# Patient Record
Sex: Female | Born: 1950
Health system: Southern US, Community
[De-identification: ages and names within clinical notes are randomized; demographics above are authoritative.]

## PROBLEM LIST (undated history)

## (undated) DIAGNOSIS — E785 Hyperlipidemia, unspecified: Secondary | ICD-10-CM

## (undated) DIAGNOSIS — K219 Gastro-esophageal reflux disease without esophagitis: Secondary | ICD-10-CM

## (undated) DIAGNOSIS — I10 Essential (primary) hypertension: Secondary | ICD-10-CM

## (undated) DIAGNOSIS — M797 Fibromyalgia: Secondary | ICD-10-CM

## (undated) DIAGNOSIS — M1990 Unspecified osteoarthritis, unspecified site: Secondary | ICD-10-CM

## (undated) DIAGNOSIS — T7840XA Allergy, unspecified, initial encounter: Secondary | ICD-10-CM

## (undated) HISTORY — DX: Hyperlipidemia, unspecified: E78.5

## (undated) HISTORY — DX: Unspecified osteoarthritis, unspecified site: M19.90

## (undated) HISTORY — DX: Gastro-esophageal reflux disease without esophagitis: K21.9

## (undated) HISTORY — PX: PARTIAL HYSTERECTOMY: SHX80

## (undated) HISTORY — PX: CARPAL TUNNEL RELEASE: SHX101

## (undated) HISTORY — PX: KNEE ARTHROSCOPY: SUR90

## (undated) HISTORY — PX: ROTATOR CUFF REPAIR: SHX139

## (undated) HISTORY — DX: Fibromyalgia: M79.7

## (undated) HISTORY — DX: Allergy, unspecified, initial encounter: T78.40XA

## (undated) HISTORY — DX: Essential (primary) hypertension: I10

---

## 1984-10-13 HISTORY — PX: ABDOMINAL HYSTERECTOMY: SHX81

## 2010-10-14 LAB — HM COLONOSCOPY: HM Colonoscopy: NORMAL

## 2013-10-13 HISTORY — PX: CERVICAL FUSION: SHX112

## 2014-03-13 LAB — HM MAMMOGRAPHY: HM Mammogram: NORMAL

## 2014-05-03 LAB — LIPID PANEL
CHOLESTEROL: 177 mg/dL (ref 0–200)
HDL: 65 mg/dL (ref 35–70)
LDL Cholesterol: 86 mg/dL
Triglycerides: 131 mg/dL (ref 40–160)

## 2014-05-03 LAB — TSH: TSH: 2.5 u[IU]/mL (ref <=5.90)

## 2015-02-15 DIAGNOSIS — Z01810 Encounter for preprocedural cardiovascular examination: Secondary | ICD-10-CM | POA: Insufficient documentation

## 2015-03-19 ENCOUNTER — Other Ambulatory Visit: Payer: Self-pay | Admitting: Internal Medicine

## 2015-03-19 ENCOUNTER — Encounter: Payer: Self-pay | Admitting: Internal Medicine

## 2015-03-19 DIAGNOSIS — I25119 Atherosclerotic heart disease of native coronary artery with unspecified angina pectoris: Secondary | ICD-10-CM | POA: Insufficient documentation

## 2015-03-19 DIAGNOSIS — M797 Fibromyalgia: Secondary | ICD-10-CM | POA: Insufficient documentation

## 2015-03-19 DIAGNOSIS — K219 Gastro-esophageal reflux disease without esophagitis: Secondary | ICD-10-CM | POA: Insufficient documentation

## 2015-03-19 DIAGNOSIS — I10 Essential (primary) hypertension: Secondary | ICD-10-CM | POA: Insufficient documentation

## 2015-03-19 DIAGNOSIS — Z981 Arthrodesis status: Secondary | ICD-10-CM | POA: Insufficient documentation

## 2015-03-19 DIAGNOSIS — E782 Mixed hyperlipidemia: Secondary | ICD-10-CM | POA: Insufficient documentation

## 2015-03-20 ENCOUNTER — Ambulatory Visit (INDEPENDENT_AMBULATORY_CARE_PROVIDER_SITE_OTHER): Payer: Medicare Other | Admitting: Internal Medicine

## 2015-03-20 ENCOUNTER — Other Ambulatory Visit: Payer: Self-pay | Admitting: Internal Medicine

## 2015-03-20 ENCOUNTER — Encounter: Payer: Self-pay | Admitting: Internal Medicine

## 2015-03-20 VITALS — BP 104/60 | HR 68 | Ht 62.0 in | Wt 164.8 lb

## 2015-03-20 DIAGNOSIS — R609 Edema, unspecified: Secondary | ICD-10-CM

## 2015-03-20 DIAGNOSIS — R600 Localized edema: Secondary | ICD-10-CM

## 2015-03-20 DIAGNOSIS — M797 Fibromyalgia: Secondary | ICD-10-CM | POA: Diagnosis not present

## 2015-03-20 DIAGNOSIS — M2550 Pain in unspecified joint: Secondary | ICD-10-CM | POA: Diagnosis not present

## 2015-03-20 DIAGNOSIS — I219 Acute myocardial infarction, unspecified: Secondary | ICD-10-CM | POA: Insufficient documentation

## 2015-03-20 DIAGNOSIS — Z9889 Other specified postprocedural states: Secondary | ICD-10-CM | POA: Insufficient documentation

## 2015-03-20 MED ORDER — TRIAMTERENE-HCTZ 37.5-25 MG PO TABS: 1.0000 | ORAL_TABLET | Freq: Every day | ORAL | Status: DC

## 2015-03-20 NOTE — Patient Instructions (Addendum)
DASH Eating Plan DASH stands for "Dietary Approaches to Stop Hypertension." The DASH eating plan is a healthy eating plan that has been shown to reduce high blood pressure (hypertension). Additional health benefits may include reducing the risk of type 2 diabetes mellitus, heart disease, and stroke. The DASH eating plan may also help with weight loss. WHAT DO I NEED TO KNOW ABOUT THE DASH EATING PLAN? For the DASH eating plan, you will follow these general guidelines:  Choose foods with a percent daily value for sodium of less than 5% (as listed on the food label).  Use salt-free seasonings or herbs instead of table salt or sea salt.  Check with your health care provider or pharmacist before using salt substitutes.  Eat lower-sodium products, often labeled as "lower sodium" or "no salt added."  Eat fresh foods.  Eat more vegetables, fruits, and low-fat dairy products.  Choose whole grains. Look for the word "whole" as the first word in the ingredient list.  Choose fish and skinless chicken or turkey more often than red meat. Limit fish, poultry, and meat to 6 oz (170 g) each day.  Limit sweets, desserts, sugars, and sugary drinks.  Choose heart-healthy fats.  Limit cheese to 1 oz (28 g) per day.  Eat more home-cooked food and less restaurant, buffet, and fast food.  Limit fried foods.  Cook foods using methods other than frying.  Limit canned vegetables. If you do use them, rinse them well to decrease the sodium.  When eating at a restaurant, ask that your food be prepared with less salt, or no salt if possible. WHAT FOODS CAN I EAT? Seek help from a dietitian for individual calorie needs. Grains Whole grain or whole wheat bread. Brown rice. Whole grain or whole wheat pasta. Quinoa, bulgur, and whole grain cereals. Low-sodium cereals. Corn or whole wheat flour tortillas. Whole grain cornbread. Whole grain crackers. Low-sodium crackers. Vegetables Fresh or frozen vegetables  (raw, steamed, roasted, or grilled). Low-sodium or reduced-sodium tomato and vegetable juices. Low-sodium or reduced-sodium tomato sauce and paste. Low-sodium or reduced-sodium canned vegetables.  Fruits All fresh, canned (in natural juice), or frozen fruits. Meat and Other Protein Products Ground beef (85% or leaner), grass-fed beef, or beef trimmed of fat. Skinless chicken or turkey. Ground chicken or turkey. Pork trimmed of fat. All fish and seafood. Eggs. Dried beans, peas, or lentils. Unsalted nuts and seeds. Unsalted canned beans. Dairy Low-fat dairy products, such as skim or 1% milk, 2% or reduced-fat cheeses, low-fat ricotta or cottage cheese, or plain low-fat yogurt. Low-sodium or reduced-sodium cheeses. Fats and Oils Tub margarines without trans fats. Light or reduced-fat mayonnaise and salad dressings (reduced sodium). Avocado. Safflower, olive, or canola oils. Natural peanut or almond butter. Other Unsalted popcorn and pretzels. The items listed above may not be a complete list of recommended foods or beverages. Contact your dietitian for more options. WHAT FOODS ARE NOT RECOMMENDED? Grains White bread. White pasta. White rice. Refined cornbread. Bagels and croissants. Crackers that contain trans fat. Vegetables Creamed or fried vegetables. Vegetables in a cheese sauce. Regular canned vegetables. Regular canned tomato sauce and paste. Regular tomato and vegetable juices. Fruits Dried fruits. Canned fruit in light or heavy syrup. Fruit juice. Meat and Other Protein Products Fatty cuts of meat. Ribs, chicken wings, bacon, sausage, bologna, salami, chitterlings, fatback, hot dogs, bratwurst, and packaged luncheon meats. Salted nuts and seeds. Canned beans with salt. Dairy Whole or 2% milk, cream, half-and-half, and cream cheese. Whole-fat or sweetened yogurt. Full-fat   cheeses or blue cheese. Nondairy creamers and whipped toppings. Processed cheese, cheese spreads, or cheese  curds. Condiments Onion and garlic salt, seasoned salt, table salt, and sea salt. Canned and packaged gravies. Worcestershire sauce. Tartar sauce. Barbecue sauce. Teriyaki sauce. Soy sauce, including reduced sodium. Steak sauce. Fish sauce. Oyster sauce. Cocktail sauce. Horseradish. Ketchup and mustard. Meat flavorings and tenderizers. Bouillon cubes. Hot sauce. Tabasco sauce. Marinades. Taco seasonings. Relishes. Fats and Oils Butter, stick margarine, lard, shortening, ghee, and bacon fat. Coconut, palm kernel, or palm oils. Regular salad dressings. Other Pickles and olives. Salted popcorn and pretzels. The items listed above may not be a complete list of foods and beverages to avoid. Contact your dietitian for more information. WHERE CAN I FIND MORE INFORMATION? National Heart, Lung, and Blood Institute: [employer] Document Released: 09/18/2011 Document Revised: 02/13/2014 Document Reviewed: 08/03/2013 [facility] Patient Information 2015 [place], [place]. This information is not intended to replace advice given to you by your health care provider. Make sure you discuss any questions you have with your health care provider.    I recommend elevating legs for 20 minutes at least twice a day to reduce edema.

## 2015-03-20 NOTE — Progress Notes (Signed)
Date:  03/20/2015   Name:  Jordan Baxter   DOB:  01/29/1951   MRN:  [id number]   Chief Complaint: Edema   History of Present Illness:  This is a 64 y.o. female who is presenting for edema.  She has been taking HCTZ for some time.  Now noticing edema in her hands first thing in the AM - it gets better as the day goes on and she moves around. She also has edema in both legs that gets worse as the day progresses.  Overnight her legs improve but the edema is never completely gone.  She denies a change in diet w/r to sodium.  She has been more active - doing housework and planting a garden.  She is on her feet all day - never takes a break to elevate.   Arthritis Presents for follow-up visit. She complains of pain, stiffness and joint swelling (in both hand upon awakening). She reports no joint warmth. Affected locations include the right foot, left foot, left knee, right knee, right hip and left hip. Pertinent negatives include no fever, rash or Raynaud's syndrome. Her past medical history is significant for chronic back pain (with previous diagnosis of fibromyalgia) and osteoarthritis. There is no history of rheumatoid arthritis.  Past treatments include an opioid, acetaminophen, exercise and activity. The treatment provided mild relief. Factors aggravating her arthritis include sitting. Compliance with prior treatments has been good. Compliance problems include medication side effects.   She has been prescribed multiple medications over the years for joint, neck pain and fibromyalgia but has not tolerated them due to side effects.  Currently on Tylenol #3 and #4 from pain management.  Review of Systems:  Review of Systems  Constitutional: Positive for activity change. Negative for fever and unexpected weight change.  HENT: Positive for dental problem.   Respiratory: Negative for cough, chest tightness, shortness of breath and wheezing.   Cardiovascular: Positive for leg swelling. Negative for chest  pain.  Gastrointestinal: Negative for abdominal pain.  Endocrine: Negative for polyuria.  Genitourinary: Negative.   Musculoskeletal: Positive for joint swelling (in both hand upon awakening), arthralgias, arthritis, stiffness, neck pain and neck stiffness.  Skin: Negative for rash.  Neurological: Negative.   Hematological: Negative.   Psychiatric/Behavioral: Negative for behavioral problems and dysphoric mood.    Patient Active Problem List   Diagnosis Date Noted  . H/O cardiac catheterization 03/20/2015  . Heart attack 03/20/2015  . CAD in native artery 03/19/2015  . Essential (primary) hypertension 03/19/2015  . Fibrositis 03/19/2015  . Gastro-esophageal reflux disease without esophagitis 03/19/2015  . Combined fat and carbohydrate induced hyperlipemia 03/19/2015  . H/O arthrodesis 03/19/2015    Prior to Admission medications   Medication Sig Start Date End Date Taking? Authorizing Provider  ACETAMINOPHEN-CODEINE #3 PO Take 1-2 tablets by mouth 3 (three) times daily as needed.   Yes Historical Provider, MD  acetaminophen-codeine (TYLENOL #4) 300-60 MG per tablet Take 1 tablet by mouth daily.   Yes Historical Provider, MD  aspirin 81 MG tablet Take 1 tablet by mouth daily.   Yes Historical Provider, MD  atorvastatin (LIPITOR) 80 MG tablet Take 1 tablet by mouth daily.   Yes Historical Provider, MD  Calcium-Vitamin D 600-200 MG-UNIT per tablet Take 1 tablet by mouth 2 (two) times daily.   Yes Historical Provider, MD  clopidogrel (PLAVIX) 75 MG tablet Take 1 tablet by mouth daily.    Historical Provider, MD  cyclobenzaprine (AMRIX) 15 MG 24 hr capsule Take 1 tablet  by mouth daily as needed.    Historical Provider, MD  isosorbide mononitrate (IMDUR) 30 MG 24 hr tablet Take 30 mg by mouth daily. 12/28/14   Historical Provider, MD  metoprolol tartrate (LOPRESSOR) 25 MG tablet Take 0.5 tablets by mouth 2 (two) times daily.   Yes Historical Provider, MD  nitroGLYCERIN (NITROSTAT) 0.4 MG  SL tablet Place 1 tablet under the tongue daily as needed.   Yes Historical Provider, MD  omeprazole (PRILOSEC) 20 MG capsule Take 1 capsule by mouth daily. 02/21/15  Yes Historical Provider, MD  Omeprazole 20 MG TBEC Take 1 tablet by mouth daily. 01/24/15 01/24/16  Historical Provider, MD  pantoprazole (PROTONIX) 40 MG tablet Take 1 tablet by mouth daily.    Historical Provider, MD  tiZANidine (ZANAFLEX) 2 MG tablet Take 2 mg by mouth at bedtime. 02/23/15  Yes Historical Provider, MD  traZODone (DESYREL) 50 MG tablet Take 1 tablet by mouth at bedtime.   Yes Historical Provider, MD  triamterene-hydrochlorothiazide (DYAZIDE) 37.5-25 MG per capsule Take 1 capsule by mouth daily.   Yes Historical Provider, MD    Allergies  Allergen Reactions  . Cephalosporins   . Ibuprofen Nausea Only  . Penicillins     Past Surgical History  Procedure Laterality Date  . Cervical fusion  2015  . Partial hysterectomy    . Rotator cuff repair Left   . Carpal tunnel release Right   . Knee arthroscopy Right     History  Substance Use Topics  . Smoking status: Former Smoker    Quit date: 03/13/2012  . Smokeless tobacco: Not on file  . Alcohol Use: No     Medication list has been reviewed and updated.  Physical Examination:  Physical Exam  Constitutional: She is oriented to person, place, and time. She appears well-developed. No distress.  Neck: Normal range of motion. Neck supple. No thyromegaly present.  Cardiovascular: Normal rate, regular rhythm and normal heart sounds.   Pulmonary/Chest: Effort normal and breath sounds normal. No respiratory distress. She has no wheezes.  Musculoskeletal: Normal range of motion. She exhibits edema (1+ to mid tibia bilaterally) and tenderness (both hands and digits).  Lymphadenopathy:    She has no cervical adenopathy.  Neurological: She is alert and oriented to person, place, and time. She has normal strength.  Reflex Scores:      Patellar reflexes are 1+ on  the right side and 1+ on the left side.      Achilles reflexes are 1+ on the right side and 1+ on the left side. Skin: Skin is warm and dry. No rash noted.  Psychiatric: She has a normal mood and affect. Her behavior is normal. Thought content normal.    BP 104/60 mmHg  Pulse 68  Ht 5' 2"  (1.575 m)  Wt 164 lb 12.8 oz (74.753 kg)  BMI 30.13 kg/m2  Assessment and Plan: 1. Fibrositis Continue acitivites as tolerated.  Continue plain tylenol as needed  2. Edema extremities Will increase maxzide to one whole tablet daily Reinforced free water intake and reduced sodium diet Elevated legs 20 minutes at least twice a day - TSH - triamterene-hydrochlorothiazide (MAXZIDE-25) 37.5-25 MG per tablet; Take 1 tablet by mouth daily.  Dispense: 90 tablet; Refill: 3  3. Joint pain Screening labs  - Rheumatoid Factor - Sed Rate (ESR) - ANA w/Reflex if Positive   Halina Maidens, MD [facility]  03/20/2015

## 2015-03-21 LAB — TSH: TSH: 1.46 u[IU]/mL (ref 0.450–4.500)

## 2015-03-21 LAB — ANA W/REFLEX IF POSITIVE: ANA: NEGATIVE

## 2015-03-21 LAB — SEDIMENTATION RATE: Sed Rate: 4 mm/hr (ref 0–40)

## 2015-03-21 LAB — RHEUMATOID FACTOR

## 2015-05-31 ENCOUNTER — Encounter: Payer: Self-pay | Admitting: Family Medicine

## 2015-05-31 ENCOUNTER — Ambulatory Visit: Payer: Medicare Other | Admitting: Family Medicine

## 2015-05-31 ENCOUNTER — Ambulatory Visit (INDEPENDENT_AMBULATORY_CARE_PROVIDER_SITE_OTHER): Payer: Medicare Other | Admitting: Family Medicine

## 2015-05-31 VITALS — BP 100/60 | HR 72 | Temp 97.8°F | Ht 62.0 in | Wt 166.8 lb

## 2015-05-31 DIAGNOSIS — J029 Acute pharyngitis, unspecified: Secondary | ICD-10-CM

## 2015-05-31 LAB — POCT RAPID STREP A (OFFICE): Rapid Strep A Screen: NEGATIVE

## 2015-05-31 NOTE — Progress Notes (Signed)
Date:  05/31/2015   Name:  Jordan Baxter   DOB:  [birth year]   MRN:  [id number]  PCP:  Bari Edward, MD    Chief Complaint: Sore Throat and Cough   History of Present Illness:  This is a 64 y.o. female with 3d hx of ST, NP cough, LG fever, rhinorrhea last week. Denies otalgia. Watching grandchildren lately with colds.  Review of Systems:  Review of Systems  HENT: Negative for ear pain, facial swelling, hearing loss, sinus pressure and trouble swallowing.   Respiratory: Negative for shortness of breath.     Patient Active Problem List   Diagnosis Date Noted  . H/O cardiac catheterization 03/20/2015  . Heart attack 03/20/2015  . CAD in native artery 03/19/2015  . Essential (primary) hypertension 03/19/2015  . Fibrositis 03/19/2015  . Gastro-esophageal reflux disease without esophagitis 03/19/2015  . Combined fat and carbohydrate induced hyperlipemia 03/19/2015  . H/O arthrodesis 03/19/2015  . Encounter for preprocedural cardiovascular examination 02/15/2015    Prior to Admission medications   Medication Sig Start Date End Date Taking? Authorizing Provider  aspirin 81 MG tablet Take 1 tablet by mouth daily.   Yes Historical Provider, MD  atorvastatin (LIPITOR) 80 MG tablet Take 1 tablet by mouth daily.   Yes Historical Provider, MD  Calcium-Vitamin D 600-200 MG-UNIT per tablet Take 1 tablet by mouth 2 (two) times daily.   Yes Historical Provider, MD  metoprolol tartrate (LOPRESSOR) 25 MG tablet Take 0.5 tablets by mouth 2 (two) times daily.   Yes Historical Provider, MD  nitroGLYCERIN (NITROSTAT) 0.4 MG SL tablet Place 1 tablet under the tongue daily as needed.   Yes Historical Provider, MD  omeprazole (PRILOSEC) 20 MG capsule Take 1 capsule by mouth daily. 02/21/15  Yes Historical Provider, MD  tiZANidine (ZANAFLEX) 2 MG tablet Take 2 mg by mouth at bedtime. 02/23/15  Yes Historical Provider, MD  traZODone (DESYREL) 50 MG tablet Take 1 tablet by mouth at bedtime.   Yes Historical  Provider, MD  triamterene-hydrochlorothiazide (MAXZIDE-25) 37.5-25 MG per tablet Take 1 tablet by mouth daily. 03/20/15  Yes Reubin Milan, MD  acetaminophen-codeine (TYLENOL #4) 300-60 MG per tablet Take 1-2 tablets by mouth 3 (three) times daily as needed. 05/14/15   Historical Provider, MD  cyclobenzaprine (AMRIX) 15 MG 24 hr capsule Take 1 tablet by mouth daily as needed.    Historical Provider, MD    Allergies  Allergen Reactions  . Cephalosporins   . Ibuprofen Nausea Only  . Penicillins     Past Surgical History  Procedure Laterality Date  . Cervical fusion  2015  . Partial hysterectomy    . Rotator cuff repair Left   . Carpal tunnel release Right   . Knee arthroscopy Right     Social History  Substance Use Topics  . Smoking status: Former Smoker    Quit date: 03/13/2012  . Smokeless tobacco: None  . Alcohol Use: No    Family History  Problem Relation Age of Onset  . CAD Mother     Medication list has been reviewed and updated.  Physical Examination: BP 100/60 mmHg  Pulse 72  Temp(Src) 97.8 F (36.6 C)  Ht  (1.575 m)  Wt 166 lb 12.8 oz (75.66 kg)  BMI 30.50 kg/m2  SpO2 96%  Physical Exam  Constitutional: She appears well-developed and well-nourished.  HENT:  Head: Normocephalic and atraumatic.  Right Ear: External ear normal.  Left Ear: External ear normal.  OP mild erythema no  exudate  Pulmonary/Chest: Effort normal and breath sounds normal.  Lymphadenopathy:    She has no cervical adenopathy.    Assessment and Plan:  1. Pharyngitis RST negative, likely viral, rec fluids, lozenges, rest, should improve within one week - POCT rapid strep A  Return if symptoms worsen or fail to improve.  Dionne Ano. Kingsley Spittle MD [facility]  05/31/2015

## 2015-06-14 ENCOUNTER — Encounter: Payer: Medicare Other | Admitting: Internal Medicine

## 2015-08-22 ENCOUNTER — Other Ambulatory Visit: Payer: Self-pay | Admitting: Internal Medicine

## 2015-08-22 ENCOUNTER — Ambulatory Visit (INDEPENDENT_AMBULATORY_CARE_PROVIDER_SITE_OTHER): Payer: Medicare Other | Admitting: Internal Medicine

## 2015-08-22 ENCOUNTER — Encounter: Payer: Self-pay | Admitting: Internal Medicine

## 2015-08-22 VITALS — BP 100/60 | HR 64 | Ht 61.0 in | Wt 164.2 lb

## 2015-08-22 DIAGNOSIS — E782 Mixed hyperlipidemia: Secondary | ICD-10-CM

## 2015-08-22 DIAGNOSIS — Z1239 Encounter for other screening for malignant neoplasm of breast: Secondary | ICD-10-CM | POA: Diagnosis not present

## 2015-08-22 DIAGNOSIS — M797 Fibromyalgia: Secondary | ICD-10-CM | POA: Diagnosis not present

## 2015-08-22 DIAGNOSIS — K219 Gastro-esophageal reflux disease without esophagitis: Secondary | ICD-10-CM | POA: Diagnosis not present

## 2015-08-22 DIAGNOSIS — Z0000 Encounter for general adult medical examination without abnormal findings: Secondary | ICD-10-CM

## 2015-08-22 DIAGNOSIS — I25119 Atherosclerotic heart disease of native coronary artery with unspecified angina pectoris: Secondary | ICD-10-CM | POA: Diagnosis not present

## 2015-08-22 DIAGNOSIS — I10 Essential (primary) hypertension: Secondary | ICD-10-CM | POA: Diagnosis not present

## 2015-08-22 LAB — POCT URINALYSIS DIPSTICK
BILIRUBIN UA: NEGATIVE
Blood, UA: NEGATIVE
GLUCOSE UA: NEGATIVE
KETONES UA: NEGATIVE
LEUKOCYTES UA: NEGATIVE
Nitrite, UA: NEGATIVE
Protein, UA: NEGATIVE
SPEC GRAV UA: 1.015
Urobilinogen, UA: 0.2
pH, UA: 6.5

## 2015-08-22 NOTE — Progress Notes (Signed)
Patient: Jordan Baxter, Female    DOB: 08/22/1951, 64 y.o.   MRN: [id number] Visit Date: 08/22/2015  Today's Provider: Bari Edward, MD   Chief Complaint  Patient presents with  . Medicare Wellness   Subjective:    Annual wellness visit Jordan Baxter is a 64 y.o. female who presents today for her Subsequent Annual Wellness Visit. She feels poorly due to chronic pain. She reports exercising some doing yardwork. She reports she is sleeping fairly well. She denies breast problems.  She is due for a Mammogram.  She does not need a Pap smear due to hysterectomy.  ----------------------------------------------------------- Back Pain This is a chronic problem. The current episode started more than 1 year ago. Associated symptoms include abdominal pain (reflux) and chest pain. Pertinent negatives include no dysuria, fever, headaches or pelvic pain. Treatments tried: She is under the care of pain management. She's had a steroid injection in her back which was not of benefit. A recent MRI suggested degenerative disease at another level. Another steroid injection will be attempted. If helpful, she may consider surgery.  Hypertension This is a chronic problem. The current episode started more than 1 year ago. The problem is unchanged. The problem is controlled. Associated symptoms include chest pain. Pertinent negatives include no headaches, palpitations or shortness of breath. There are no associated agents to hypertension. Risk factors for coronary artery disease include dyslipidemia. Past treatments include beta blockers and diuretics.  Hyperlipidemia This is a chronic problem. The current episode started more than 1 year ago. The problem is controlled. Recent lipid tests were reviewed and are normal. Associated symptoms include chest pain and myalgias. Pertinent negatives include no shortness of breath. Current antihyperlipidemic treatment includes statins. The current treatment provides significant  improvement of lipids.  Gastroesophageal Reflux She complains of abdominal pain (reflux) and chest pain. She reports no wheezing. This is a recurrent problem. The current episode started more than 1 year ago. The problem occurs occasionally. The problem has been unchanged. Pertinent negatives include no fatigue. She has tried a PPI for the symptoms. The treatment provided moderate (She has intermittent chest discomfort that she's not sure is either reflux or possibly angina. She takes omeprazole 20 daily. She has nitroglycerin but has not used it for chest pain) relief.   coronary artery disease - patient has a history of MI and stent placement. She is on aspirin, atorvastatin, and metoprolol. She has intermittent chest discomfort which she does not distinguish from reflux. She has not taken nitroglycerin when this pain occurs.  Review of Systems  Constitutional: Negative for fever, chills, appetite change, fatigue and unexpected weight change.  HENT: Negative for congestion, hearing loss, tinnitus, trouble swallowing and voice change.   Eyes: Negative for visual disturbance.  Respiratory: Positive for chest tightness. Negative for shortness of breath and wheezing.   Cardiovascular: Positive for chest pain. Negative for palpitations and leg swelling.  Gastrointestinal: Positive for abdominal pain (reflux). Negative for diarrhea and constipation.  Genitourinary: Positive for frequency. Negative for dysuria, urgency, hematuria, vaginal bleeding, vaginal discharge and pelvic pain.  Musculoskeletal: Positive for myalgias, back pain, arthralgias and neck stiffness.  Skin: Negative for color change and rash.  Allergic/Immunologic: Negative for environmental allergies and food allergies.  Neurological: Negative for syncope, light-headedness and headaches.  Hematological: Negative for adenopathy. Does not bruise/bleed easily.  Psychiatric/Behavioral: Positive for sleep disturbance. Negative for confusion  and decreased concentration.    Social History   Social History  . Marital Status: Unknown    Spouse  Name: N/A  . Number of Children: N/A  . Years of Education: N/A   Occupational History  . Not on file.   Social History Main Topics  . Smoking status: Former Smoker    Quit date: 03/13/2012  . Smokeless tobacco: Not on file  . Alcohol Use: No  . Drug Use: No  . Sexual Activity: Not on file   Other Topics Concern  . Not on file   Social History Narrative    Patient Active Problem List   Diagnosis Date Noted  . H/O cardiac catheterization 03/20/2015  . Heart attack (HCC) 03/20/2015  . CAD in native artery 03/19/2015  . Essential (primary) hypertension 03/19/2015  . Fibrositis 03/19/2015  . Gastro-esophageal reflux disease without esophagitis 03/19/2015  . Combined fat and carbohydrate induced hyperlipemia 03/19/2015  . H/O arthrodesis 03/19/2015    Past Surgical History  Procedure Laterality Date  . Cervical fusion  2015  . Partial hysterectomy    . Rotator cuff repair Left   . Carpal tunnel release Right   . Knee arthroscopy Right     Her family history includes CAD in her mother.    Previous Medications   ACETAMINOPHEN-CODEINE (TYLENOL #4) 300-60 MG PER TABLET    Take 1-2 tablets by mouth 3 (three) times daily as needed.   ASPIRIN 81 MG TABLET    Take 1 tablet by mouth daily.   ATORVASTATIN (LIPITOR) 80 MG TABLET    Take 1 tablet by mouth daily.   CALCIUM-VITAMIN D 600-200 MG-UNIT PER TABLET    Take 1 tablet by mouth 2 (two) times daily.   METOPROLOL TARTRATE (LOPRESSOR) 25 MG TABLET    Take 0.5 tablets by mouth 2 (two) times daily.   NITROGLYCERIN (NITROSTAT) 0.4 MG SL TABLET    Place 1 tablet under the tongue daily as needed.   OMEPRAZOLE (PRILOSEC) 20 MG CAPSULE    Take 1 capsule by mouth daily.   TIZANIDINE (ZANAFLEX) 4 MG TABLET    Take by mouth.   TRAZODONE (DESYREL) 50 MG TABLET    Take 1 tablet by mouth at bedtime.   TRIAMTERENE-HYDROCHLOROTHIAZIDE  (MAXZIDE-25) 37.5-25 MG PER TABLET    Take 1 tablet by mouth daily.    Patient Care Team: Reubin Milan, MD as PCP - General (Family Medicine)     Objective:   Vitals: BP 100/60 mmHg  Pulse 64  Ht  (1.549 m)  Wt 164 lb 3.2 oz (74.481 kg)  BMI 31.04 kg/m2  Physical Exam  Constitutional: She is oriented to person, place, and time. She appears well-developed and well-nourished. She appears distressed (acutely uncomfortable to movement).  HENT:  Head: Normocephalic and atraumatic.  Right Ear: Tympanic membrane and ear canal normal.  Left Ear: Tympanic membrane and ear canal normal.  Nose: Right sinus exhibits no maxillary sinus tenderness. Left sinus exhibits no maxillary sinus tenderness.  Mouth/Throat: Uvula is midline and oropharynx is clear and moist.  Eyes: Conjunctivae and EOM are normal. Right eye exhibits no discharge. Left eye exhibits no discharge. No scleral icterus.  Neck: Normal range of motion. Carotid bruit is not present. No erythema present. No thyromegaly present.  Cardiovascular: Normal rate, regular rhythm, normal heart sounds and normal pulses.   Pulmonary/Chest: Effort normal. No respiratory distress. She has no wheezes. Right breast exhibits no mass, no nipple discharge, no skin change and no tenderness. Left breast exhibits no mass, no nipple discharge, no skin change and no tenderness.  Abdominal: Soft. Bowel sounds are normal.  There is no hepatosplenomegaly. There is no tenderness. There is no CVA tenderness.  Musculoskeletal: She exhibits no edema.       Cervical back: She exhibits tenderness, pain and spasm.       Lumbar back: She exhibits tenderness, pain and spasm.  Lymphadenopathy:    She has no cervical adenopathy.    She has no axillary adenopathy.  Neurological: She is alert and oriented to person, place, and time. She has normal reflexes. No cranial nerve deficit or sensory deficit.  Skin: Skin is warm, dry and intact. No rash noted.   Psychiatric: She has a normal mood and affect. Her speech is normal and behavior is normal. Thought content normal.  Nursing note and vitals reviewed.   Activities of Daily Living In your present state of health, do you have any difficulty performing the following activities: 03/20/2015  Hearing? N  Vision? Y  Difficulty concentrating or making decisions? N  Walking or climbing stairs? Y  Dressing or bathing? Y  Doing errands, shopping? N    Fall Risk Assessment Fall Risk  08/22/2015  Falls in the past year? No     Patient reports there are safety devices in place in shower at home.   Depression Screen PHQ 2/9 Scores 08/22/2015  PHQ - 2 Score 0    Cognitive Testing - 6-CIT   Correct? Score   What year is it? yes 0 Yes = 0    No = 4  What month is it? yes 0 Yes = 0    No = 3  Remember:     [place], [address], [place]     What time is it? yes 0 Yes = 0    No = 3  Count backwards from 20 to 1 yes 0 Correct = 0    1 error = 2   More than 1 error = 4  Say the months of the year in reverse. yes 0 Correct = 0    1 error = 2   More than 1 error = 4  What address did I ask you to remember? yes 0 Correct = 0  1 error = 2    2 error = 4    3 error = 6    4 error = 8    All wrong = 10       TOTAL SCORE  0/28   Interpretation:  Normal  Normal (0-7) Abnormal (8-28)        Assessment & Plan:     Annual Wellness Visit  Reviewed patient's Family Medical History Reviewed and updated list of patient's medical providers Assessment of cognitive impairment was done Assessed patient's functional ability Established a written schedule for health screening services Health Risk Assessent Completed and Reviewed  Exercise Activities and Dietary recommendations Goals    None      Immunization History  Administered Date(s) Administered  . Influenza-Unspecified 07/22/2015  . Pneumococcal Polysaccharide-23 11/13/2009  . Tdap 05/03/2014  . Zoster 10/15/2011    Health  Maintenance  Topic Date Due  . Hepatitis C Screening  Sep 04, 1951  . HIV Screening  05/03/1966  . PAP SMEAR  05/03/1972  . INFLUENZA VACCINE  05/14/2015  . MAMMOGRAM  03/13/2016  . COLONOSCOPY  10/14/2020  . TETANUS/TDAP  05/03/2024  . ZOSTAVAX  Completed     Discussed health benefits of physical activity, and encouraged her to engage in regular exercise appropriate for her age and condition.    ------------------------------------------------------------------------------------------------------------  1. Medicare annual wellness visit, subsequent Medicare wellness measures are satisfied - POCT urinalysis dipstick  2. Essential (primary) hypertension Controlled on current medication - CBC with Differential/Platelet - Comprehensive metabolic panel - TSH  3. Gastro-esophageal reflux disease without esophagitis May need to increase the dose of omeprazole if symptoms continue  4. Fibrositis Chronic stable symptoms; followed by the pain clinic  5. Combined fat and carbohydrate induced hyperlipemia On statin therapy - Lipid panel  6. Breast cancer screening Patient will schedule mammogram in [place]  7. Coronary artery disease involving native coronary artery of native heart with angina pectoris (HCC) Continue aspirin and atorvastatin along with metoprolol Patient is encouraged to take sublingual nitroglycerin when she has substernal chest pains and follow up with cardiology if worsening  Bari Edward, MD [facility]  08/22/2015

## 2015-08-22 NOTE — Patient Instructions (Addendum)
Health Maintenance  Topic Date Due  . Hepatitis C Screening  1951-01-18  . HIV Screening  05/03/1966  . PAP SMEAR  05/03/1972  . MAMMOGRAM  03/13/2016  . INFLUENZA VACCINE  05/13/2016  . COLONOSCOPY  10/14/2020  . TETANUS/TDAP  05/03/2024  . ZOSTAVAX  Completed   Breast Self-Awareness Practicing breast self-awareness may pick up problems early, prevent significant medical complications, and possibly save your life. By practicing breast self-awareness, you can become familiar with how your breasts look and feel and if your breasts are changing. This allows you to notice changes early. It can also offer you some reassurance that your breast health is good. One way to learn what is normal for your breasts and whether your breasts are changing is to do a breast self-exam. If you find a lump or something that was not present in the past, it is best to contact your caregiver right away. Other findings that should be evaluated by your caregiver include nipple discharge, especially if it is bloody; skin changes or reddening; areas where the skin seems to be pulled in (retracted); or new lumps and bumps. Breast pain is seldom associated with cancer (malignancy), but should also be evaluated by a caregiver. HOW TO PERFORM A BREAST SELF-EXAM The best time to examine your breasts is 5-7 days after your menstrual period is over. During menstruation, the breasts are lumpier, and it may be more difficult to pick up changes. If you do not menstruate, have reached menopause, or had your uterus removed (hysterectomy), you should examine your breasts at regular intervals, such as monthly. If you are breastfeeding, examine your breasts after a feeding or after using a breast pump. Breast implants do not decrease the risk for lumps or tumors, so continue to perform breast self-exams as recommended. Talk to your caregiver about how to determine the difference between the implant and breast tissue. Also, talk about the  amount of pressure you should use during the exam. Over time, you will become more familiar with the variations of your breasts and more comfortable with the exam. A breast self-exam requires you to remove all your clothes above the waist. 1. Look at your breasts and nipples. Stand in front of a mirror in a room with good lighting. With your hands on your hips, push your hands firmly downward. Look for a difference in shape, contour, and size from one breast to the other (asymmetry). Asymmetry includes puckers, dips, or bumps. Also, look for skin changes, such as reddened or scaly areas on the breasts. Look for nipple changes, such as discharge, dimpling, repositioning, or redness. 2. Carefully feel your breasts. This is best done either in the shower or tub while using soapy water or when flat on your back. Place the arm (on the side of the breast you are examining) above your head. Use the pads (not the fingertips) of your three middle fingers on your opposite hand to feel your breasts. Start in the underarm area and use  inch (2 cm) overlapping circles to feel your breast. Use 3 different levels of pressure (light, medium, and firm pressure) at each circle before moving to the next circle. The light pressure is needed to feel the tissue closest to the skin. The medium pressure will help to feel breast tissue a little deeper, while the firm pressure is needed to feel the tissue close to the ribs. Continue the overlapping circles, moving downward over the breast until you feel your ribs below your breast. Then,  move one finger-width towards the center of the body. Continue to use the  inch (2 cm) overlapping circles to feel your breast as you move slowly up toward the collar bone (clavicle) near the base of the neck. Continue the up and down exam using all 3 pressures until you reach the middle of the chest. Do this with each breast, carefully feeling for lumps or changes. 3.  Keep a written record with  breast changes or normal findings for each breast. By writing this information down, you do not need to depend only on memory for size, tenderness, or location. Write down where you are in your menstrual cycle, if you are still menstruating. Breast tissue can have some lumps or thick tissue. However, see your caregiver if you find anything that concerns you.  SEEK MEDICAL CARE IF:  You see a change in shape, contour, or size of your breasts or nipples.   You see skin changes, such as reddened or scaly areas on the breasts or nipples.   You have an unusual discharge from your nipples.   You feel a new lump or unusually thick areas.    This information is not intended to replace advice given to you by your health care provider. Make sure you discuss any questions you have with your health care provider.   Document Released: 09/29/2005 Document Revised: 09/15/2012 Document Reviewed: 01/14/2012 Elsevier Interactive Patient Education [employer]

## 2015-08-23 LAB — COMPREHENSIVE METABOLIC PANEL
A/G RATIO: 1.8 (ref 1.1–2.5)
ALT: 19 IU/L (ref 0–32)
AST: 18 IU/L (ref 0–40)
Albumin: 4.2 g/dL (ref 3.6–4.8)
Alkaline Phosphatase: 69 IU/L (ref 39–117)
BILIRUBIN TOTAL: 0.4 mg/dL (ref 0.0–1.2)
BUN/Creatinine Ratio: 22 (ref 11–26)
BUN: 17 mg/dL (ref 8–27)
CO2: 27 mmol/L (ref 18–29)
Calcium: 9.8 mg/dL (ref 8.7–10.3)
Chloride: 97 mmol/L (ref 97–106)
Creatinine, Ser: 0.78 mg/dL (ref 0.57–1.00)
GFR, EST AFRICAN AMERICAN: 93 mL/min/{1.73_m2} (ref >59)
GFR, EST NON AFRICAN AMERICAN: 81 mL/min/{1.73_m2} (ref >59)
Globulin, Total: 2.4 g/dL (ref 1.5–4.5)
Glucose: 78 mg/dL (ref 65–99)
POTASSIUM: 5.2 mmol/L (ref 3.5–5.2)
Sodium: 140 mmol/L (ref 136–144)
TOTAL PROTEIN: 6.6 g/dL (ref 6.0–8.5)

## 2015-08-23 LAB — CBC WITH DIFFERENTIAL/PLATELET
BASOS: 1 %
Basophils Absolute: 0.1 10*3/uL (ref 0.0–0.2)
EOS (ABSOLUTE): 0.2 10*3/uL (ref 0.0–0.4)
EOS: 4 %
Hematocrit: 39.9 % (ref 34.0–46.6)
Hemoglobin: 13.4 g/dL (ref 11.1–15.9)
IMMATURE GRANS (ABS): 0 10*3/uL (ref 0.0–0.1)
Immature Granulocytes: 0 %
Lymphocytes Absolute: 2.6 10*3/uL (ref 0.7–3.1)
Lymphs: 49 %
MCH: 29.3 pg (ref 26.6–33.0)
MCHC: 33.6 g/dL (ref 31.5–35.7)
MCV: 87 fL (ref 79–97)
MONOS ABS: 0.5 10*3/uL (ref 0.1–0.9)
Monocytes: 9 %
NEUTROS ABS: 2 10*3/uL (ref 1.4–7.0)
NEUTROS PCT: 37 %
Platelets: 316 10*3/uL (ref 150–379)
RBC: 4.58 x10E6/uL (ref 3.77–5.28)
RDW: 14.3 % (ref 12.3–15.4)
WBC: 5.3 10*3/uL (ref 3.4–10.8)

## 2015-08-23 LAB — LIPID PANEL
CHOLESTEROL TOTAL: 149 mg/dL (ref 100–199)
Chol/HDL Ratio: 2.4 ratio units (ref 0.0–4.4)
HDL: 62 mg/dL (ref >39)
LDL CALC: 68 mg/dL (ref 0–99)
Triglycerides: 93 mg/dL (ref 0–149)
VLDL Cholesterol Cal: 19 mg/dL (ref 5–40)

## 2015-08-23 LAB — TSH: TSH: 1.3 u[IU]/mL (ref 0.450–4.500)

## 2016-04-23 ENCOUNTER — Ambulatory Visit (INDEPENDENT_AMBULATORY_CARE_PROVIDER_SITE_OTHER): Payer: Medicare Other | Admitting: Internal Medicine

## 2016-04-23 ENCOUNTER — Encounter: Payer: Self-pay | Admitting: Internal Medicine

## 2016-04-23 VITALS — BP 118/76 | HR 64 | Temp 98.7°F | Resp 16 | Ht 61.0 in | Wt 170.0 lb

## 2016-04-23 DIAGNOSIS — Z720 Tobacco use: Secondary | ICD-10-CM

## 2016-04-23 DIAGNOSIS — J0100 Acute maxillary sinusitis, unspecified: Secondary | ICD-10-CM | POA: Diagnosis not present

## 2016-04-23 DIAGNOSIS — I219 Acute myocardial infarction, unspecified: Secondary | ICD-10-CM | POA: Insufficient documentation

## 2016-04-23 DIAGNOSIS — F17201 Nicotine dependence, unspecified, in remission: Secondary | ICD-10-CM

## 2016-04-23 MED ORDER — LEVOFLOXACIN 500 MG PO TABS: 500.0000 mg | ORAL_TABLET | Freq: Every day | ORAL | Status: DC

## 2016-04-23 MED ORDER — FLUTICASONE PROPIONATE 50 MCG/ACT NA SUSP: 2.0000 | Freq: Every day | NASAL | Status: DC

## 2016-04-23 NOTE — Progress Notes (Signed)
Date:  04/23/2016   Name:  Jordan Baxter   DOB:  [birth year]   MRN:  [id number]   Chief Complaint: Cough Cough This is a new problem. The current episode started in the past 7 days. The problem has been unchanged. The cough is non-productive. Associated symptoms include nasal congestion, postnasal drip and a sore throat. Pertinent negatives include no chest pain, chills, eye redness, fever, rash, shortness of breath, weight loss or wheezing.  Also sore throat, watery eyes and fatigue.  Taking only Tylenol #3 for pain.  No allergy or sinus medications.  Review of Systems  Constitutional: Negative for fever, chills and weight loss.  HENT: Positive for postnasal drip and sore throat.   Eyes: Positive for discharge. Negative for redness and visual disturbance.  Respiratory: Positive for cough and chest tightness. Negative for shortness of breath and wheezing.   Cardiovascular: Negative for chest pain, palpitations and leg swelling.  Skin: Negative for rash.    Patient Active Problem List   Diagnosis Date Noted  . Tobacco use disorder, moderate, in sustained remission 04/23/2016  . H/O cardiac catheterization 03/20/2015  . Coronary artery disease involving native heart with angina pectoris (HCC) 03/19/2015  . Essential (primary) hypertension 03/19/2015  . Fibrositis 03/19/2015  . Gastro-esophageal reflux disease without esophagitis 03/19/2015  . Hyperlipidemia, mixed 03/19/2015  . H/O arthrodesis 03/19/2015    Prior to Admission medications   Medication Sig Start Date End Date Taking? Authorizing Provider  acetaminophen-codeine (TYLENOL #3) 300-30 MG tablet Take by mouth every 4 (four) hours as needed for moderate pain.   Yes Historical Provider, MD  acetaminophen-codeine (TYLENOL #4) 300-60 MG per tablet Take 1-2 tablets by mouth 3 (three) times daily as needed. 05/14/15  Yes Historical Provider, MD  aspirin 81 MG tablet Take 1 tablet by mouth daily.   Yes Historical Provider, MD    atorvastatin (LIPITOR) 80 MG tablet Take 1 tablet by mouth daily.   Yes Historical Provider, MD  Calcium-Vitamin D 600-200 MG-UNIT per tablet Take 1 tablet by mouth 2 (two) times daily.   Yes Historical Provider, MD  metoprolol tartrate (LOPRESSOR) 25 MG tablet Take 0.5 tablets by mouth 2 (two) times daily.   Yes Historical Provider, MD  nitroGLYCERIN (NITROSTAT) 0.4 MG SL tablet Place 1 tablet under the tongue daily as needed.   Yes Historical Provider, MD  omeprazole (PRILOSEC) 20 MG capsule Take by mouth. 01/24/16 01/23/17 Yes Historical Provider, MD  tiZANidine (ZANAFLEX) 4 MG tablet Take by mouth. 07/31/15  Yes Historical Provider, MD  triamterene-hydrochlorothiazide (MAXZIDE-25) 37.5-25 MG per tablet Take 1 tablet by mouth daily. 03/20/15  Yes Reubin Milan, MD    Allergies  Allergen Reactions  . Cephalosporins   . Ibuprofen Nausea Only  . Penicillins     Past Surgical History  Procedure Laterality Date  . Cervical fusion  2015  . Partial hysterectomy    . Rotator cuff repair Left   . Carpal tunnel release Right   . Knee arthroscopy Right     Social History  Substance Use Topics  . Smoking status: Former Smoker    Quit date: 03/13/2012  . Smokeless tobacco: None  . Alcohol Use: No     Medication list has been reviewed and updated.   Physical Exam  Constitutional: She is oriented to person, place, and time. She appears well-developed and well-nourished.  HENT:  Right Ear: External ear and ear canal normal.  Left Ear: Tympanic membrane, external ear and ear canal normal.  Tympanic membrane is not erythematous and not retracted.  Nose: Right sinus exhibits maxillary sinus tenderness. Right sinus exhibits no frontal sinus tenderness. Left sinus exhibits maxillary sinus tenderness. Left sinus exhibits no frontal sinus tenderness.  Mouth/Throat: Uvula is midline and mucous membranes are normal. No oral lesions. Posterior oropharyngeal erythema present. No oropharyngeal  exudate.  Cardiovascular: Normal rate, regular rhythm and normal heart sounds.   Pulmonary/Chest: Breath sounds normal. She has no wheezes. She has no rales.  Lymphadenopathy:    She has no cervical adenopathy.  Neurological: She is alert and oriented to person, place, and time.    BP 118/76 mmHg  Pulse 64  Temp(Src) 98.7 F (37.1 C) (Oral)  Resp 16  Ht 5\' 1"  (1.549 m)  Wt 170 lb (77.111 kg)  BMI 32.14 kg/m2  SpO2 95%  Assessment and Plan: 1. Acute maxillary sinusitis, recurrence not specified Continue lozenges and tylenol #3 or #4 - levofloxacin (LEVAQUIN) 500 MG tablet; Take 1 tablet (500 mg total) by mouth daily.  Dispense: 7 tablet; Refill: 0 - fluticasone (FLONASE) 50 MCG/ACT nasal spray; Place 2 sprays into both nostrils daily.  Dispense: 16 g; Refill: 0  2. Tobacco use disorder, moderate, in sustained remission   Bari Edward, MD [facility]  04/23/2016

## 2016-05-21 ENCOUNTER — Ambulatory Visit (INDEPENDENT_AMBULATORY_CARE_PROVIDER_SITE_OTHER): Payer: Medicare Other | Admitting: Internal Medicine

## 2016-05-21 ENCOUNTER — Encounter: Payer: Self-pay | Admitting: Internal Medicine

## 2016-05-21 VITALS — BP 118/78 | Temp 97.8°F | Resp 16 | Ht 61.0 in | Wt 173.4 lb

## 2016-05-21 DIAGNOSIS — J0100 Acute maxillary sinusitis, unspecified: Secondary | ICD-10-CM | POA: Diagnosis not present

## 2016-05-21 DIAGNOSIS — Z6832 Body mass index (BMI) 32.0-32.9, adult: Secondary | ICD-10-CM

## 2016-05-21 DIAGNOSIS — I25119 Atherosclerotic heart disease of native coronary artery with unspecified angina pectoris: Secondary | ICD-10-CM

## 2016-05-21 MED ORDER — SULFAMETHOXAZOLE-TRIMETHOPRIM 800-160 MG PO TABS: 1.0000 | ORAL_TABLET | Freq: Two times a day (BID) | ORAL | 0 refills | Status: DC

## 2016-05-21 MED ORDER — FLUTICASONE PROPIONATE 50 MCG/ACT NA SUSP: 2.0000 | Freq: Every day | NASAL | 0 refills | Status: DC

## 2016-05-21 NOTE — Patient Instructions (Signed)
Begin Sudafed 30 mg three times a day

## 2016-05-21 NOTE — Progress Notes (Signed)
Date:  05/21/2016   Name:  Jordan Baxter   DOB:  [birth year]   MRN:  [id number]   Chief Complaint: Sinus Problem (Lost voice and has headache ); Wheezing (productive cough ); and Nasal Congestion (Flonase works best need refill ) Sinusitis  This is a new problem. The current episode started in the past 7 days. The problem is unchanged. There has been no fever. Associated symptoms include congestion, coughing, a hoarse voice, sinus pressure and a sore throat. Pertinent negatives include no chills, headaches or shortness of breath. Past treatments include spray decongestants and antibiotics (took levaquin last month).  She complains of postnasal drip sore throat and loss of voice. She coughs up yellow green phlegm. Denies significant shortness of breath or wheezing.  CAD - she has stable mild angina that occurs at the end of her medication dosing interval. She remains active with activities outside but is not doing any regular exercise. She is quitting smoking and having her heart attack she's gained 7 pounds in the past year. She has not been following any particular diet other than trying to drink more water.  Review of Systems  Constitutional: Positive for unexpected weight change (7 lb over the past year). Negative for chills, fatigue and fever.  HENT: Positive for congestion, hoarse voice, sinus pressure and sore throat.   Eyes: Negative for visual disturbance.  Respiratory: Positive for cough. Negative for shortness of breath and wheezing.   Cardiovascular: Positive for chest pain. Negative for palpitations and leg swelling.  Gastrointestinal: Negative for abdominal pain.  Musculoskeletal: Positive for back pain.  Neurological: Negative for dizziness, syncope and headaches.    Patient Active Problem List   Diagnosis Date Noted  . Tobacco use disorder, moderate, in sustained remission 04/23/2016  . H/O cardiac catheterization 03/20/2015  . Coronary artery disease involving native heart  with angina pectoris (HCC) 03/19/2015  . Essential (primary) hypertension 03/19/2015  . Fibrositis 03/19/2015  . Gastro-esophageal reflux disease without esophagitis 03/19/2015  . Hyperlipidemia, mixed 03/19/2015  . H/O arthrodesis 03/19/2015    Prior to Admission medications   Medication Sig Start Date End Date Taking? Authorizing Provider  acetaminophen-codeine (TYLENOL #3) 300-30 MG tablet Take by mouth every 4 (four) hours as needed for moderate pain.   Yes Historical Provider, MD  acetaminophen-codeine (TYLENOL #4) 300-60 MG per tablet Take 1-2 tablets by mouth 3 (three) times daily as needed. 05/14/15  Yes Historical Provider, MD  aspirin 81 MG tablet Take 1 tablet by mouth daily.   Yes Historical Provider, MD  atorvastatin (LIPITOR) 80 MG tablet Take 1 tablet by mouth daily.   Yes Historical Provider, MD  Calcium-Vitamin D 600-200 MG-UNIT per tablet Take 1 tablet by mouth 2 (two) times daily.   Yes Historical Provider, MD  fluticasone (FLONASE) 50 MCG/ACT nasal spray Place 2 sprays into both nostrils daily. 04/23/16  Yes Reubin Milan, MD  metoprolol tartrate (LOPRESSOR) 25 MG tablet Take 0.5 tablets by mouth 2 (two) times daily.   Yes Historical Provider, MD  nitroGLYCERIN (NITROSTAT) 0.4 MG SL tablet Place 1 tablet under the tongue daily as needed.   Yes Historical Provider, MD  omeprazole (PRILOSEC) 20 MG capsule Take by mouth. 01/24/16 01/23/17 Yes Historical Provider, MD  tiZANidine (ZANAFLEX) 4 MG tablet Take by mouth. 07/31/15  Yes Historical Provider, MD  triamterene-hydrochlorothiazide (MAXZIDE-25) 37.5-25 MG per tablet Take 1 tablet by mouth daily. 03/20/15  Yes Reubin Milan, MD    Allergies  Allergen Reactions  .  Cephalosporins   . Ibuprofen Nausea Only  . Penicillins     Past Surgical History:  Procedure Laterality Date  . CARPAL TUNNEL RELEASE Right   . CERVICAL FUSION  2015  . KNEE ARTHROSCOPY Right   . PARTIAL HYSTERECTOMY    . ROTATOR CUFF REPAIR Left      Social History  Substance Use Topics  . Smoking status: Former Smoker    Quit date: 03/13/2012  . Smokeless tobacco: Not on file  . Alcohol use No     Medication list has been reviewed and updated.   Physical Exam  Constitutional: She is oriented to person, place, and time. She appears well-developed and well-nourished.  HENT:  Right Ear: External ear and ear canal normal. Tympanic membrane is not erythematous and not retracted.  Left Ear: External ear and ear canal normal. Tympanic membrane is not erythematous and not retracted.  Nose: Right sinus exhibits maxillary sinus tenderness and frontal sinus tenderness. Left sinus exhibits maxillary sinus tenderness and frontal sinus tenderness.  Mouth/Throat: Uvula is midline and mucous membranes are normal. No oral lesions. Posterior oropharyngeal erythema present. No oropharyngeal exudate.  Cardiovascular: Normal rate, regular rhythm and normal heart sounds.   Pulmonary/Chest: Breath sounds normal. She has no wheezes. She has no rales.  Lymphadenopathy:    She has no cervical adenopathy.  Neurological: She is alert and oriented to person, place, and time.    BP 118/78 (BP Location: Right Arm, Patient Position: Sitting)   Temp 97.8 F (36.6 C) (Oral)   Resp 16   Ht  (1.549 m)   Wt 173 lb 6.4 oz (78.7 kg)   SpO2 94%   BMI 32.76 kg/m   Assessment and Plan: 1. Acute maxillary sinusitis, recurrence not specified Begin Sudafed 30 mg tid x 3 days - fluticasone (FLONASE) 50 MCG/ACT nasal spray; Place 2 sprays into both nostrils daily.  Dispense: 16 g; Refill: 0 - sulfamethoxazole-trimethoprim (BACTRIM DS,SEPTRA DS) 800-160 MG tablet; Take 1 tablet by mouth 2 (two) times daily.  Dispense: 28 tablet; Refill: 0  2. BMI 32.0-32.9,adult Discussed dietary changes and regular exercise - begin slowly and work up to 30-45 min per day  3. Coronary artery disease involving native coronary artery of native heart with angina pectoris  (HCC) Stable - continue medication Strongly encourage follow up for stress testing   Bari Edward, MD [facility]  05/21/2016

## 2016-05-29 ENCOUNTER — Telehealth: Payer: Self-pay

## 2016-05-29 NOTE — Telephone Encounter (Signed)
Sounds like either an allergic reaction or photo-sensitivity.  Stop the antibiotic, take benedryl and tylenol for fever.  If redness gets worse, go to the ER.

## 2016-05-29 NOTE — Telephone Encounter (Signed)
Has temp of 100.6 today has been taking Sulfa since 05/21/2016 for sinus inf she has had for a dew weeks. Her face is burning and red. Was in and out running around yesterday. Has never taken this med but had no other reaction. What should she do? Has not taken this yet today. Has hx migraine and has headache also .[id number].

## 2016-05-29 NOTE — Telephone Encounter (Signed)
What med should she do for Abx? She sounds bad. I advised what you said and said sometimes these meds can work days after and just to all tomorrow and let me know how she feels.

## 2016-07-29 ENCOUNTER — Ambulatory Visit (INDEPENDENT_AMBULATORY_CARE_PROVIDER_SITE_OTHER): Payer: Medicare Other | Admitting: Internal Medicine

## 2016-07-29 ENCOUNTER — Encounter: Payer: Self-pay | Admitting: Internal Medicine

## 2016-07-29 VITALS — BP 143/80 | HR 68 | Resp 16 | Ht 61.0 in | Wt 173.0 lb

## 2016-07-29 DIAGNOSIS — J40 Bronchitis, not specified as acute or chronic: Secondary | ICD-10-CM

## 2016-07-29 MED ORDER — FLUTICASONE-SALMETEROL 100-50 MCG/DOSE IN AEPB: 1.0000 | INHALATION_SPRAY | Freq: Two times a day (BID) | RESPIRATORY_TRACT | 0 refills | Status: DC

## 2016-07-29 MED ORDER — LEVOFLOXACIN 500 MG PO TABS: 500.0000 mg | ORAL_TABLET | Freq: Every day | ORAL | 0 refills | Status: DC

## 2016-07-29 NOTE — Patient Instructions (Signed)
Advair - one puff twice a day for one week

## 2016-07-29 NOTE — Progress Notes (Signed)
Date:  07/29/2016   Name:  Winifred OliveLelia Villeda   DOB:  1951/08/04   MRN:  [id number]   Chief Complaint: Cough (Off and on for 3 weeks ) Cough  This is a new problem. The current episode started 1 to 4 weeks ago. The problem has been unchanged. The problem occurs every few hours. The cough is non-productive. Associated symptoms include chest pain and shortness of breath. Pertinent negatives include no chills, fever, nasal congestion, rhinorrhea or wheezing. The symptoms are aggravated by exercise and lying down. She has tried nothing for the symptoms.     Review of Systems  Constitutional: Negative for chills and fever.  HENT: Negative for rhinorrhea, sinus pressure, tinnitus and trouble swallowing.   Respiratory: Positive for cough and shortness of breath. Negative for chest tightness and wheezing.   Cardiovascular: Positive for chest pain. Negative for palpitations.  Gastrointestinal: Negative for abdominal pain.  Neurological: Negative for dizziness.    Patient Active Problem List   Diagnosis Date Noted  . BMI 32.0-32.9,adult 05/21/2016  . Tobacco use disorder, moderate, in sustained remission 04/23/2016  . H/O cardiac catheterization 03/20/2015  . Coronary artery disease involving native heart with angina pectoris (HCC) 03/19/2015  . Essential (primary) hypertension 03/19/2015  . Fibrositis 03/19/2015  . Gastro-esophageal reflux disease without esophagitis 03/19/2015  . Hyperlipidemia, mixed 03/19/2015  . H/O arthrodesis 03/19/2015    Prior to Admission medications   Medication Sig Start Date End Date Taking? Authorizing Provider  acetaminophen-codeine (TYLENOL #3) 300-30 MG tablet Take by mouth every 4 (four) hours as needed for moderate pain.   Yes Historical Provider, MD  acetaminophen-codeine (TYLENOL #4) 300-60 MG per tablet Take 1-2 tablets by mouth 3 (three) times daily as needed. 05/14/15  Yes Historical Provider, MD  aspirin 81 MG tablet Take 1 tablet by mouth daily.    Yes Historical Provider, MD  atorvastatin (LIPITOR) 80 MG tablet Take 1 tablet by mouth daily.   Yes Historical Provider, MD  Calcium-Vitamin D 600-200 MG-UNIT per tablet Take 1 tablet by mouth 2 (two) times daily.   Yes Historical Provider, MD  fluticasone (FLONASE) 50 MCG/ACT nasal spray Place 2 sprays into both nostrils daily. 05/21/16  Yes Reubin MilanLaura H Berglund, MD  metoprolol tartrate (LOPRESSOR) 25 MG tablet Take 0.5 tablets by mouth 2 (two) times daily.   Yes Historical Provider, MD  nitroGLYCERIN (NITROSTAT) 0.4 MG SL tablet Place 1 tablet under the tongue daily as needed.   Yes Historical Provider, MD  omeprazole (PRILOSEC) 20 MG capsule Take by mouth. 01/24/16 01/23/17 Yes Historical Provider, MD  sulfamethoxazole-trimethoprim (BACTRIM DS,SEPTRA DS) 800-160 MG tablet Take 1 tablet by mouth 2 (two) times daily. 05/21/16  Yes Reubin MilanLaura H Berglund, MD  tiZANidine (ZANAFLEX) 4 MG tablet Take by mouth. 07/31/15  Yes Historical Provider, MD  triamterene-hydrochlorothiazide (MAXZIDE-25) 37.5-25 MG per tablet Take 1 tablet by mouth daily. 03/20/15  Yes Reubin MilanLaura H Berglund, MD    Allergies  Allergen Reactions  . Cephalosporins   . Ibuprofen Nausea Only  . Penicillins   . Sulfa Antibiotics Photosensitivity    Past Surgical History:  Procedure Laterality Date  . CARPAL TUNNEL RELEASE Right   . CERVICAL FUSION  2015  . KNEE ARTHROSCOPY Right   . PARTIAL HYSTERECTOMY    . ROTATOR CUFF REPAIR Left     Social History  Substance Use Topics  . Smoking status: Former Smoker    Quit date: 03/13/2012  . Smokeless tobacco: Never Used  . Alcohol use No  Medication list has been reviewed and updated.   Physical Exam  Constitutional: She is oriented to person, place, and time. She appears well-developed. No distress.  HENT:  Head: Normocephalic and atraumatic.  Cardiovascular: Normal rate, regular rhythm and normal heart sounds.   Pulmonary/Chest: Effort normal. No respiratory distress. She has  decreased breath sounds. She has no wheezes. She has no rhonchi.  Musculoskeletal: Normal range of motion.  Neurological: She is alert and oriented to person, place, and time.  Skin: Skin is warm and dry. No rash noted.  Psychiatric: She has a normal mood and affect. Her behavior is normal. Thought content normal.  Nursing note and vitals reviewed.   BP (!) 143/80   Pulse 68   Resp 16   Ht 5\' 1"  (1.549 m)   Wt 173 lb (78.5 kg)   SpO2 98%   BMI 32.69 kg/m   Assessment and Plan: 1. Bronchitis - levofloxacin (LEVAQUIN) 500 MG tablet; Take 1 tablet (500 mg total) by mouth daily.  Dispense: 7 tablet; Refill: 0 - Fluticasone-Salmeterol (ADVAIR) 100-50 MCG/DOSE AEPB; Inhale 1 puff into the lungs 2 (two) times daily.  Dispense: 1 each; Refill: 0 (sample)   Bari Edward, MD [facility]  07/29/2016

## 2016-08-04 ENCOUNTER — Telehealth: Payer: Self-pay

## 2016-08-04 ENCOUNTER — Other Ambulatory Visit: Payer: Self-pay | Admitting: Internal Medicine

## 2016-08-04 MED ORDER — FLUTICASONE-SALMETEROL 100-50 MCG/DOSE IN AEPB: 1.0000 | INHALATION_SPRAY | Freq: Two times a day (BID) | RESPIRATORY_TRACT | 0 refills | Status: DC

## 2016-08-04 NOTE — Telephone Encounter (Signed)
Patient still coughing bad and needs new meds. Advair has run out.  Advised we will refill Advair and she can add Delsym but this cough may last 3-6 weeks. I asked her to call if any fever or chest pains and to add water and rest.

## 2016-08-04 NOTE — Progress Notes (Deleted)
adv

## 2016-08-22 ENCOUNTER — Ambulatory Visit (INDEPENDENT_AMBULATORY_CARE_PROVIDER_SITE_OTHER): Payer: Medicare Other | Admitting: Internal Medicine

## 2016-08-22 ENCOUNTER — Encounter: Payer: Self-pay | Admitting: Internal Medicine

## 2016-08-22 VITALS — BP 128/82 | HR 64 | Ht 61.0 in | Wt 174.0 lb

## 2016-08-22 DIAGNOSIS — I25119 Atherosclerotic heart disease of native coronary artery with unspecified angina pectoris: Secondary | ICD-10-CM

## 2016-08-22 DIAGNOSIS — I10 Essential (primary) hypertension: Secondary | ICD-10-CM | POA: Diagnosis not present

## 2016-08-22 DIAGNOSIS — F17201 Nicotine dependence, unspecified, in remission: Secondary | ICD-10-CM

## 2016-08-22 DIAGNOSIS — Z23 Encounter for immunization: Secondary | ICD-10-CM | POA: Diagnosis not present

## 2016-08-22 DIAGNOSIS — Z1231 Encounter for screening mammogram for malignant neoplasm of breast: Secondary | ICD-10-CM | POA: Diagnosis not present

## 2016-08-22 DIAGNOSIS — Z0000 Encounter for general adult medical examination without abnormal findings: Secondary | ICD-10-CM | POA: Diagnosis not present

## 2016-08-22 DIAGNOSIS — E782 Mixed hyperlipidemia: Secondary | ICD-10-CM | POA: Diagnosis not present

## 2016-08-22 DIAGNOSIS — K219 Gastro-esophageal reflux disease without esophagitis: Secondary | ICD-10-CM

## 2016-08-22 LAB — POCT URINALYSIS DIPSTICK
Bilirubin, UA: NEGATIVE
Blood, UA: NEGATIVE
Glucose, UA: NEGATIVE
KETONES UA: NEGATIVE
LEUKOCYTES UA: NEGATIVE
Nitrite, UA: NEGATIVE
PROTEIN UA: NEGATIVE
Spec Grav, UA: 1.015
Urobilinogen, UA: 0.2
pH, UA: 5

## 2016-08-22 MED ORDER — OMEPRAZOLE 20 MG PO CPDR: 20.0000 mg | DELAYED_RELEASE_CAPSULE | Freq: Every day | ORAL | 3 refills | Status: DC

## 2016-08-22 NOTE — Patient Instructions (Addendum)
Pneumococcal Conjugate Vaccine (PCV13)  1. Why get vaccinated? Vaccination can protect both children and adults from pneumococcal disease. Pneumococcal disease is caused by bacteria that can spread from person to person through close contact. It can cause ear infections, and it can also lead to more serious infections of the:  Lungs (pneumonia),  Blood (bacteremia), and  Covering of the brain and spinal cord (meningitis). Pneumococcal pneumonia is most common among adults. Pneumococcal meningitis can cause deafness and brain damage, and it kills about 1 child in 10 who get it. Anyone can get pneumococcal disease, but children under 65 years of age and adults 65 years and older, people with certain medical conditions, and cigarette smokers are at the highest risk. Before there was a vaccine, the [place] saw:  more than 700 cases of meningitis,  about 13,000 blood infections,  about 5 million ear infections, and  about 200 deaths in children under 5 each year from pneumococcal disease. Since vaccine became available, severe pneumococcal disease in these children has fallen by 88%. About 18,000 older adults die of pneumococcal disease each year in the [place]. Treatment of pneumococcal infections with penicillin and other drugs is not as effective as it used to be, because some strains of the disease have become resistant to these drugs. This makes prevention of the disease, through vaccination, even more important. 2. PCV13 vaccine Pneumococcal conjugate vaccine (called PCV13) protects against 13 types of pneumococcal bacteria. PCV13 is routinely given to children at 2, 4, 6, and 65-74 months of age. It is also recommended for children and adults 65 to 65 years of age with certain health conditions, and for all adults 65 years of age and older. Your doctor can give you details. 3. Some people should not get this vaccine Anyone who has ever had a life-threatening allergic reaction  to a dose of this vaccine, to an earlier pneumococcal vaccine called PCV7, or to any vaccine containing diphtheria toxoid (for example, DTaP), should not get PCV13. Anyone with a severe allergy to any component of PCV13 should not get the vaccine. Tell your doctor if the person being vaccinated has any severe allergies. If the person scheduled for vaccination is not feeling well, your healthcare provider might decide to reschedule the shot on another day. 4. Risks of a vaccine reaction With any medicine, including vaccines, there is a chance of reactions. These are usually mild and go away on their own, but serious reactions are also possible. Problems reported following PCV13 varied by age and dose in the series. The most common problems reported among children were:  About half became drowsy after the shot, had a temporary loss of appetite, or had redness or tenderness where the shot was given.  About 1 out of 3 had swelling where the shot was given.  About 1 out of 3 had a mild fever, and about 1 in 20 had a fever over 102.55F.  Up to about 8 out of 10 became fussy or irritable. Adults have reported pain, redness, and swelling where the shot was given; also mild fever, fatigue, headache, chills, or muscle pain. Young children who get PCV13 along with inactivated flu vaccine at the same time may be at increased risk for seizures caused by fever. Ask your doctor for more information. Problems that could happen after any vaccine:  People sometimes faint after a medical procedure, including vaccination. Sitting or lying down for about 15 minutes can help prevent fainting, and injuries caused by a fall.  Tell your doctor if you feel dizzy, or have vision changes or ringing in the ears.  Some older children and adults get severe pain in the shoulder and have difficulty moving the arm where a shot was given. This happens very rarely.  Any medication can cause a severe allergic reaction. Such  reactions from a vaccine are very rare, estimated at about 1 in a million doses, and would happen within a few minutes to a few hours after the vaccination. As with any medicine, there is a very small chance of a vaccine causing a serious injury or death. The safety of vaccines is always being monitored. For more information, visit: http://floyd.org/ 5. What if there is a serious reaction? What should I look for?  Look for anything that concerns you, such as signs of a severe allergic reaction, very high fever, or unusual behavior. Signs of a severe allergic reaction can include hives, swelling of the face and throat, difficulty breathing, a fast heartbeat, dizziness, and weakness-usually within a few minutes to a few hours after the vaccination. What should I do?  If you think it is a severe allergic reaction or other emergency that can't wait, call 9-1-1 or get the person to the nearest hospital. Otherwise, call your doctor. Reactions should be reported to the Vaccine Adverse Event Reporting System (VAERS). Your doctor should file this report, or you can do it yourself through the VAERS web site at www.vaers.LAgents.no, or by calling 1-925-527-8836. VAERS does not give medical advice. 6. The National Vaccine Injury Compensation Program The [employer] Vaccine Injury Compensation Program (VICP) is a federal program that was created to compensate people who may have been injured by certain vaccines. Persons who believe they may have been injured by a vaccine can learn about the program and about filing a claim by calling 1-918-122-9382 or visiting the VICP website at SpiritualWord.at. There is a time limit to file a claim for compensation. 7. How can I learn more?  Ask your healthcare provider. He or she can give you the vaccine package insert or suggest other sources of information.  Call your local or state health department.  Contact the Centers for Disease Control and  Prevention (CDC):  Call 619-221-2273 (1-800-CDC-INFO) or  Visit CDC's website at PicCapture.uy Vaccine Information Statement PCV13 Vaccine (08/17/2014)   This information is not intended to replace advice given to you by your health care provider. Make sure you discuss any questions you have with your health care provider.   Document Released: 07/27/2006 Document Revised: 10/20/2014 Document Reviewed: 08/24/2014 Elsevier Interactive Patient Education 2016 [employer].  Health Maintenance  Topic Date Due  . Hepatitis C Screening  11/03/1950  . HIV Screening  05/03/1966  . PAP SMEAR  05/03/1972  . PNA vac Low Risk Adult (1 of 2 - PCV13) 05/03/2016  . MAMMOGRAM  03/18/2018  . COLONOSCOPY  10/14/2020  . TETANUS/TDAP  05/03/2024  . INFLUENZA VACCINE  Addressed  . DEXA SCAN  Completed  . ZOSTAVAX  Completed

## 2016-08-22 NOTE — Progress Notes (Signed)
Patient: Jordan Baxter, Female    DOB: 03-Jan-1951, 65 y.o.   MRN: [id number] Visit Date: 08/22/2016  Today's Provider: Bari Edward, MD   Chief Complaint  Patient presents with  . medicare annual wellness   Subjective:    Annual wellness visit Jordan Baxter is a 65 y.o. female who presents today for her Subsequent Annual Wellness Visit. She feels well. She reports exercising regularly. She reports she is sleeping well.   ----------------------------------------------------------- Hypertension  This is a chronic problem. The problem is unchanged. The problem is controlled. Associated symptoms include chest pain. Pertinent negatives include no headaches, palpitations or shortness of breath. There are no associated agents to hypertension.  Hyperlipidemia  This is a chronic problem. The problem is controlled. Recent lipid tests were reviewed and are normal. Associated symptoms include chest pain. Pertinent negatives include no shortness of breath. Current antihyperlipidemic treatment includes statins.  Gastroesophageal Reflux  She complains of chest pain and heartburn. She reports no abdominal pain, no coughing, no dysphagia or no wheezing. This is a recurrent problem. The problem occurs occasionally. The problem has been gradually improving. Pertinent negatives include no fatigue. She has tried a PPI for the symptoms. The treatment provided significant relief.  CAD - seen this year by Cardiology.  Stress done was done and showed diastolic dysfunction.  No medication changes were made.  Review of Systems  Constitutional: Negative for chills, fatigue and fever.  HENT: Negative for congestion, hearing loss, tinnitus, trouble swallowing and voice change.   Eyes: Negative for visual disturbance.  Respiratory: Negative for cough, chest tightness, shortness of breath and wheezing.   Cardiovascular: Positive for chest pain. Negative for palpitations and leg swelling.  Gastrointestinal:  Positive for heartburn. Negative for abdominal pain, constipation, diarrhea, dysphagia and vomiting.  Endocrine: Negative for polydipsia and polyuria.  Genitourinary: Negative for dysuria, frequency, genital sores, vaginal bleeding and vaginal discharge.  Musculoskeletal: Negative for arthralgias, gait problem and joint swelling.  Skin: Negative for color change and rash.  Neurological: Negative for dizziness, tremors, light-headedness and headaches.  Hematological: Negative for adenopathy. Does not bruise/bleed easily.  Psychiatric/Behavioral: Negative for dysphoric mood and sleep disturbance. The patient is not nervous/anxious.     Social History   Social History  . Marital status: Unknown    Spouse name: N/A  . Number of children: N/A  . Years of education: N/A   Occupational History  . Not on file.   Social History Main Topics  . Smoking status: Former Smoker    Quit date: 03/13/2012  . Smokeless tobacco: Never Used  . Alcohol use No  . Drug use: No  . Sexual activity: Not on file   Other Topics Concern  . Not on file   Social History Narrative  . No narrative on file    Patient Active Problem List   Diagnosis Date Noted  . BMI 32.0-32.9,adult 05/21/2016  . Tobacco use disorder, moderate, in sustained remission 04/23/2016  . H/O cardiac catheterization 03/20/2015  . Coronary artery disease involving native heart with angina pectoris (HCC) 03/19/2015  . Essential (primary) hypertension 03/19/2015  . Fibrositis 03/19/2015  . Gastro-esophageal reflux disease without esophagitis 03/19/2015  . Hyperlipidemia, mixed 03/19/2015  . H/O arthrodesis 03/19/2015    Past Surgical History:  Procedure Laterality Date  . CARPAL TUNNEL RELEASE Right   . CERVICAL FUSION  2015  . KNEE ARTHROSCOPY Right   . PARTIAL HYSTERECTOMY    . ROTATOR CUFF REPAIR Left     Her family history  includes CAD in her mother.     Previous Medications   ACETAMINOPHEN-CODEINE (TYLENOL #3)  300-30 MG TABLET    Take by mouth every 4 (four) hours as needed for moderate pain.   ACETAMINOPHEN-CODEINE (TYLENOL #4) 300-60 MG PER TABLET    Take 1-2 tablets by mouth 3 (three) times daily as needed.   ASPIRIN 81 MG TABLET    Take 1 tablet by mouth daily.   ATORVASTATIN (LIPITOR) 80 MG TABLET    Take 1 tablet by mouth daily.   CALCIUM-VITAMIN D 600-200 MG-UNIT PER TABLET    Take 1 tablet by mouth 2 (two) times daily.   FLUTICASONE (FLONASE) 50 MCG/ACT NASAL SPRAY    Place 2 sprays into both nostrils daily.   FLUTICASONE-SALMETEROL (ADVAIR) 100-50 MCG/DOSE AEPB    Inhale 1 puff into the lungs 2 (two) times daily.   METOPROLOL TARTRATE (LOPRESSOR) 25 MG TABLET    Take 0.5 tablets by mouth 2 (two) times daily.   NITROGLYCERIN (NITROSTAT) 0.4 MG SL TABLET    Place 1 tablet under the tongue daily as needed.   OMEPRAZOLE (PRILOSEC) 20 MG CAPSULE    Take by mouth.   TIZANIDINE (ZANAFLEX) 4 MG TABLET    Take 4 mg by mouth 3 (three) times daily.    TRIAMTERENE-HYDROCHLOROTHIAZIDE (MAXZIDE-25) 37.5-25 MG PER TABLET    Take 1 tablet by mouth daily.    Patient Care Team: Reubin MilanLaura H Rumaysa Sabatino, MD as PCP - General (Family Medicine)      Objective:   Vitals: BP 128/82 (BP Location: Left Arm, Patient Position: Sitting, Cuff Size: Large)   Pulse 64   Ht 5\' 1"  (1.549 m)   Wt 174 lb (78.9 kg)   SpO2 95%   BMI 32.88 kg/m   Physical Exam  Constitutional: She is oriented to person, place, and time. She appears well-developed and well-nourished. No distress.  HENT:  Head: Normocephalic and atraumatic.  Right Ear: Tympanic membrane and ear canal normal.  Left Ear: Tympanic membrane and ear canal normal.  Nose: Right sinus exhibits no maxillary sinus tenderness. Left sinus exhibits no maxillary sinus tenderness.  Mouth/Throat: Uvula is midline and oropharynx is clear and moist.  Eyes: Conjunctivae and EOM are normal. Right eye exhibits no discharge. Left eye exhibits no discharge. No scleral icterus.    Neck: No spinous process tenderness present. Carotid bruit is not present. No erythema present. No thyromegaly present.  Cardiovascular: Normal rate, regular rhythm, normal heart sounds and normal pulses.   Pulmonary/Chest: Effort normal. No respiratory distress. She has no wheezes. Right breast exhibits no mass, no nipple discharge, no skin change and no tenderness. Left breast exhibits no mass, no nipple discharge, no skin change and no tenderness.  Abdominal: Soft. Bowel sounds are normal. There is no hepatosplenomegaly. There is no tenderness. There is no CVA tenderness.  Musculoskeletal:       Cervical back: She exhibits tenderness.       Lumbar back: She exhibits decreased range of motion and tenderness.  Lymphadenopathy:    She has no cervical adenopathy.    She has no axillary adenopathy.  Neurological: She is alert and oriented to person, place, and time. She has normal strength and normal reflexes. No cranial nerve deficit or sensory deficit. Gait normal.  Skin: Skin is warm, dry and intact. No rash noted.  Inflamed tender skin tag right neck   Psychiatric: She has a normal mood and affect. Her speech is normal and behavior is normal. Thought content normal.  Nursing note and vitals reviewed.   Activities of Daily Living In your present state of health, do you have any difficulty performing the following activities: 08/22/2016 04/23/2016  Hearing? N N  Vision? N N  Difficulty concentrating or making decisions? N N  Walking or climbing stairs? Y N  Dressing or bathing? N N  Doing errands, shopping? N N  Preparing Food and eating ? N -  Using the Toilet? N -  In the past six months, have you accidently leaked urine? N -  Do you have problems with loss of bowel control? N -  Managing your Medications? N -  Managing your Finances? N -  Housekeeping or managing your Housekeeping? N -  Some recent data might be hidden    Fall Risk Assessment Fall Risk  08/22/2016 08/22/2015   Falls in the past year? No No      Depression Screen PHQ 2/9 Scores 08/22/2016 08/22/2015  PHQ - 2 Score 0 0     6CIT Screen 08/22/2016  What Year? 0 points  What month? 0 points  What time? 0 points  Count back from 20 0 points  Months in reverse 0 points  Repeat phrase 0 points  Total Score 0      Medicare Annual Wellness Visit Summary:  Reviewed patient's Family Medical History Reviewed and updated list of patient's medical providers Assessment of cognitive impairment was done Assessed patient's functional ability Established a written schedule for health screening services Health Risk Assessent Completed and Reviewed  Exercise Activities and Dietary recommendations Goals    None      Immunization History  Administered Date(s) Administered  . Influenza-Unspecified 07/22/2015  . Pneumococcal Polysaccharide-23 11/13/2009  . Tdap 05/03/2014  . Zoster 10/15/2011    Health Maintenance  Topic Date Due  . Hepatitis C Screening  June 28, 1951  . HIV Screening  05/03/1966  . PAP SMEAR  05/03/1972  . PNA vac Low Risk Adult (1 of 2 - PCV13) 05/03/2016  . INFLUENZA VACCINE  05/13/2016  . MAMMOGRAM  03/18/2018  . COLONOSCOPY  10/14/2020  . TETANUS/TDAP  05/03/2024  . DEXA SCAN  Completed  . ZOSTAVAX  Completed    Discussed health benefits of physical activity, and encouraged her to engage in regular exercise appropriate for her age and condition.    ------------------------------------------------------------------------------------------------------------  Assessment & Plan:  1. Medicare annual wellness visit, subsequent Measures satisfied Consult Dermatology for inflamed skin tag - POCT urinalysis dipstick  2. Essential (primary) hypertension controlled - Comprehensive metabolic panel - TSH  3. Gastro-esophageal reflux disease without esophagitis Stable on PPI - omeprazole (PRILOSEC) 20 MG capsule; Take 1 capsule (20 mg total) by mouth daily.   Dispense: 90 capsule; Refill: 3 - CBC with Differential/Platelet  4. Hyperlipidemia, mixed On statin therapy - Lipid panel  5. Tobacco use disorder, moderate, in sustained remission Doing well - will work on diet to avoid additional weight gain  6. Coronary artery disease involving native coronary artery of native heart with angina pectoris (HCC) Stable; followed by Cardiolgoy  7. Need for pneumococcal vaccination - Pneumococcal conjugate vaccine 13-valent IM  8. Encounter for screening mammogram for breast cancer - MM DIGITAL SCREENING BILATERAL; Future   Bari Edward, MD [facility]  08/22/2016

## 2016-08-23 LAB — COMPREHENSIVE METABOLIC PANEL
ALT: 22 IU/L (ref 0–32)
AST: 17 IU/L (ref 0–40)
Albumin/Globulin Ratio: 1.5 (ref 1.2–2.2)
Albumin: 4.4 g/dL (ref 3.6–4.8)
Alkaline Phosphatase: 83 IU/L (ref 39–117)
BUN/Creatinine Ratio: 20 (ref 12–28)
BUN: 18 mg/dL (ref 8–27)
Bilirubin Total: 0.3 mg/dL (ref 0.0–1.2)
CALCIUM: 9.7 mg/dL (ref 8.7–10.3)
CO2: 27 mmol/L (ref 18–29)
CREATININE: 0.88 mg/dL (ref 0.57–1.00)
Chloride: 99 mmol/L (ref 96–106)
GFR, EST AFRICAN AMERICAN: 80 mL/min/{1.73_m2} (ref >59)
GFR, EST NON AFRICAN AMERICAN: 69 mL/min/{1.73_m2} (ref >59)
GLUCOSE: 84 mg/dL (ref 65–99)
Globulin, Total: 2.9 g/dL (ref 1.5–4.5)
Potassium: 5.3 mmol/L — ABNORMAL HIGH (ref 3.5–5.2)
Sodium: 141 mmol/L (ref 134–144)
TOTAL PROTEIN: 7.3 g/dL (ref 6.0–8.5)

## 2016-08-23 LAB — CBC WITH DIFFERENTIAL/PLATELET
BASOS ABS: 0 10*3/uL (ref 0.0–0.2)
BASOS: 1 %
EOS (ABSOLUTE): 0.2 10*3/uL (ref 0.0–0.4)
Eos: 4 %
Hematocrit: 39.8 % (ref 34.0–46.6)
Hemoglobin: 13.3 g/dL (ref 11.1–15.9)
IMMATURE GRANS (ABS): 0 10*3/uL (ref 0.0–0.1)
IMMATURE GRANULOCYTES: 0 %
LYMPHS: 43 %
Lymphocytes Absolute: 2.6 10*3/uL (ref 0.7–3.1)
MCH: 30 pg (ref 26.6–33.0)
MCHC: 33.4 g/dL (ref 31.5–35.7)
MCV: 90 fL (ref 79–97)
Monocytes Absolute: 0.6 10*3/uL (ref 0.1–0.9)
Monocytes: 10 %
NEUTROS PCT: 42 %
Neutrophils Absolute: 2.6 10*3/uL (ref 1.4–7.0)
PLATELETS: 310 10*3/uL (ref 150–379)
RBC: 4.44 x10E6/uL (ref 3.77–5.28)
RDW: 14.2 % (ref 12.3–15.4)
WBC: 6.1 10*3/uL (ref 3.4–10.8)

## 2016-08-23 LAB — LIPID PANEL
CHOL/HDL RATIO: 2.8 ratio (ref 0.0–4.4)
Cholesterol, Total: 174 mg/dL (ref 100–199)
HDL: 63 mg/dL (ref >39)
LDL Calculated: 79 mg/dL (ref 0–99)
TRIGLYCERIDES: 158 mg/dL — AB (ref 0–149)
VLDL CHOLESTEROL CAL: 32 mg/dL (ref 5–40)

## 2016-08-23 LAB — TSH: TSH: 1.97 u[IU]/mL (ref 0.450–4.500)

## 2017-02-19 ENCOUNTER — Ambulatory Visit (INDEPENDENT_AMBULATORY_CARE_PROVIDER_SITE_OTHER): Payer: Medicare Other | Admitting: Internal Medicine

## 2017-02-19 ENCOUNTER — Encounter: Payer: Self-pay | Admitting: Internal Medicine

## 2017-02-19 VITALS — BP 122/84 | HR 68 | Ht 61.0 in | Wt 170.4 lb

## 2017-02-19 DIAGNOSIS — Z1159 Encounter for screening for other viral diseases: Secondary | ICD-10-CM | POA: Diagnosis not present

## 2017-02-19 DIAGNOSIS — R0602 Shortness of breath: Secondary | ICD-10-CM | POA: Diagnosis not present

## 2017-02-19 DIAGNOSIS — N2889 Other specified disorders of kidney and ureter: Secondary | ICD-10-CM

## 2017-02-19 DIAGNOSIS — I10 Essential (primary) hypertension: Secondary | ICD-10-CM

## 2017-02-19 DIAGNOSIS — R600 Localized edema: Secondary | ICD-10-CM | POA: Diagnosis not present

## 2017-02-19 MED ORDER — TRIAMTERENE-HCTZ 37.5-25 MG PO TABS: 1.0000 | ORAL_TABLET | Freq: Every day | ORAL | 3 refills | Status: DC

## 2017-02-19 MED ORDER — FLUTICASONE-SALMETEROL 100-50 MCG/DOSE IN AEPB: 1.0000 | INHALATION_SPRAY | Freq: Two times a day (BID) | RESPIRATORY_TRACT | 12 refills | Status: DC

## 2017-02-19 MED ORDER — FLUTICASONE PROPIONATE 50 MCG/ACT NA SUSP: 2.0000 | Freq: Every day | NASAL | 12 refills | Status: DC

## 2017-02-19 NOTE — Progress Notes (Signed)
Date:  02/19/2017   Name:  Jordan Baxter   DOB:  12/20/1950   MRN:  [id number]   Chief Complaint: Hypertension (Also, Wants refill on advair inhaler.) and COPD Hypertension  This is a chronic problem. The problem is controlled. Associated symptoms include shortness of breath. Pertinent negatives include no chest pain or palpitations. There are no associated agents to hypertension.  Shortness of Breath  This is a chronic problem. The problem occurs intermittently. The problem has been unchanged. Pertinent negatives include no chest pain, fever or wheezing. The symptoms are aggravated by smoke. Risk factors include smoking.  Renal mass - she had an MRI for back pain in April.  This showed renal cysts (laterality not mentioned) and a 1 cm lesion in upper pole of left kidney.   Review of Systems  Constitutional: Negative for chills, fatigue and fever.  Respiratory: Positive for shortness of breath. Negative for chest tightness and wheezing.   Cardiovascular: Negative for chest pain and palpitations.  Genitourinary: Negative for dysuria and hematuria.  Musculoskeletal: Positive for back pain.    Patient Active Problem List   Diagnosis Date Noted  . BMI 32.0-32.9,adult 05/21/2016  . Tobacco use disorder, moderate, in sustained remission 04/23/2016  . H/O cardiac catheterization 03/20/2015  . Coronary artery disease involving native heart with angina pectoris (HCC) 03/19/2015  . Essential (primary) hypertension 03/19/2015  . Fibrositis 03/19/2015  . Gastro-esophageal reflux disease without esophagitis 03/19/2015  . Hyperlipidemia, mixed 03/19/2015  . H/O arthrodesis 03/19/2015    Prior to Admission medications   Medication Sig Start Date End Date Taking? Authorizing Provider  acetaminophen-codeine (TYLENOL #3) 300-30 MG tablet Take by mouth every 4 (four) hours as needed for moderate pain.   Yes [provider]  acetaminophen-codeine (TYLENOL #4) 300-60 MG per tablet Take  1-2 tablets by mouth 3 (three) times daily as needed. 05/14/15  Yes [provider]  aspirin 81 MG tablet Take 1 tablet by mouth daily.   Yes [provider]  atorvastatin (LIPITOR) 80 MG tablet Take 1 tablet by mouth daily.   Yes [provider]  Calcium-Vitamin D 600-200 MG-UNIT per tablet Take 1 tablet by mouth 2 (two) times daily.   Yes [provider]  fluticasone (FLONASE) 50 MCG/ACT nasal spray Place 2 sprays into both nostrils daily. 05/21/16  Yes Reubin Milan, MD  metoprolol tartrate (LOPRESSOR) 25 MG tablet Take 0.5 tablets by mouth 2 (two) times daily.   Yes [provider]  nitroGLYCERIN (NITROSTAT) 0.4 MG SL tablet Place 1 tablet under the tongue daily as needed.   Yes [provider]  omeprazole (PRILOSEC) 20 MG capsule Take 1 capsule (20 mg total) by mouth daily. 08/22/16 08/22/17 Yes Reubin Milan, MD  tiZANidine (ZANAFLEX) 4 MG tablet Take 4 mg by mouth 3 (three) times daily.  07/31/15  Yes [provider]  triamterene-hydrochlorothiazide (MAXZIDE-25) 37.5-25 MG per tablet Take 1 tablet by mouth daily. 03/20/15  Yes Reubin Milan, MD  Fluticasone-Salmeterol (ADVAIR) 100-50 MCG/DOSE AEPB Inhale 1 puff into the lungs 2 (two) times daily. Patient not taking: Reported on 02/19/2017 07/29/16   Reubin Milan, MD  lidocaine (LIDODERM) 5 % Place 1 patch onto the skin daily. 01/22/17   [provider]    Allergies  Allergen Reactions  . Cephalosporins   . Ibuprofen Nausea Only  . Penicillins   . Sulfa Antibiotics Photosensitivity    Past Surgical History:  Procedure Laterality Date  . CARPAL TUNNEL RELEASE  Right   . CERVICAL FUSION  2015  . KNEE ARTHROSCOPY Right   . PARTIAL HYSTERECTOMY    . ROTATOR CUFF REPAIR Left     Social History  Substance Use Topics  . Smoking status: Former Smoker    Quit date: 03/13/2012  . Smokeless tobacco: Never Used  . Alcohol use No     Medication list has  been reviewed and updated.   Physical Exam  Constitutional: She is oriented to person, place, and time. She appears well-developed. No distress.  HENT:  Head: Normocephalic and atraumatic.  Neck: Normal range of motion. Neck supple.  Cardiovascular: Normal rate, regular rhythm and normal heart sounds.   Pulmonary/Chest: Effort normal and breath sounds normal. No respiratory distress. She has no wheezes. She has no rales.  Musculoskeletal: She exhibits no edema or tenderness.  Neurological: She is alert and oriented to person, place, and time.  Skin: Skin is warm and dry. No rash noted.  Psychiatric: She has a normal mood and affect. Her behavior is normal. Thought content normal.  Nursing note and vitals reviewed.   BP 122/84 (BP Location: Left Arm, Patient Position: Sitting, Cuff Size: Normal)   Pulse 68   Ht 5\' 1"  (1.549 m)   Wt 170 lb 6.4 oz (77.3 kg)   SpO2 96%   BMI 32.20 kg/m   Assessment and Plan: 1. Essential (primary) hypertension controlled - Basic metabolic panel  2. Edema extremities - triamterene-hydrochlorothiazide (MAXZIDE-25) 37.5-25 MG tablet; Take 1 tablet by mouth daily.  Dispense: 90 tablet; Refill: 3  3. Need for hepatitis C screening test - Hepatitis C antibody  4. Shortness of breath - Fluticasone-Salmeterol (ADVAIR) 100-50 MCG/DOSE AEPB; Inhale 1 puff into the lungs 2 (two) times daily.  Dispense: 1 each; Refill: 12 - fluticasone (FLONASE) 50 MCG/ACT nasal spray; Place 2 sprays into both nostrils daily.  Dispense: 16 g; Refill: 12  5. Renal mass, left Order tomorrow at [facility] - CT RENAL ABD W/WO   Meds ordered this encounter  Medications  . Fluticasone-Salmeterol (ADVAIR) 100-50 MCG/DOSE AEPB    Sig: Inhale 1 puff into the lungs 2 (two) times daily.    Dispense:  1 each    Refill:  12  . triamterene-hydrochlorothiazide (MAXZIDE-25) 37.5-25 MG tablet    Sig: Take 1 tablet by mouth daily.    Dispense:  90 tablet    Refill:  3  . DISCONTD:  fluticasone (FLONASE) 50 MCG/ACT nasal spray    Sig: Place 2 sprays into both nostrils daily.    Dispense:  16 g    Refill:  12  . fluticasone (FLONASE) 50 MCG/ACT nasal spray    Sig: Place 2 sprays into both nostrils daily.    Dispense:  16 g    Refill:  12    Bari Edward, MD [facility]  02/19/2017

## 2017-02-19 NOTE — Patient Instructions (Signed)
[facility] in [place] will call you with an appointment to further evaluate you kidneys.

## 2017-02-20 LAB — BASIC METABOLIC PANEL
BUN/Creatinine Ratio: 28 (ref 12–28)
BUN: 18 mg/dL (ref 8–27)
CO2: 28 mmol/L (ref 18–29)
Calcium: 9.4 mg/dL (ref 8.7–10.3)
Chloride: 102 mmol/L (ref 96–106)
Creatinine, Ser: 0.64 mg/dL (ref 0.57–1.00)
GFR, EST AFRICAN AMERICAN: 108 mL/min/{1.73_m2} (ref >59)
GFR, EST NON AFRICAN AMERICAN: 94 mL/min/{1.73_m2} (ref >59)
Glucose: 85 mg/dL (ref 65–99)
POTASSIUM: 4.6 mmol/L (ref 3.5–5.2)
SODIUM: 144 mmol/L (ref 134–144)

## 2017-02-20 LAB — HEPATITIS C ANTIBODY: Hep C Virus Ab: <0.1 s/co ratio (ref 0.0–0.9)

## 2017-02-20 NOTE — Progress Notes (Signed)
Pt informed of NORMAL kidney function and Hep C Negative

## 2017-03-03 ENCOUNTER — Other Ambulatory Visit: Payer: Self-pay

## 2017-03-03 DIAGNOSIS — R0602 Shortness of breath: Secondary | ICD-10-CM

## 2017-03-03 DIAGNOSIS — R600 Localized edema: Secondary | ICD-10-CM

## 2017-03-03 MED ORDER — TRIAMTERENE-HCTZ 37.5-25 MG PO TABS: 1.0000 | ORAL_TABLET | Freq: Every day | ORAL | 3 refills | Status: DC

## 2017-03-03 MED ORDER — FLUTICASONE-SALMETEROL 100-50 MCG/DOSE IN AEPB: 1.0000 | INHALATION_SPRAY | Freq: Two times a day (BID) | RESPIRATORY_TRACT | 12 refills | Status: DC

## 2017-07-02 ENCOUNTER — Telehealth: Payer: Self-pay

## 2017-07-02 NOTE — Telephone Encounter (Signed)
[facility] called to request patient's recent CMP labs- Faxed over last CMP which was from November of 2017. Sent labs to FAX# [phone].

## 2017-07-21 ENCOUNTER — Telehealth: Payer: Self-pay | Admitting: Internal Medicine

## 2017-07-21 NOTE — Telephone Encounter (Signed)
Called pt to resched for Annual Wellness Visit with Nurse Health Advisor. C/b #  [phone]  Jordan Baxter

## 2017-07-22 NOTE — Telephone Encounter (Signed)
Pt called back and appt has been moved to 10/18

## 2017-07-27 ENCOUNTER — Ambulatory Visit: Payer: Medicare Other

## 2017-07-30 ENCOUNTER — Ambulatory Visit (INDEPENDENT_AMBULATORY_CARE_PROVIDER_SITE_OTHER): Payer: Medicare Other

## 2017-07-30 VITALS — BP 110/64 | HR 68 | Temp 98.1°F | Resp 16 | Ht 61.0 in | Wt 175.6 lb

## 2017-07-30 DIAGNOSIS — Z23 Encounter for immunization: Secondary | ICD-10-CM | POA: Diagnosis not present

## 2017-07-30 DIAGNOSIS — Z0000 Encounter for general adult medical examination without abnormal findings: Secondary | ICD-10-CM

## 2017-07-30 NOTE — Patient Instructions (Addendum)
Jordan Baxter , Thank you for taking time to come for your Medicare Wellness Visit. I appreciate your ongoing commitment to your health goals. Please review the following plan we discussed and let me know if I can assist you in the future.   Screening recommendations/referrals: Colonoscopy: Completed 10/14/10. Repeat in 10 years Mammogram: Completed 03/18/16. Please call [phone] to schedule your mammogram. Bone Density: Completed 07/01/16 Recommended yearly ophthalmology/optometry visit for glaucoma screening and checkup Recommended yearly dental visit for hygiene and checkup  Vaccinations: Influenza vaccine: Given today Pneumococcal vaccine: Given Pneumovax 23 today Tdap vaccine: Up to Date Shingles vaccine: Declined. Please provide a copy of your immunization record.    Advanced directives: Please bring a copy of your POA (Power of Attorney) and/or Living Will to your next appointment.   Conditions/risks identified: Recommend to eliminate sweets from your diet; Fall risk prevention discussed  Next appointment: Scheduled to see Dr. [name] on 08/31/17 @ 10:30am. Please schedule your annual wellness exam in one year.   Preventive Care 13 Years and Older, Female Preventive care refers to lifestyle choices and visits with your health care provider that can promote health and wellness. What does preventive care include?  A yearly physical exam. This is also called an annual well check.  Dental exams once or twice a year.  Routine eye exams. Ask your health care provider how often you should have your eyes checked.  Personal lifestyle choices, including:  Daily care of your teeth and gums.  Regular physical activity.  Eating a healthy diet.  Avoiding tobacco and drug use.  Limiting alcohol use.  Practicing safe sex.  Taking low-dose aspirin every day.  Taking vitamin and mineral supplements as recommended by your health care provider. What happens during an annual well  check? The services and screenings done by your health care provider during your annual well check will depend on your age, overall health, lifestyle risk factors, and family history of disease. Counseling  Your health care provider may ask you questions about your:  Alcohol use.  Tobacco use.  Drug use.  Emotional well-being.  Home and relationship well-being.  Sexual activity.  Eating habits.  History of falls.  Memory and ability to understand (cognition).  Work and work Astronomer.  Reproductive health. Screening  You may have the following tests or measurements:  Height, weight, and BMI.  Blood pressure.  Lipid and cholesterol levels. These may be checked every 5 years, or more frequently if you are over 15 years old.  Skin check.  Lung cancer screening. You may have this screening every year starting at age 78 if you have a 30-pack-year history of smoking and currently smoke or have quit within the past 15 years.  Fecal occult blood test (FOBT) of the stool. You may have this test every year starting at age 66.  Flexible sigmoidoscopy or colonoscopy. You may have a sigmoidoscopy every 5 years or a colonoscopy every 10 years starting at age 76.  Hepatitis C blood test.  Hepatitis B blood test.  Sexually transmitted disease (STD) testing.  Diabetes screening. This is done by checking your blood sugar (glucose) after you have not eaten for a while (fasting). You may have this done every 1-3 years.  Bone density scan. This is done to screen for osteoporosis. You may have this done starting at age 72.  Mammogram. This may be done every 1-2 years. Talk to your health care provider about how often you should have regular mammograms. Talk with your  health care provider about your test results, treatment options, and if necessary, the need for more tests. Vaccines  Your health care provider may recommend certain vaccines, such as:  Influenza vaccine. This is  recommended every year.  Tetanus, diphtheria, and acellular pertussis (Tdap, Td) vaccine. You may need a Td booster every 10 years.  Zoster vaccine. You may need this after age 73.  Pneumococcal 13-valent conjugate (PCV13) vaccine. One dose is recommended after age 85.  Pneumococcal polysaccharide (PPSV23) vaccine. One dose is recommended after age 64. Talk to your health care provider about which screenings and vaccines you need and how often you need them. This information is not intended to replace advice given to you by your health care provider. Make sure you discuss any questions you have with your health care provider. Document Released: 10/26/2015 Document Revised: 06/18/2016 Document Reviewed: 07/31/2015 Elsevier Interactive Patient Education  2017 [place] Prevention in the Home Falls can cause injuries. They can happen to people of all ages. There are many things you can do to make your home safe and to help prevent falls. What can I do on the outside of my home?  Regularly fix the edges of walkways and driveways and fix any cracks.  Remove anything that might make you trip as you walk through a door, such as a raised step or threshold.  Trim any bushes or trees on the path to your home.  Use bright outdoor lighting.  Clear any walking paths of anything that might make someone trip, such as rocks or tools.  Regularly check to see if handrails are loose or broken. Make sure that both sides of any steps have handrails.  Any raised decks and porches should have guardrails on the edges.  Have any leaves, snow, or ice cleared regularly.  Use sand or salt on walking paths during winter.  Clean up any spills in your garage right away. This includes oil or grease spills. What can I do in the bathroom?  Use night lights.  Install grab bars by the toilet and in the tub and shower. Do not use towel bars as grab bars.  Use non-skid mats or decals in the tub or  shower.  If you need to sit down in the shower, use a plastic, non-slip stool.  Keep the floor dry. Clean up any water that spills on the floor as soon as it happens.  Remove soap buildup in the tub or shower regularly.  Attach bath mats securely with double-sided non-slip rug tape.  Do not have throw rugs and other things on the floor that can make you trip. What can I do in the bedroom?  Use night lights.  Make sure that you have a light by your bed that is easy to reach.  Do not use any sheets or blankets that are too big for your bed. They should not hang down onto the floor.  Have a firm chair that has side arms. You can use this for support while you get dressed.  Do not have throw rugs and other things on the floor that can make you trip. What can I do in the kitchen?  Clean up any spills right away.  Avoid walking on wet floors.  Keep items that you use a lot in easy-to-reach places.  If you need to reach something above you, use a strong step stool that has a grab bar.  Keep electrical cords out of the way.  Do not use  floor polish or wax that makes floors slippery. If you must use wax, use non-skid floor wax.  Do not have throw rugs and other things on the floor that can make you trip. What can I do with my stairs?  Do not leave any items on the stairs.  Make sure that there are handrails on both sides of the stairs and use them. Fix handrails that are broken or loose. Make sure that handrails are as long as the stairways.  Check any carpeting to make sure that it is firmly attached to the stairs. Fix any carpet that is loose or worn.  Avoid having throw rugs at the top or bottom of the stairs. If you do have throw rugs, attach them to the floor with carpet tape.  Make sure that you have a light switch at the top of the stairs and the bottom of the stairs. If you do not have them, ask someone to add them for you. What else can I do to help prevent  falls?  Wear shoes that:  Do not have high heels.  Have rubber bottoms.  Are comfortable and fit you well.  Are closed at the toe. Do not wear sandals.  If you use a stepladder:  Make sure that it is fully opened. Do not climb a closed stepladder.  Make sure that both sides of the stepladder are locked into place.  Ask someone to hold it for you, if possible.  Clearly mark and make sure that you can see:  Any grab bars or handrails.  First and last steps.  Where the edge of each step is.  Use tools that help you move around (mobility aids) if they are needed. These include:  Canes.  Walkers.  Scooters.  Crutches.  Turn on the lights when you go into a dark area. Replace any light bulbs as soon as they burn out.  Set up your furniture so you have a clear path. Avoid moving your furniture around.  If any of your floors are uneven, fix them.  If there are any pets around you, be aware of where they are.  Review your medicines with your doctor. Some medicines can make you feel dizzy. This can increase your chance of falling. Ask your doctor what other things that you can do to help prevent falls. This information is not intended to replace advice given to you by your health care provider. Make sure you discuss any questions you have with your health care provider. Document Released: 07/26/2009 Document Revised: 03/06/2016 Document Reviewed: 11/03/2014 Elsevier Interactive Patient Education  2017 [employer].

## 2017-07-30 NOTE — Progress Notes (Signed)
Subjective:   Jordan Baxter is a 66 y.o. female who presents for Medicare Annual (Subsequent) preventive examination.  Review of Systems:  N/A Cardiac Risk Factors include: advanced age (>9955men, 81>65 women);hypertension;dyslipidemia;sedentary lifestyle;obesity (BMI >30kg/m2)     Objective:     Vitals: BP 110/64 (BP Location: Right Arm, Patient Position: Sitting, Cuff Size: Normal)   Pulse 68   Temp 98.1 F (36.7 C) (Oral)   Resp 16   Ht 5\' 1"  (1.549 m)   Wt 175 lb 9.6 oz (79.7 kg)   BMI 33.18 kg/m   Body mass index is 33.18 kg/m.   Tobacco History  Smoking Status  . Former Smoker  . Quit date: 03/13/2012  Smokeless Tobacco  . Never Used     Counseling given: Not Answered   Past Medical History:  Diagnosis Date  . Allergy   . GERD (gastroesophageal reflux disease)   . Hyperlipidemia   . Hypertension    Past Surgical History:  Procedure Laterality Date  . CARPAL TUNNEL RELEASE Right   . CERVICAL FUSION  2015  . KNEE ARTHROSCOPY Right   . PARTIAL HYSTERECTOMY    . ROTATOR CUFF REPAIR Left    Family History  Problem Relation Age of Onset  . CAD Mother   . Cancer Father   . Heart disease Sister   . Heart disease Brother    History  Sexual Activity  . Sexual activity: Not on file    Outpatient Encounter Prescriptions as of 07/30/2017  Medication Sig  . acetaminophen-codeine (TYLENOL #3) 300-30 MG tablet Take by mouth every 4 (four) hours as needed for moderate pain.  Marland Kitchen. acetaminophen-codeine (TYLENOL #4) 300-60 MG per tablet Take 1-2 tablets by mouth 3 (three) times daily as needed.  Marland Kitchen. aspirin 81 MG tablet Take 1 tablet by mouth daily.  Marland Kitchen. atorvastatin (LIPITOR) 80 MG tablet Take 1 tablet by mouth daily.  . Calcium-Vitamin D 600-200 MG-UNIT per tablet Take 1 tablet by mouth 2 (two) times daily.  . metoprolol tartrate (LOPRESSOR) 25 MG tablet Take 0.5 tablets by mouth 2 (two) times daily.  Marland Kitchen. omeprazole (PRILOSEC) 20 MG capsule Take 1 capsule (20 mg total)  by mouth daily.  Marland Kitchen. tiZANidine (ZANAFLEX) 4 MG tablet Take 4 mg by mouth 3 (three) times daily.   Marland Kitchen. triamterene-hydrochlorothiazide (MAXZIDE-25) 37.5-25 MG tablet Take 1 tablet by mouth daily.  . fluticasone (FLONASE) 50 MCG/ACT nasal spray Place 2 sprays into both nostrils daily. (Patient not taking: Reported on 07/30/2017)  . Fluticasone-Salmeterol (ADVAIR) 100-50 MCG/DOSE AEPB Inhale 1 puff into the lungs 2 (two) times daily. (Patient not taking: Reported on 07/30/2017)  . lidocaine (LIDODERM) 5 % Place 1 patch onto the skin daily.  . nitroGLYCERIN (NITROSTAT) 0.4 MG SL tablet Place 1 tablet under the tongue daily as needed.   No facility-administered encounter medications on file as of 07/30/2017.     Activities of Daily Living In your present state of health, do you have any difficulty performing the following activities: 07/30/2017 08/22/2016  Hearing? N N  Vision? N N  Difficulty concentrating or making decisions? Y N  Comment occasional memory loss -  Walking or climbing stairs? Y Y  Comment short of breath -  Dressing or bathing? N N  Doing errands, shopping? N N  Preparing Food and eating ? N N  Using the Toilet? N N  In the past six months, have you accidently leaked urine? N N  Do you have problems with loss of bowel control?  N N  Managing your Medications? N N  Managing your Finances? N N  Housekeeping or managing your Housekeeping? N N  Some recent data might be hidden    Patient Care Team: Reubin Milan, MD as PCP - General (Family Medicine) Loren Racer, MD as Referring Physician (Physical Medicine and Rehabilitation) Laretta Bolster (Inactive) as Referring Physician (Cardiology)    Assessment:     Exercise Activities and Dietary recommendations Current Exercise Habits: The patient does not participate in regular exercise at present, Exercise limited by: None identified  Goals    . Reduce sugar intake to X grams per day          Recommend to  eliminate sweets from your diet      Fall Risk Fall Risk  07/30/2017 08/22/2016 08/22/2015  Falls in the past year? Yes No No  Number falls in past yr: 1 - -  Injury with Fall? No - -  Risk for fall due to : Medication side effect - -  Follow up Education provided;Falls prevention discussed - -   Depression Screen PHQ 2/9 Scores 07/30/2017 08/22/2016 08/22/2015  PHQ - 2 Score 0 0 0     Cognitive Function     6CIT Screen 07/30/2017 08/22/2016  What Year? 0 points 0 points  What month? 0 points 0 points  What time? 0 points 0 points  Count back from 20 0 points 0 points  Months in reverse 0 points 0 points  Repeat phrase 0 points 0 points  Total Score 0 0    Immunization History  Administered Date(s) Administered  . Influenza,inj,Quad PF,6+ Mos 07/30/2017  . Influenza-Unspecified 07/22/2015  . Pneumococcal Conjugate-13 08/22/2016  . Pneumococcal Polysaccharide-23 11/13/2009, 07/30/2017  . Tdap 05/03/2014  . Zoster 10/15/2011   Screening Tests Health Maintenance  Topic Date Due  . MAMMOGRAM  03/18/2017  . PNA vac Low Risk Adult (2 of 2 - PPSV23) 08/22/2017  . COLONOSCOPY  10/14/2020  . TETANUS/TDAP  05/03/2024  . INFLUENZA VACCINE  Addressed  . DEXA SCAN  Completed  . Hepatitis C Screening  Completed      Plan:    I have personally reviewed and addressed the Medicare Annual Wellness questionnaire and have noted the following in the patient's chart:  A. Medical and social history B. Use of alcohol, tobacco or illicit drugs  C. Current medications and supplements D. Functional ability and status E.  Nutritional status F.  Physical activity G. Advance directives H. List of other physicians I.  Hospitalizations, surgeries, and ER visits in previous 12 months J.  Vitals K. Screenings such as hearing and vision if needed, cognitive and depression L. Referrals and appointments - none  In addition, I have reviewed and discussed with patient certain preventive  protocols, quality metrics, and best practice recommendations. A written personalized care plan for preventive services as well as general preventive health recommendations were provided to patient.  See attached scanned questionnaire for additional information.   Signed,  Deon Pilling, LPN Nurse Health Advisor   MD Recommendations: Overdue for mammogram. Orders were placed 08/22/16. Pt reminded to schedule appt to complete exam prior to next OV. Completed Pneumococcal series today. HD Flu vaccine on back order. Pt requested Fluarix be given today.

## 2017-08-13 ENCOUNTER — Other Ambulatory Visit: Payer: Self-pay | Admitting: Internal Medicine

## 2017-08-13 DIAGNOSIS — K219 Gastro-esophageal reflux disease without esophagitis: Secondary | ICD-10-CM

## 2017-08-31 ENCOUNTER — Encounter: Payer: Self-pay | Admitting: Internal Medicine

## 2017-08-31 ENCOUNTER — Ambulatory Visit
Admission: RE | Admit: 2017-08-31 | Discharge: 2017-08-31 | Disposition: A | Discharge status: Home / Self Care | Payer: Medicare Other | Source: Ambulatory Visit | Attending: Internal Medicine | Admitting: Internal Medicine

## 2017-08-31 ENCOUNTER — Ambulatory Visit (INDEPENDENT_AMBULATORY_CARE_PROVIDER_SITE_OTHER): Payer: Medicare Other | Admitting: Internal Medicine

## 2017-08-31 VITALS — BP 144/86 | HR 63 | Ht 61.0 in | Wt 174.0 lb

## 2017-08-31 DIAGNOSIS — Z23 Encounter for immunization: Secondary | ICD-10-CM | POA: Diagnosis not present

## 2017-08-31 DIAGNOSIS — E782 Mixed hyperlipidemia: Secondary | ICD-10-CM | POA: Diagnosis not present

## 2017-08-31 DIAGNOSIS — R0602 Shortness of breath: Secondary | ICD-10-CM | POA: Diagnosis present

## 2017-08-31 DIAGNOSIS — J9811 Atelectasis: Secondary | ICD-10-CM | POA: Diagnosis not present

## 2017-08-31 DIAGNOSIS — I10 Essential (primary) hypertension: Secondary | ICD-10-CM

## 2017-08-31 DIAGNOSIS — F17201 Nicotine dependence, unspecified, in remission: Secondary | ICD-10-CM

## 2017-08-31 DIAGNOSIS — Z0000 Encounter for general adult medical examination without abnormal findings: Secondary | ICD-10-CM

## 2017-08-31 DIAGNOSIS — I25119 Atherosclerotic heart disease of native coronary artery with unspecified angina pectoris: Secondary | ICD-10-CM

## 2017-08-31 DIAGNOSIS — K219 Gastro-esophageal reflux disease without esophagitis: Secondary | ICD-10-CM

## 2017-08-31 DIAGNOSIS — Z1231 Encounter for screening mammogram for malignant neoplasm of breast: Secondary | ICD-10-CM

## 2017-08-31 DIAGNOSIS — Z87891 Personal history of nicotine dependence: Secondary | ICD-10-CM | POA: Insufficient documentation

## 2017-08-31 LAB — POCT URINALYSIS DIPSTICK
Bilirubin, UA: NEGATIVE
Blood, UA: NEGATIVE
GLUCOSE UA: NEGATIVE
KETONES UA: NEGATIVE
LEUKOCYTES UA: NEGATIVE
Nitrite, UA: NEGATIVE
PROTEIN UA: NEGATIVE
Spec Grav, UA: 1.01 (ref 1.010–1.025)
UROBILINOGEN UA: 0.2 U/dL
pH, UA: 7 (ref 5.0–8.0)

## 2017-08-31 MED ORDER — ZOSTER VAC RECOMB ADJUVANTED 50 MCG/0.5ML IM SUSR: 0.5000 mL | Freq: Once | INTRAMUSCULAR | 1 refills | Status: AC

## 2017-08-31 MED ORDER — OMEPRAZOLE 20 MG PO CPDR: 20.0000 mg | DELAYED_RELEASE_CAPSULE | Freq: Every day | ORAL | 3 refills | Status: DC

## 2017-08-31 NOTE — Progress Notes (Signed)
Date:  08/31/2017   Name:  Jordan Baxter   DOB:  Jul 09, 1951   MRN:  [id number]   Chief Complaint: Annual Exam (Breast Exam. ) Jordan Baxter is a 66 y.o. female who presents today for her Complete Annual Exam. She feels fairly well. She reports exercising doing therapy but has not stopped. She reports she is sleeping fairly well.  She denies breast problems.  She is over due for a mammogram.  Gastroesophageal Reflux  She complains of coughing. She reports no abdominal pain, no chest pain, no heartburn, no hoarse voice or no wheezing. This is a chronic problem. The problem occurs rarely. Pertinent negatives include no fatigue. She has tried a PPI for the symptoms.  Hyperlipidemia  This is a chronic problem. The problem is controlled. Associated symptoms include shortness of breath. Pertinent negatives include no chest pain. Current antihyperlipidemic treatment includes statins. The current treatment provides significant improvement of lipids.  Hypertension  This is a chronic problem. The problem is unchanged. The problem is controlled. Associated symptoms include shortness of breath. Pertinent negatives include no chest pain, headaches or palpitations. Past treatments include beta blockers and diuretics. The current treatment provides significant improvement. Hypertensive end-organ damage includes CAD/MI. followed by Cardiology.   Shortness of breath/wheezing - seems to be having more sx.  Had Advair in the past but did not get a refill. She quit smoking 5 years ago.  Most of her sx are when the weather changes and sometimes at night.  Review of chart shows no CXR or other chest imaging.  CAD - stable without symptoms other than SOB as above.  She remains on all cardiac meds , prescribed by cardiology. Has not had labs done in a year.   Review of Systems  Constitutional: Negative for chills, fatigue and fever.  HENT: Negative for congestion, hearing loss, hoarse voice, tinnitus, trouble  swallowing and voice change.   Eyes: Negative for visual disturbance.  Respiratory: Positive for cough and shortness of breath. Negative for chest tightness and wheezing.   Cardiovascular: Negative for chest pain, palpitations and leg swelling.  Gastrointestinal: Negative for abdominal pain, constipation, diarrhea, heartburn and vomiting.  Endocrine: Negative for polydipsia and polyuria.  Genitourinary: Negative for dysuria, frequency, genital sores, vaginal bleeding and vaginal discharge.  Musculoskeletal: Positive for arthralgias and back pain. Negative for gait problem and joint swelling.  Skin: Negative for color change and rash.  Neurological: Negative for dizziness, tremors, light-headedness and headaches.  Hematological: Negative for adenopathy. Does not bruise/bleed easily.  Psychiatric/Behavioral: Negative for dysphoric mood and sleep disturbance. The patient is not nervous/anxious.     Patient Active Problem List   Diagnosis Date Noted  . BMI 32.0-32.9,adult 05/21/2016  . Tobacco use disorder, moderate, in sustained remission 04/23/2016  . H/O cardiac catheterization 03/20/2015  . Coronary artery disease involving native heart with angina pectoris (HCC) 03/19/2015  . Essential (primary) hypertension 03/19/2015  . Fibrositis 03/19/2015  . Gastro-esophageal reflux disease without esophagitis 03/19/2015  . Hyperlipidemia, mixed 03/19/2015  . H/O arthrodesis 03/19/2015    Prior to Admission medications   Medication Sig Start Date End Date Taking? Authorizing Provider  acetaminophen-codeine (TYLENOL #3) 300-30 MG tablet Take by mouth every 4 (four) hours as needed for moderate pain.   Yes [provider]  acetaminophen-codeine (TYLENOL #4) 300-60 MG per tablet Take 1-2 tablets by mouth 3 (three) times daily as needed. 05/14/15  Yes [provider]  aspirin 81 MG tablet Take 1 tablet by mouth daily.  Yes [provider]  atorvastatin (LIPITOR) 80 MG  tablet Take 1 tablet by mouth daily.   Yes [provider]  Calcium-Vitamin D 600-200 MG-UNIT per tablet Take 1 tablet by mouth 2 (two) times daily.   Yes [provider]  fluticasone (FLONASE) 50 MCG/ACT nasal spray Place 2 sprays into both nostrils daily. 02/19/17  Yes Reubin MilanBerglund, Laura H, MD  lidocaine (LIDODERM) 5 % Place 1 patch onto the skin daily. 01/22/17  Yes [provider]  metoprolol tartrate (LOPRESSOR) 25 MG tablet Take 0.5 tablets by mouth 2 (two) times daily.   Yes [provider]  nitroGLYCERIN (NITROSTAT) 0.4 MG SL tablet Place 1 tablet under the tongue daily as needed.   Yes [provider]  omeprazole (PRILOSEC) 20 MG capsule TAKE 1 CAPSULE(20 MG) BY MOUTH DAILY 08/14/17  Yes Reubin MilanBerglund, Laura H, MD  tiZANidine (ZANAFLEX) 4 MG tablet Take 4 mg by mouth 3 (three) times daily.  07/31/15  Yes [provider]  triamterene-hydrochlorothiazide (MAXZIDE-25) 37.5-25 MG tablet Take 1 tablet by mouth daily. 03/03/17  Yes Reubin MilanBerglund, Laura H, MD  Fluticasone-Salmeterol (ADVAIR) 100-50 MCG/DOSE AEPB Inhale 1 puff into the lungs 2 (two) times daily. Patient not taking: Reported on 08/31/2017 03/03/17   Reubin MilanBerglund, Laura H, MD    Allergies  Allergen Reactions  . Cephalosporins   . Ibuprofen Nausea Only  . Penicillins   . Sulfa Antibiotics Photosensitivity    Past Surgical History:  Procedure Laterality Date  . CARPAL TUNNEL RELEASE Right   . CERVICAL FUSION  2015  . KNEE ARTHROSCOPY Right   . PARTIAL HYSTERECTOMY    . ROTATOR CUFF REPAIR Left     Social History   Tobacco Use  . Smoking status: Former Smoker    Last attempt to quit: 03/13/2012    Years since quitting: 5.4  . Smokeless tobacco: Never Used  Substance Use Topics  . Alcohol use: No    Alcohol/week: 0.0 oz  . Drug use: No     Medication list has been reviewed and updated.  PHQ 2/9 Scores 07/30/2017 08/22/2016 08/22/2015  PHQ - 2 Score 0 0 0    Physical Exam    Constitutional: She is oriented to person, place, and time. She appears well-developed and well-nourished. No distress.  HENT:  Head: Normocephalic and atraumatic.  Right Ear: Tympanic membrane and ear canal normal.  Left Ear: Tympanic membrane and ear canal normal.  Nose: Right sinus exhibits no maxillary sinus tenderness. Left sinus exhibits no maxillary sinus tenderness.  Mouth/Throat: Uvula is midline and oropharynx is clear and moist.  Eyes: Conjunctivae and EOM are normal. Right eye exhibits no discharge. Left eye exhibits no discharge. No scleral icterus.  Neck: Normal range of motion. Carotid bruit is not present. No erythema present. No thyromegaly present.  Cardiovascular: Normal rate, regular rhythm, normal heart sounds and normal pulses.  Pulmonary/Chest: Effort normal. No respiratory distress. She has no wheezes. Right breast exhibits no mass, no nipple discharge, no skin change and no tenderness. Left breast exhibits inverted nipple (occurs with breast manipulation). Left breast exhibits no mass, no nipple discharge, no skin change and no tenderness.  Abdominal: Soft. Bowel sounds are normal. There is no hepatosplenomegaly. There is no tenderness. There is no CVA tenderness.  Musculoskeletal: She exhibits no edema.       Lumbar back: She exhibits decreased range of motion and bony tenderness.  Lymphadenopathy:    She has no cervical adenopathy.    She has no axillary  adenopathy.  Neurological: She is alert and oriented to person, place, and time. She has normal reflexes. No cranial nerve deficit or sensory deficit.  Skin: Skin is warm, dry and intact. No rash noted.  Psychiatric: She has a normal mood and affect. Her speech is normal and behavior is normal. Thought content normal.  Nursing note and vitals reviewed.   BP (!) 144/86   Pulse 63   Ht 5\' 1"  (1.549 m)   Wt 174 lb (78.9 kg)   SpO2 96%   BMI 32.88 kg/m   Assessment and Plan: 1. Annual physical  exam Completed MAW done  2. Encounter for screening mammogram for breast cancer Pt to schedule at [facility]  3. Coronary artery disease involving native coronary artery of native heart with angina pectoris (HCC) stable  4. Essential (primary) hypertension controlled - CBC with Differential/Platelet - Comprehensive metabolic panel - TSH - POCT urinalysis dipstick  5. Gastro-esophageal reflux disease without esophagitis Doing well on PPI - omeprazole (PRILOSEC) 20 MG capsule; Take 1 capsule (20 mg total) daily by mouth.  Dispense: 90 capsule; Refill: 3  6. Hyperlipidemia, mixed Check labs on statin therapy - Comprehensive metabolic panel - Lipid panel  7. Tobacco use disorder, moderate, in sustained remission Remains tobacco free  8. Need for shingles vaccine - Zoster Vaccine Adjuvanted [facility]) injection; Inject 0.5 mLs once for 1 dose into the muscle.  Dispense: 0.5 mL; Refill: 1  9. Shortness of breath Concern for mild COPD - Resume Advair  - DG Chest 2 View; Future   Meds ordered this encounter  Medications  . Zoster Vaccine Adjuvanted [facility]) injection    Sig: Inject 0.5 mLs once for 1 dose into the muscle.    Dispense:  0.5 mL    Refill:  1  . omeprazole (PRILOSEC) 20 MG capsule    Sig: Take 1 capsule (20 mg total) daily by mouth.    Dispense:  90 capsule    Refill:  3    Partially dictated using Animal nutritionist. Any errors are unintentional.  Bari Edward, MD [facility]  08/31/2017

## 2017-09-01 ENCOUNTER — Other Ambulatory Visit: Payer: Self-pay

## 2017-09-01 DIAGNOSIS — R0602 Shortness of breath: Secondary | ICD-10-CM

## 2017-09-01 LAB — COMPREHENSIVE METABOLIC PANEL
A/G RATIO: 1.8 (ref 1.2–2.2)
ALT: 26 IU/L (ref 0–32)
AST: 25 IU/L (ref 0–40)
Albumin: 4.6 g/dL (ref 3.6–4.8)
Alkaline Phosphatase: 81 IU/L (ref 39–117)
BILIRUBIN TOTAL: 0.3 mg/dL (ref 0.0–1.2)
BUN/Creatinine Ratio: 21 (ref 12–28)
BUN: 16 mg/dL (ref 8–27)
CALCIUM: 9.6 mg/dL (ref 8.7–10.3)
CHLORIDE: 99 mmol/L (ref 96–106)
CO2: 29 mmol/L (ref 20–29)
Creatinine, Ser: 0.75 mg/dL (ref 0.57–1.00)
GFR, EST AFRICAN AMERICAN: 96 mL/min/{1.73_m2} (ref >59)
GFR, EST NON AFRICAN AMERICAN: 83 mL/min/{1.73_m2} (ref >59)
GLOBULIN, TOTAL: 2.6 g/dL (ref 1.5–4.5)
Glucose: 74 mg/dL (ref 65–99)
POTASSIUM: 5 mmol/L (ref 3.5–5.2)
SODIUM: 144 mmol/L (ref 134–144)
TOTAL PROTEIN: 7.2 g/dL (ref 6.0–8.5)

## 2017-09-01 LAB — CBC WITH DIFFERENTIAL/PLATELET
BASOS: 1 %
Basophils Absolute: 0.1 10*3/uL (ref 0.0–0.2)
EOS (ABSOLUTE): 0.3 10*3/uL (ref 0.0–0.4)
EOS: 5 %
HEMATOCRIT: 39.7 % (ref 34.0–46.6)
Hemoglobin: 13.8 g/dL (ref 11.1–15.9)
IMMATURE GRANS (ABS): 0 10*3/uL (ref 0.0–0.1)
IMMATURE GRANULOCYTES: 0 %
Lymphocytes Absolute: 2.5 10*3/uL (ref 0.7–3.1)
Lymphs: 49 %
MCH: 30.1 pg (ref 26.6–33.0)
MCHC: 34.8 g/dL (ref 31.5–35.7)
MCV: 87 fL (ref 79–97)
MONOS ABS: 0.6 10*3/uL (ref 0.1–0.9)
Monocytes: 11 %
NEUTROS ABS: 1.7 10*3/uL (ref 1.4–7.0)
NEUTROS PCT: 34 %
PLATELETS: 336 10*3/uL (ref 150–379)
RBC: 4.58 x10E6/uL (ref 3.77–5.28)
RDW: 14.8 % (ref 12.3–15.4)
WBC: 5.1 10*3/uL (ref 3.4–10.8)

## 2017-09-01 LAB — TSH: TSH: 1.54 u[IU]/mL (ref 0.450–4.500)

## 2017-09-01 LAB — LIPID PANEL
CHOL/HDL RATIO: 3 ratio (ref 0.0–4.4)
Cholesterol, Total: 181 mg/dL (ref 100–199)
HDL: 60 mg/dL (ref >39)
LDL Calculated: 91 mg/dL (ref 0–99)
Triglycerides: 148 mg/dL (ref 0–149)
VLDL Cholesterol Cal: 30 mg/dL (ref 5–40)

## 2017-09-01 MED ORDER — FLUTICASONE-SALMETEROL 100-50 MCG/DOSE IN AEPB
1.0000 | INHALATION_SPRAY | Freq: Two times a day (BID) | RESPIRATORY_TRACT | 12 refills | Status: DC
Start: 2017-09-01 — End: 2017-09-02

## 2017-09-02 ENCOUNTER — Other Ambulatory Visit: Payer: Self-pay

## 2017-09-02 DIAGNOSIS — R0602 Shortness of breath: Secondary | ICD-10-CM

## 2017-09-02 MED ORDER — FLUTICASONE-SALMETEROL 100-50 MCG/DOSE IN AEPB: 1.0000 | INHALATION_SPRAY | Freq: Two times a day (BID) | RESPIRATORY_TRACT | 12 refills | Status: DC

## 2017-10-30 ENCOUNTER — Encounter: Payer: Self-pay | Admitting: Internal Medicine

## 2017-10-30 ENCOUNTER — Ambulatory Visit: Payer: Medicare Other | Admitting: Internal Medicine

## 2017-10-30 VITALS — BP 140/82 | HR 81 | Temp 98.4°F | Ht 61.0 in | Wt 174.0 lb

## 2017-10-30 DIAGNOSIS — J40 Bronchitis, not specified as acute or chronic: Secondary | ICD-10-CM

## 2017-10-30 MED ORDER — GUAIFENESIN-CODEINE 100-10 MG/5ML PO SYRP: 5.0000 mL | ORAL_SOLUTION | Freq: Three times a day (TID) | ORAL | 0 refills | Status: DC | PRN

## 2017-10-30 MED ORDER — LEVOFLOXACIN 500 MG PO TABS: 500.0000 mg | ORAL_TABLET | Freq: Every day | ORAL | 0 refills | Status: DC

## 2017-10-30 NOTE — Progress Notes (Signed)
Date:  10/30/2017   Name:  Jordan Baxter   DOB:  07-Sep-1951   MRN:  [id number]   Chief Complaint: Cough (Started Monday- using salt water to gargle. Yellow/green production. Sinus congestion. No fever. )  Cough  This is a new problem. The current episode started in the past 7 days. The problem has been unchanged. The problem occurs hourly. The cough is productive of sputum. Associated symptoms include postnasal drip and shortness of breath. Pertinent negatives include no chest pain, chills, fever or wheezing.     Review of Systems  Constitutional: Negative for chills and fever.  HENT: Positive for postnasal drip.   Respiratory: Positive for cough and shortness of breath. Negative for wheezing.   Cardiovascular: Negative for chest pain and palpitations.    Patient Active Problem List   Diagnosis Date Noted  . BMI 32.0-32.9,adult 05/21/2016  . Tobacco use disorder, moderate, in sustained remission 04/23/2016  . H/O cardiac catheterization 03/20/2015  . Coronary artery disease involving native heart with angina pectoris (HCC) 03/19/2015  . Essential (primary) hypertension 03/19/2015  . Fibrositis 03/19/2015  . Gastro-esophageal reflux disease without esophagitis 03/19/2015  . Hyperlipidemia, mixed 03/19/2015  . H/O arthrodesis 03/19/2015    Prior to Admission medications   Medication Sig Start Date End Date Taking? Authorizing Provider  acetaminophen-codeine (TYLENOL #3) 300-30 MG tablet Take by mouth every 4 (four) hours as needed for moderate pain.   Yes [provider]  acetaminophen-codeine (TYLENOL #4) 300-60 MG per tablet Take 1-2 tablets by mouth 3 (three) times daily as needed. 05/14/15  Yes [provider]  aspirin 81 MG tablet Take 1 tablet by mouth daily.   Yes [provider]  atorvastatin (LIPITOR) 80 MG tablet Take 1 tablet by mouth daily.   Yes [provider]  Calcium-Vitamin D 600-200 MG-UNIT per tablet Take 1 tablet by mouth 2  (two) times daily.   Yes [provider]  fluticasone (FLONASE) 50 MCG/ACT nasal spray Place 2 sprays into both nostrils daily. 02/19/17  Yes Reubin Milan, MD  Fluticasone-Salmeterol (ADVAIR) 100-50 MCG/DOSE AEPB Inhale 1 puff into the lungs 2 (two) times daily. 09/02/17  Yes Reubin Milan, MD  lidocaine (LIDODERM) 5 % Place 1 patch onto the skin daily. 01/22/17  Yes [provider]  metoprolol tartrate (LOPRESSOR) 25 MG tablet Take 0.5 tablets by mouth 2 (two) times daily.   Yes [provider]  nitroGLYCERIN (NITROSTAT) 0.4 MG SL tablet Place 1 tablet under the tongue daily as needed.   Yes [provider]  omeprazole (PRILOSEC) 20 MG capsule Take 1 capsule (20 mg total) daily by mouth. 08/31/17  Yes Reubin Milan, MD  tiZANidine (ZANAFLEX) 4 MG tablet Take 4 mg by mouth 3 (three) times daily.  07/31/15  Yes [provider]  triamterene-hydrochlorothiazide (MAXZIDE-25) 37.5-25 MG tablet Take 1 tablet by mouth daily. 03/03/17  Yes Reubin Milan, MD    Allergies  Allergen Reactions  . Cephalosporins   . Ibuprofen Nausea Only  . Penicillins   . Sulfa Antibiotics Photosensitivity    Past Surgical History:  Procedure Laterality Date  . CARPAL TUNNEL RELEASE Right   . CERVICAL FUSION  2015  . KNEE ARTHROSCOPY Right   . PARTIAL HYSTERECTOMY    . ROTATOR CUFF REPAIR Left     Social History   Tobacco Use  . Smoking status: Former Smoker    Last attempt to quit: 03/13/2012    Years since quitting: 5.6  .  Smokeless tobacco: Never Used  Substance Use Topics  . Alcohol use: No    Alcohol/week: 0.0 oz  . Drug use: No     Medication list has been reviewed and updated.  PHQ 2/9 Scores 07/30/2017 08/22/2016 08/22/2015  PHQ - 2 Score 0 0 0    Physical Exam  Constitutional: She is oriented to person, place, and time. She appears well-developed. No distress.  HENT:  Head: Normocephalic and atraumatic.  Nose: Right sinus  exhibits no maxillary sinus tenderness and no frontal sinus tenderness. Left sinus exhibits no maxillary sinus tenderness and no frontal sinus tenderness.  Mouth/Throat: No posterior oropharyngeal erythema.  Cardiovascular: Normal rate, regular rhythm and normal heart sounds.  Pulmonary/Chest: Effort normal. No respiratory distress. She has no wheezes. She has no rales.  Musculoskeletal: Normal range of motion. She exhibits no edema.  Neurological: She is alert and oriented to person, place, and time.  Skin: Skin is warm and dry. No rash noted.  Psychiatric: She has a normal mood and affect. Her behavior is normal. Thought content normal.  Nursing note and vitals reviewed.   BP 140/82   Pulse 81   Temp 98.4 F (36.9 C) (Oral)   Ht 5\' 1"  (1.549 m)   Wt 174 lb (78.9 kg)   SpO2 92%   BMI 32.88 kg/m   Assessment and Plan: 1. Bronchitis - levofloxacin (LEVAQUIN) 500 MG tablet; Take 1 tablet (500 mg total) by mouth daily.  Dispense: 7 tablet; Refill: 0 - guaiFENesin-codeine (ROBITUSSIN AC) 100-10 MG/5ML syrup; Take 5 mLs by mouth 3 (three) times daily as needed for cough.  Dispense: 150 mL; Refill: 0   Meds ordered this encounter  Medications  . levofloxacin (LEVAQUIN) 500 MG tablet    Sig: Take 1 tablet (500 mg total) by mouth daily.    Dispense:  7 tablet    Refill:  0  . guaiFENesin-codeine (ROBITUSSIN AC) 100-10 MG/5ML syrup    Sig: Take 5 mLs by mouth 3 (three) times daily as needed for cough.    Dispense:  150 mL    Refill:  0    Partially dictated using Animal nutritionist. Any errors are unintentional.  Bari Edward, MD [facility]  10/30/2017

## 2017-11-06 ENCOUNTER — Telehealth: Payer: Self-pay

## 2017-11-06 NOTE — Telephone Encounter (Signed)
Neither of those agents should cause edema.  She can take an extra fluid pill for a couple of days and elevate her legs.

## 2017-11-06 NOTE — Telephone Encounter (Signed)
Ms Artuso informed to take extra fluid pill for next three days, and elevate legs as much as possible. If not better after this then call office and I will inform Dr [name].

## 2017-11-06 NOTE — Telephone Encounter (Signed)
Patient called stating she took last antibiotic last night from when she was last seen. She is feeling much better but for the past 2 nights is having swelling in both ankles to the point the fluid is buckling over her shoes. Also, had a shingles vaccine given this week. Can either the antibiotics for the shingles vaccine cause this swelling?  Please Advise.

## 2017-11-20 ENCOUNTER — Other Ambulatory Visit: Payer: Self-pay | Admitting: Internal Medicine

## 2017-11-20 ENCOUNTER — Telehealth: Payer: Self-pay

## 2017-11-20 DIAGNOSIS — J40 Bronchitis, not specified as acute or chronic: Secondary | ICD-10-CM

## 2017-11-20 MED ORDER — LEVOFLOXACIN 500 MG PO TABS: 500.0000 mg | ORAL_TABLET | Freq: Every day | ORAL | 0 refills | Status: AC

## 2017-11-20 NOTE — Telephone Encounter (Signed)
Patient called stating she got better after finishing antibiotics from 10/30/2017 visit but then got worse again starting Monday. Cough Is back strong and she has a lot fo chest congestion. Wanted to know if we can send in anything else instead of her coming back in?  Please Advise.

## 2017-11-20 NOTE — Telephone Encounter (Signed)
I sent in another Rx Levafloxin

## 2017-11-20 NOTE — Telephone Encounter (Signed)
Patient informed and we will need to see her if no better after antibiotics this time. She verbalized understanding.

## 2017-12-07 ENCOUNTER — Ambulatory Visit: Payer: Medicare Other | Admitting: Internal Medicine

## 2017-12-07 ENCOUNTER — Encounter: Payer: Self-pay | Admitting: Internal Medicine

## 2017-12-07 VITALS — BP 128/78 | HR 63 | Temp 98.2°F | Ht 61.0 in | Wt 174.0 lb

## 2017-12-07 DIAGNOSIS — R059 Cough, unspecified: Secondary | ICD-10-CM

## 2017-12-07 DIAGNOSIS — R05 Cough: Secondary | ICD-10-CM

## 2017-12-07 NOTE — Patient Instructions (Signed)
Anoro - one puff daily

## 2017-12-07 NOTE — Progress Notes (Signed)
Date:  12/07/2017   Name:  Jordan Baxter   DOB:  July 05, 1951   MRN:  [id number]   Chief Complaint: Cough (Has had 2 rounds of antibiotics. Cough is holding on and when begins to cough cannot stop. Had pluerisy and still thinks she has this because she hurts when breathing and coughing. )  Cough  This is a chronic problem. The current episode started more than 1 month ago. The problem has been unchanged. The problem occurs every few minutes. The cough is non-productive. Associated symptoms include myalgias. Pertinent negatives include no chest pain, chills, fever, headaches, shortness of breath or wheezing. There is no history of environmental allergies.   She is not on ACE or ARB,  She takes omeprazole for [place].  She is on Advair daily for the past few months but did forget to take it to [place] and the cough was no different.  She does a lot of yard work and wonders if the leaves and pollen are an issue.  She does not take antihistamines.  The cough is non-productive and feels dry.  There is sometimes a scant sputum first thing in the morning.  Review of Systems  Constitutional: Negative for chills, fatigue and fever.  Eyes: Negative for visual disturbance.  Respiratory: Positive for cough. Negative for chest tightness, shortness of breath and wheezing.   Cardiovascular: Positive for palpitations. Negative for chest pain.  Gastrointestinal: Positive for abdominal pain.  Musculoskeletal: Positive for arthralgias, back pain and myalgias.  Allergic/Immunologic: Negative for environmental allergies and food allergies.  Neurological: Negative for dizziness and headaches.    Patient Active Problem List   Diagnosis Date Noted  . BMI 32.0-32.9,adult 05/21/2016  . Tobacco use disorder, moderate, in sustained remission 04/23/2016  . H/O cardiac catheterization 03/20/2015  . Coronary artery disease involving native heart with angina pectoris (HCC) 03/19/2015  . Essential (primary) hypertension  03/19/2015  . Fibrositis 03/19/2015  . Gastro-esophageal reflux disease without esophagitis 03/19/2015  . Hyperlipidemia, mixed 03/19/2015  . H/O arthrodesis 03/19/2015    Prior to Admission medications   Medication Sig Start Date End Date Taking? Authorizing Provider  acetaminophen-codeine (TYLENOL #3) 300-30 MG tablet Take by mouth every 4 (four) hours as needed for moderate pain.   Yes [provider]  acetaminophen-codeine (TYLENOL #4) 300-60 MG per tablet Take 1-2 tablets by mouth 3 (three) times daily as needed. 05/14/15  Yes [provider]  aspirin 81 MG tablet Take 1 tablet by mouth daily.   Yes [provider]  atorvastatin (LIPITOR) 80 MG tablet Take 1 tablet by mouth daily.   Yes [provider]  Calcium-Vitamin D 600-200 MG-UNIT per tablet Take 1 tablet by mouth 2 (two) times daily.   Yes [provider]  fluticasone (FLONASE) 50 MCG/ACT nasal spray Place 2 sprays into both nostrils daily. 02/19/17  Yes Reubin Milan, MD  Fluticasone-Salmeterol (ADVAIR) 100-50 MCG/DOSE AEPB Inhale 1 puff into the lungs 2 (two) times daily. 09/02/17  Yes Reubin Milan, MD  lidocaine (LIDODERM) 5 % Place 1 patch onto the skin daily. 01/22/17  Yes [provider]  metoprolol tartrate (LOPRESSOR) 25 MG tablet Take 0.5 tablets by mouth 2 (two) times daily.   Yes [provider]  nitroGLYCERIN (NITROSTAT) 0.4 MG SL tablet Place 1 tablet under the tongue daily as needed.   Yes [provider]  omeprazole (PRILOSEC) 20 MG capsule Take 1 capsule (20 mg total) daily by mouth. 08/31/17  Yes Reubin Milan,  MD  tiZANidine (ZANAFLEX) 4 MG tablet Take 4 mg by mouth 3 (three) times daily.  07/31/15  Yes [provider]  triamterene-hydrochlorothiazide (MAXZIDE-25) 37.5-25 MG tablet Take 1 tablet by mouth daily. 03/03/17  Yes Reubin Milan, MD    Allergies  Allergen Reactions  . Cephalosporins   . Ibuprofen Nausea Only    . Penicillins   . Sulfa Antibiotics Photosensitivity    Past Surgical History:  Procedure Laterality Date  . CARPAL TUNNEL RELEASE Right   . CERVICAL FUSION  2015  . KNEE ARTHROSCOPY Right   . PARTIAL HYSTERECTOMY    . ROTATOR CUFF REPAIR Left     Social History   Tobacco Use  . Smoking status: Former Smoker    Last attempt to quit: 03/13/2012    Years since quitting: 5.7  . Smokeless tobacco: Never Used  Substance Use Topics  . Alcohol use: No    Alcohol/week: 0.0 oz  . Drug use: No     Medication list has been reviewed and updated.  PHQ 2/9 Scores 07/30/2017 08/22/2016 08/22/2015  PHQ - 2 Score 0 0 0    Physical Exam  Constitutional: She is oriented to person, place, and time. She appears well-developed. No distress.  HENT:  Head: Normocephalic and atraumatic.  Neck: Normal range of motion. Neck supple.  Cardiovascular: Normal rate, regular rhythm and normal heart sounds.  Pulmonary/Chest: Effort normal and breath sounds normal. No respiratory distress. She has no wheezes. She has no rales.  Musculoskeletal: Normal range of motion.  Neurological: She is alert and oriented to person, place, and time.  Skin: Skin is warm and dry. No rash noted.  Psychiatric: She has a normal mood and affect. Her behavior is normal. Thought content normal.  Nursing note and vitals reviewed.   BP 128/78   Pulse 63   Temp 98.2 F (36.8 C) (Oral)   Ht 5\' 1"  (1.549 m)   Wt 174 lb (78.9 kg)   SpO2 95%   BMI 32.88 kg/m   Assessment and Plan: 1. Cough in adult Will try Anoro once a day in place of Advair If no improvement, try over the counter non sedating antihistamine   No orders of the defined types were placed in this encounter.   Partially dictated using Animal nutritionist. Any errors are unintentional.  Bari Edward, MD [facility]  12/07/2017

## 2017-12-15 ENCOUNTER — Telehealth: Payer: Self-pay

## 2017-12-15 ENCOUNTER — Other Ambulatory Visit: Payer: Self-pay | Admitting: Internal Medicine

## 2017-12-15 MED ORDER — UMECLIDINIUM-VILANTEROL 62.5-25 MCG/INH IN AEPB: 1.0000 | INHALATION_SPRAY | Freq: Every day | RESPIRATORY_TRACT | 12 refills | Status: DC

## 2017-12-15 NOTE — Telephone Encounter (Signed)
Patient called stating she is doing much better on Anora inhaler. Wants to continue that inhaler instead of Advair and would like sent to [facility] in [place]. Also, informed her she would need to get claritin OTC since insurance will not cover it. Told her to get generic brand, does not need name brand. She verbalized understanding.

## 2018-01-19 LAB — LIPID PANEL
CHOLESTEROL: 159 (ref 0–200)
HDL: 49 (ref 35–70)
LDL CALC: 94
Triglycerides: 151 (ref 40–160)

## 2018-01-19 LAB — CBC AND DIFFERENTIAL
HEMATOCRIT: 40 (ref 36–46)
Hemoglobin: 13 (ref 12.0–16.0)
PLATELETS: 307 (ref 150–399)
WBC: 6.7

## 2018-01-20 ENCOUNTER — Telehealth: Payer: Self-pay | Admitting: Internal Medicine

## 2018-01-20 NOTE — Telephone Encounter (Signed)
Called to RE-schedule AWV with Nurse Health Advisor. °Thank you! °For questions please contact: °Kathryn Brown °[phone]  °Skype [email or web address]  ° °

## 2018-02-09 ENCOUNTER — Telehealth: Payer: Self-pay

## 2018-02-09 NOTE — Telephone Encounter (Signed)
LM having indigestion and chest pains with back pain.  Advised ER

## 2018-02-10 MED ORDER — ACETAMINOPHEN-CODEINE #3 300-30 MG PO TABS
1.00 | ORAL_TABLET | ORAL | Status: DC
Start: 2018-02-11 — End: 2018-02-10

## 2018-02-10 MED ORDER — NITROGLYCERIN 0.4 MG SL SUBL
0.40 | SUBLINGUAL_TABLET | SUBLINGUAL | Status: DC
Start: ? — End: 2018-02-10

## 2018-02-10 MED ORDER — LIDOCAINE 5 % EX PTCH
1.00 | MEDICATED_PATCH | CUTANEOUS | Status: DC
Start: 2018-02-10 — End: 2018-02-10

## 2018-02-10 MED ORDER — FLUTICASONE PROPIONATE 50 MCG/ACT NA SUSP
2.00 | NASAL | Status: DC
Start: ? — End: 2018-02-10

## 2018-02-10 MED ORDER — LIDOCAINE HCL 1 % IJ SOLN
0.50 | INTRAMUSCULAR | Status: DC
Start: ? — End: 2018-02-10

## 2018-02-10 MED ORDER — PANTOPRAZOLE SODIUM 20 MG PO TBEC
20.00 | DELAYED_RELEASE_TABLET | ORAL | Status: DC
Start: 2018-02-11 — End: 2018-02-10

## 2018-02-10 MED ORDER — ASPIRIN EC 81 MG PO TBEC
81.00 | DELAYED_RELEASE_TABLET | ORAL | Status: DC
Start: 2018-02-11 — End: 2018-02-10

## 2018-02-10 MED ORDER — TIZANIDINE HCL 4 MG PO TABS
4.00 | ORAL_TABLET | ORAL | Status: DC
Start: 2018-02-10 — End: 2018-02-10

## 2018-02-10 MED ORDER — ATORVASTATIN CALCIUM 40 MG PO TABS
80.00 | ORAL_TABLET | ORAL | Status: DC
Start: 2018-02-11 — End: 2018-02-10

## 2018-02-10 MED ORDER — TRIAMTERENE-HCTZ 37.5-25 MG PO TABS
1.00 | ORAL_TABLET | ORAL | Status: DC
Start: 2018-02-11 — End: 2018-02-10

## 2018-02-10 MED ORDER — CALCIUM CARBONATE-VITAMIN D 600-400 MG-UNIT PO TABS
1.00 | ORAL_TABLET | ORAL | Status: DC
Start: 2018-02-10 — End: 2018-02-10

## 2018-02-11 ENCOUNTER — Telehealth: Payer: Self-pay

## 2018-02-11 NOTE — Telephone Encounter (Signed)
TOC #1. Called pt to f/u after d/c from [facility] on 02/10/18. Also wanted to confirm her hosp f/u appt w/ Dr. [name] on 02/16/18 @ 3:30pm. Discharge planning includes the following:  - Continue ASA, metoprolol BID, Maxzide, omeprazole, atorvastatin, tizanidine, SL Nitro prn, T#3 and T#4 - May want to consider ezetimibe OP - pick up new prescription for Mylanta, take q6hr prn indigestion - Continue heart healthy diet - f/u Dr. [name] As part of the [facility] f/u call, I am also wanting to discuss/review the above information with the pt to ensure all of the above have been taken care of and that she has no questions or concerns re: her care/discharge instructions. LVM requesting returned call.

## 2018-02-12 ENCOUNTER — Telehealth: Payer: Self-pay

## 2018-02-12 NOTE — Telephone Encounter (Signed)
Transition Care Management Follow-Up Telephone Call   Date discharged and where: 02/10/18 from [facility]  How have you been since you were released from the hospital?  States she is still having some indigestion but noticed improvement when taking Mylanta. Also states that she has started a new diet to help reduce her symptoms of indigestion. Currently has lost 7 pounds.   Further states she is planning to raise the head of her bed to help reduce symptoms of acid reflux at night. Advised she may want to consider taking her omeprazole in the evening to see if this may help alleviate her symptoms as well.   Asked if she has established with a GI specialist. States she will likely schedule an appt with her GI after seeing Dr. [name] on 02/16/18. States she had went through a similar experience in the past and wonders if it may be returning again.  Pt reassured to continue with weight loss, dietary changes, symptom reduction efforts, and to continue with medications as prescribed.   Any patient concerns? No concerns voiced at the time of this call.  Items Reviewed: Verified = V   Meds: V   Allergies: V  Dietary Orders: Heart Healthy diet. Verbalized acceptance and understanding about dietary limitations and restrictions.  Functional Questionnaire:  Independent = I Dependent = D  ADLs: I  Dressing- I   Eating- I  Maintaining continence- I  Transferring- I  Transportation- I  Meal Prep- I  Managing Meds- I  Additional Items Reviewed:  Hospital f/u appt confirmed? Yes. Scheduled to see Dr. [name] on 02/16/18 @ 3:30pm. Denies need for transportation arrangements.  If their condition worsens, is the pt aware to call PCP or go to the Emergency Dept.? Yes. Verbalized acceptance and understanding  Was the patient provided with contact information for the PCP's office or ED? Yes  Was to pt encouraged to call back with questions or concerns? Yes

## 2018-02-16 ENCOUNTER — Encounter: Payer: Self-pay | Admitting: Internal Medicine

## 2018-02-16 ENCOUNTER — Ambulatory Visit: Payer: Medicare Other | Admitting: Internal Medicine

## 2018-02-16 VITALS — BP 128/80 | HR 76 | Ht 62.0 in | Wt 174.0 lb

## 2018-02-16 DIAGNOSIS — K219 Gastro-esophageal reflux disease without esophagitis: Secondary | ICD-10-CM

## 2018-02-16 DIAGNOSIS — R079 Chest pain, unspecified: Secondary | ICD-10-CM | POA: Diagnosis not present

## 2018-02-16 DIAGNOSIS — I10 Essential (primary) hypertension: Secondary | ICD-10-CM | POA: Diagnosis not present

## 2018-02-16 NOTE — Progress Notes (Signed)
Date:  02/16/2018   Name:  Jordan Baxter   DOB:  Dec 16, 1950   MRN:  [id number]   Chief Complaint: Follow-up (hospital - in ER for observation overnight and to do a stress test the next day. " It was ok" Dx with pulled muscles and GERD)  Hospital follow up; admitted to [facility] 02/09/18 to 02/10/09 for chest pain.   Received TOC call on 02/12/18. D/C summary: Patient admitted for atypical chest pain and found to have positive nuclear stress test. After review by cardiologist Dr. Margaretha Glassing, the nuclear stress is normal and the defects seen on NM stress are likely related to attenuation artifact. Discharged home with pcp follow-up for non cardiac chest pain work-up.  Recommended Follow-up Studies: None   No medication changes were made.  She is followed by [facility] heart cardiology. Chest pain felt to be muscular and GERD.  Her muscular component has improved with rest. GERD is persistent despite omeprazole and liquid antacid with each meal.  She has hx of exophagitis - last EGD about 7-8 years ago in [place].  She is unsure of H Pylori testing. HTN remains controlled - no change in medications.   Review of Systems  Constitutional: Negative for chills, fatigue, fever and unexpected weight change.  HENT: Negative for trouble swallowing.   Respiratory: Negative for cough, chest tightness, shortness of breath and wheezing.   Cardiovascular: Positive for chest pain. Negative for palpitations.  Gastrointestinal: Positive for abdominal pain and nausea. Negative for constipation, diarrhea and vomiting.  Neurological: Negative for dizziness.    Patient Active Problem List   Diagnosis Date Noted  . Chest pain 02/16/2018  . BMI 32.0-32.9,adult 05/21/2016  . Tobacco use disorder, moderate, in sustained remission 04/23/2016  . H/O cardiac catheterization 03/20/2015  . Coronary artery disease involving native heart with angina pectoris (HCC) 03/19/2015  . Essential (primary) hypertension 03/19/2015  .  Fibrositis 03/19/2015  . Gastro-esophageal reflux disease without esophagitis 03/19/2015  . Hyperlipidemia, mixed 03/19/2015  . H/O arthrodesis 03/19/2015    Prior to Admission medications   Medication Sig Start Date End Date Taking? Authorizing Provider  acetaminophen-codeine (TYLENOL #3) 300-30 MG tablet Take by mouth every 4 (four) hours as needed for moderate pain.    [provider]  acetaminophen-codeine (TYLENOL #4) 300-60 MG per tablet Take 1-2 tablets by mouth 3 (three) times daily as needed. 05/14/15   [provider]  alum & mag hydroxide-simeth (ALMACONE DOUBLE STRENGTH) 400-400-40 MG/5ML suspension Take 15 mLs by mouth every 6 (six) hours as needed. 02/10/18   [provider]  aspirin 81 MG tablet Take 1 tablet by mouth daily.    [provider]  atorvastatin (LIPITOR) 80 MG tablet Take 1 tablet by mouth daily.    [provider]  Calcium-Vitamin D 600-200 MG-UNIT per tablet Take 1 tablet by mouth 2 (two) times daily.    [provider]  fluticasone (FLONASE) 50 MCG/ACT nasal spray Place 2 sprays into both nostrils daily. 02/19/17   Reubin Milan, MD  lidocaine (LIDODERM) 5 % Place 1 patch onto the skin daily. 01/22/17   [provider]  metoprolol tartrate (LOPRESSOR) 25 MG tablet Take 0.5 tablets by mouth 2 (two) times daily.    [provider]  nitroGLYCERIN (NITROSTAT) 0.4 MG SL tablet Place 1 tablet under the tongue daily as needed.    [provider]  omeprazole (PRILOSEC) 20 MG capsule Take 1 capsule (20 mg total) daily by mouth. 08/31/17  Reubin Milan, MD  tiZANidine (ZANAFLEX) 4 MG tablet Take 4 mg by mouth 3 (three) times daily.  07/31/15   [provider]  triamterene-hydrochlorothiazide (MAXZIDE-25) 37.5-25 MG tablet Take 1 tablet by mouth daily. 03/03/17   Reubin Milan, MD  umeclidinium-vilanterol (ANORO ELLIPTA) 62.5-25 MCG/INH AEPB Inhale 1 puff into the lungs daily.  12/15/17   Reubin Milan, MD    Allergies  Allergen Reactions  . Cephalosporins   . Ibuprofen Nausea Only  . Penicillins   . Sulfa Antibiotics Photosensitivity    Past Surgical History:  Procedure Laterality Date  . CARPAL TUNNEL RELEASE Right   . CERVICAL FUSION  2015  . KNEE ARTHROSCOPY Right   . PARTIAL HYSTERECTOMY    . ROTATOR CUFF REPAIR Left     Social History   Tobacco Use  . Smoking status: Former Smoker    Last attempt to quit: 03/13/2012    Years since quitting: 5.9  . Smokeless tobacco: Never Used  Substance Use Topics  . Alcohol use: No    Alcohol/week: 0.0 oz  . Drug use: No     Medication list has been reviewed and updated.  PHQ 2/9 Scores 07/30/2017 08/22/2016 08/22/2015  PHQ - 2 Score 0 0 0    Physical Exam  Constitutional: She is oriented to person, place, and time. She appears well-developed. No distress.  HENT:  Head: Normocephalic and atraumatic.  Neck: Normal range of motion. Neck supple.  Cardiovascular: Normal rate, regular rhythm and normal heart sounds.  Pulmonary/Chest: Effort normal and breath sounds normal. No respiratory distress. She exhibits no tenderness.  Abdominal: Soft. Normal appearance. There is tenderness (very mild) in the epigastric area. There is no rigidity, no guarding and negative Murphy's sign.  Musculoskeletal: Normal range of motion.  Neurological: She is alert and oriented to person, place, and time.  Skin: Skin is warm and dry. No rash noted.  Psychiatric: She has a normal mood and affect. Her behavior is normal. Thought content normal.    BP 128/80   Pulse 76   Ht 5\' 2"  (1.575 m)   Wt 174 lb (78.9 kg)   BMI 31.83 kg/m   Assessment and Plan: 1. Chest pain, unspecified type Likely muscular - continue rest and modified activities  2. Gastro-esophageal reflux disease without esophagitis Check H Pylori Increase omeprazole to 20 mg bid Consider GI referral for EGD (has seen Dr. Earlean Polka in the past) -  HELICOBACTER PYLORI  ANTIBODY, IGM  3. Essential (primary) hypertension controlled   No orders of the defined types were placed in this encounter.   Partially dictated using Animal nutritionist. Any errors are unintentional.  Bari Edward, MD [facility]  02/16/2018

## 2018-02-16 NOTE — Patient Instructions (Signed)
Stop liquid antacid  Double omeprazole to twice a day

## 2018-02-17 LAB — HELICOBACTER PYLORI  ANTIBODY, IGM: H pylori, IgM Abs: <9 units (ref 0.0–8.9)

## 2018-03-02 ENCOUNTER — Other Ambulatory Visit: Payer: Self-pay | Admitting: Internal Medicine

## 2018-03-02 DIAGNOSIS — R0602 Shortness of breath: Secondary | ICD-10-CM

## 2018-03-09 ENCOUNTER — Other Ambulatory Visit: Payer: Self-pay

## 2018-03-09 MED ORDER — OMEPRAZOLE 20 MG PO CPDR: 20.0000 mg | DELAYED_RELEASE_CAPSULE | Freq: Two times a day (BID) | ORAL | 3 refills | Status: DC

## 2018-05-02 IMAGING — CR DG CHEST 2V
2 series · 2 of 2 positions shown · non-contrast
Comparison: None

CLINICAL DATA: Shortness of breath and wheezing for 1 year worsened
recently, productive cough, anterior chest tightness, coronary
stents, former smoker, hypertension

EXAM:
CHEST  2 VIEW

[chest pa]
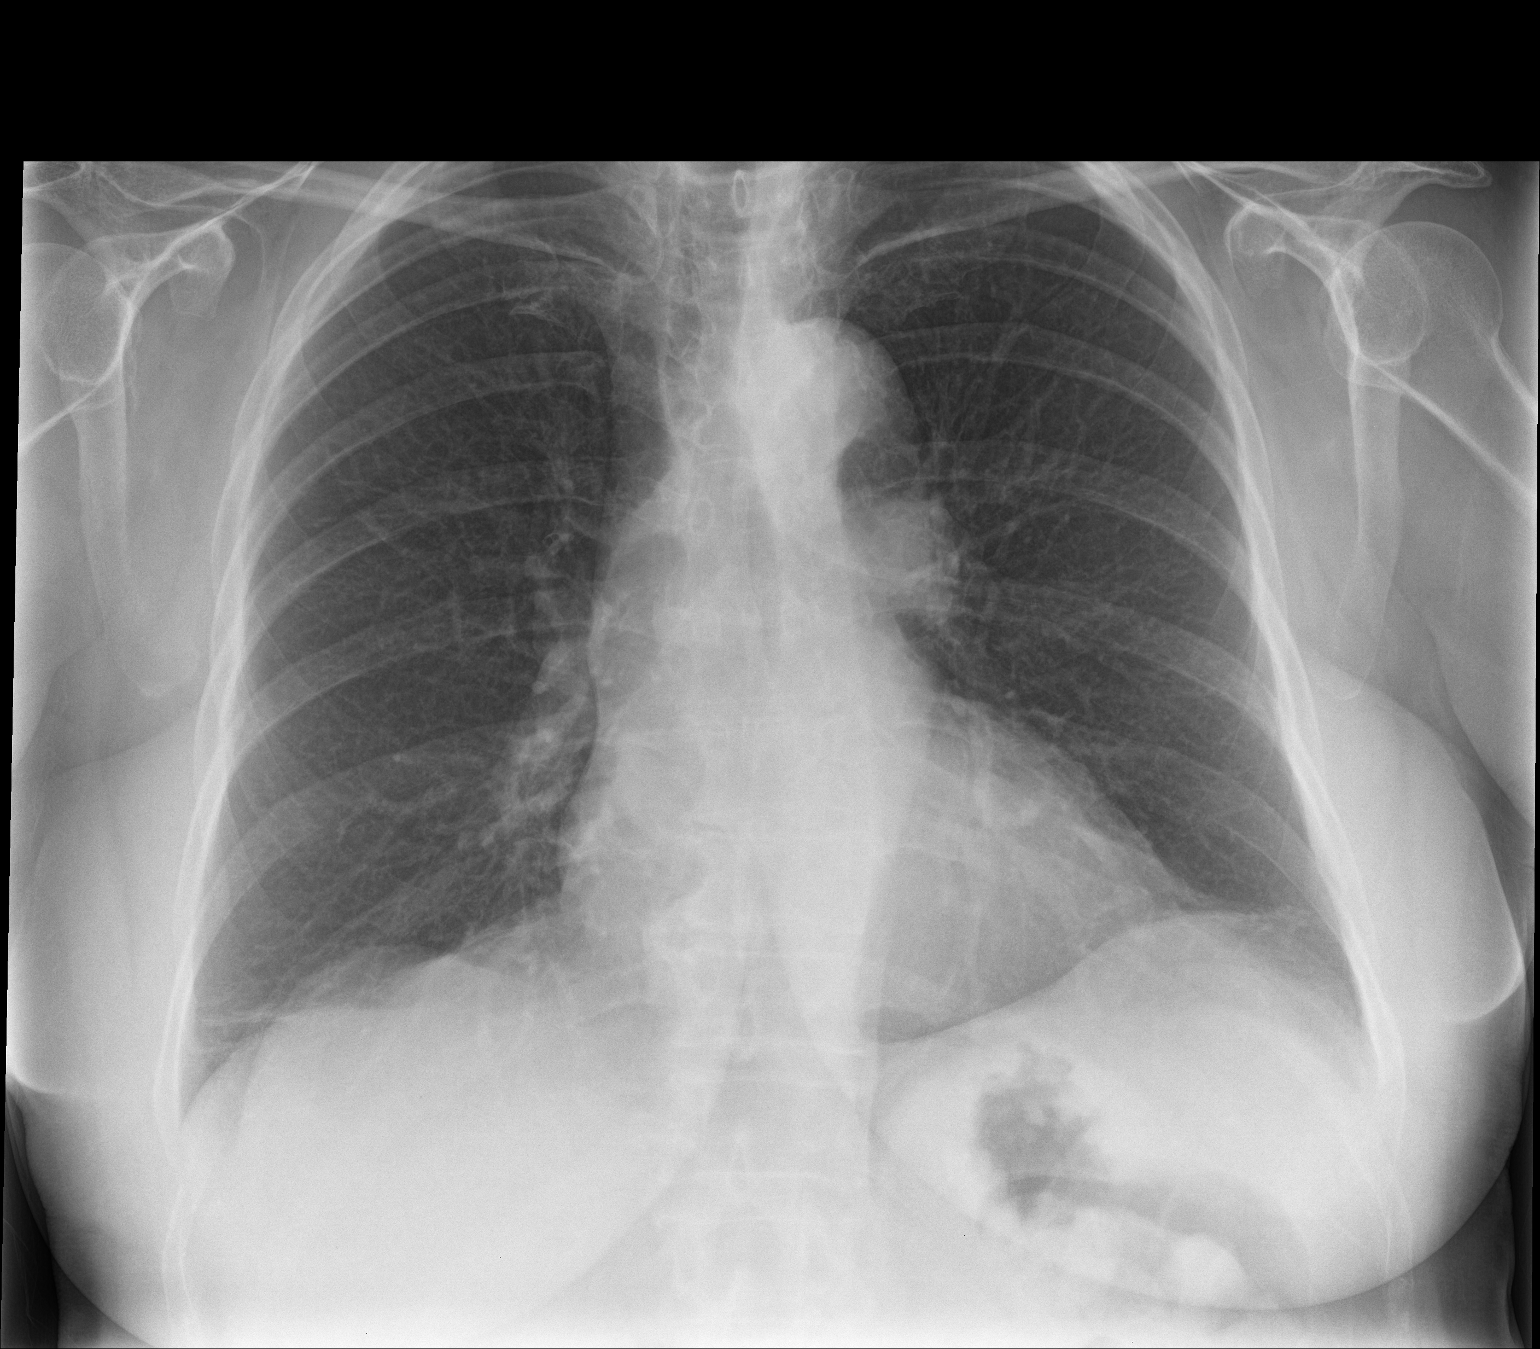

[chest lat]
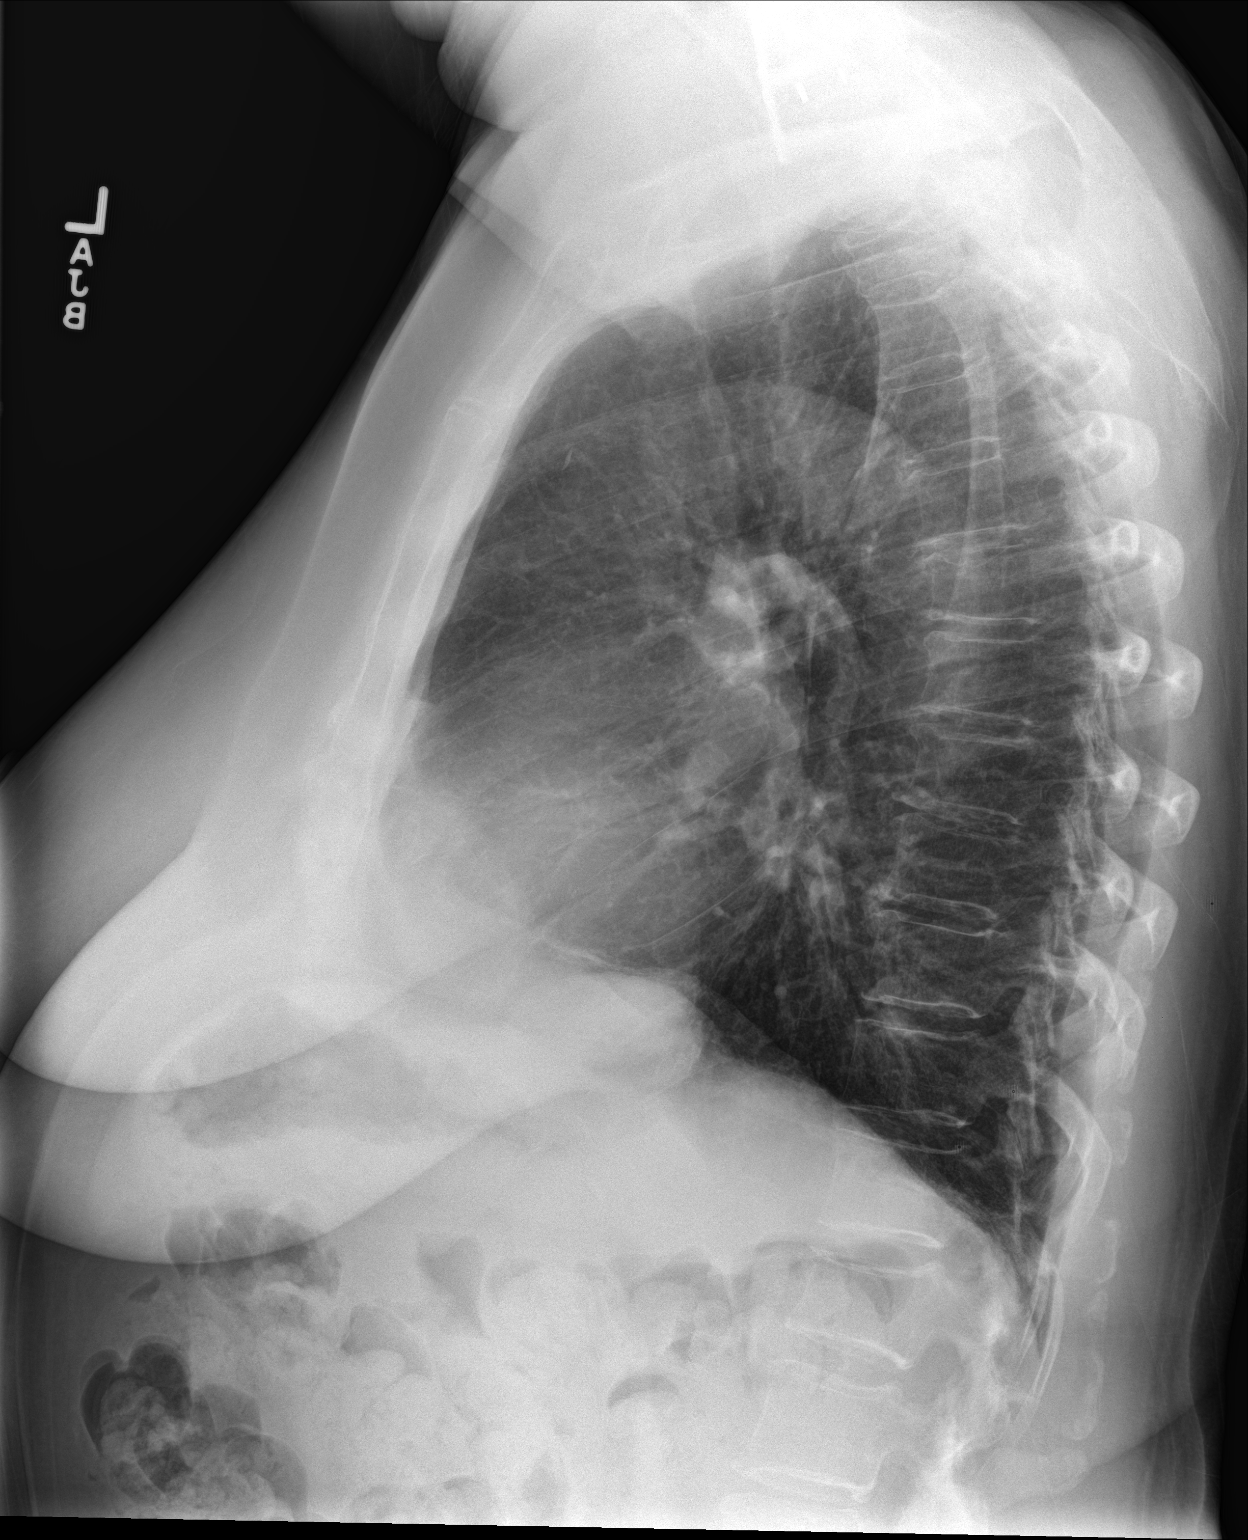

[2 of 2 positions shown; findings below may reference images not displayed]

FINDINGS: Upper normal size of cardiac silhouette.

Mediastinal contours and pulmonary vascularity normal.

Subsegmental atelectasis at anterior lung bases.

Lungs otherwise clear.

No acute infiltrate, pleural effusion or pneumothorax.

Diffuse osseous demineralization.

Prior cervicothoracic fusion.

Bones appear demineralized.

Superior endplate height losses of T6 and L1 vertebra, appear old.
IMPRESSION: Anterior bibasilar atelectasis.

## 2018-05-31 ENCOUNTER — Other Ambulatory Visit: Payer: Self-pay | Admitting: Internal Medicine

## 2018-05-31 DIAGNOSIS — R600 Localized edema: Secondary | ICD-10-CM

## 2018-07-15 ENCOUNTER — Telehealth: Payer: Self-pay | Admitting: Internal Medicine

## 2018-07-15 NOTE — Telephone Encounter (Signed)
LM to resched AWV c/b [phone] °Kathryn Brown °Care Guide ° ° °

## 2018-08-04 ENCOUNTER — Ambulatory Visit: Payer: Medicare Other

## 2018-08-04 ENCOUNTER — Telehealth: Payer: Self-pay | Admitting: Internal Medicine

## 2018-08-04 NOTE — Telephone Encounter (Signed)
Called to schedule Medicare Annual Wellness Visit with the Nurse Health Advisor.   If patient returns call, please note: their last AWV was on 07/30/17 please schedule AWV with [facility] any date AFTER 07/30/2018  Thank you! For any questions please contact: Manuela Schwartz [phone] or Skype at: [email or web address]

## 2018-08-05 ENCOUNTER — Ambulatory Visit: Payer: Medicare Other

## 2018-08-25 ENCOUNTER — Ambulatory Visit (INDEPENDENT_AMBULATORY_CARE_PROVIDER_SITE_OTHER): Payer: Medicare Other

## 2018-08-25 VITALS — BP 122/78 | HR 64 | Temp 98.3°F | Resp 14 | Ht 62.0 in | Wt 169.4 lb

## 2018-08-25 DIAGNOSIS — Z23 Encounter for immunization: Secondary | ICD-10-CM

## 2018-08-25 DIAGNOSIS — Z0000 Encounter for general adult medical examination without abnormal findings: Secondary | ICD-10-CM

## 2018-08-25 NOTE — Progress Notes (Signed)
Subjective:   Jordan Baxter is a 67 y.o. female who presents for Medicare Annual (Subsequent) preventive examination.  Review of Systems:   Cardiac Risk Factors include: advanced age (>87men, >5 women);dyslipidemia;hypertension;obesity (BMI >30kg/m2)     Objective:     Vitals: BP 122/78 (BP Location: Left Arm, Patient Position: Sitting, Cuff Size: Normal)   Pulse 64   Temp 98.3 F (36.8 C) (Oral)   Resp 14   Ht 5\' 2"  (1.575 m)   Wt 169 lb 6.4 oz (76.8 kg)   BMI 30.98 kg/m   Body mass index is 30.98 kg/m.  Advanced Directives 07/30/2017  Does Patient Have a Medical Advance Directive? Yes  Type of Estate agent of [place];Living will  Copy of Healthcare Power of Attorney in Chart? No - copy requested    Tobacco Social History   Tobacco Use  Smoking Status Former Smoker  . Packs/day: 0.00  . Years: 0.00  . Pack years: 0.00  . Last attempt to quit: 03/13/2012  . Years since quitting: 6.4  Smokeless Tobacco Never Used     Counseling given: Not Answered   Clinical Intake:  Pre-visit preparation completed: Yes  Pain : 0-10 Pain Score: 4  Pain Type: Chronic pain Pain Location: Back(shoulders) Pain Orientation: Mid, Lower, Right, Left Pain Descriptors / Indicators: Aching(fibromyalgia pain) Pain Onset: More than a month ago Pain Frequency: Constant Pain Relieving Factors: pain medication  Pain Relieving Factors: pain medication  Nutritional Status: BMI > 30  Obese Nutritional Risks: None Diabetes: No  How often do you need to have someone help you when you read instructions, pamphlets, or other written materials from your doctor or pharmacy?: 1 - Never What is the last grade level you completed in school?: some college  Interpreter Needed?: No  Information entered by :: Reather Littler LPN  Past Medical History:  Diagnosis Date  . Allergy   . Arthritis   . Fibrositis   . GERD (gastroesophageal reflux disease)   . Hyperlipidemia     . Hypertension    Past Surgical History:  Procedure Laterality Date  . ABDOMINAL HYSTERECTOMY  1986  . CARPAL TUNNEL RELEASE Right   . CERVICAL FUSION  2015  . CESAREAN SECTION  1984  . KNEE ARTHROSCOPY Right   . PARTIAL HYSTERECTOMY    . ROTATOR CUFF REPAIR Left    Family History  Problem Relation Age of Onset  . CAD Mother   . Heart disease Mother   . Cancer Father   . Heart disease Sister   . Heart attack Sister   . Heart disease Brother   . Heart attack Sister   . Arthritis Paternal Grandmother    Social History   Socioeconomic History  . Marital status: Divorced    Spouse name: Not on file  . Number of children: 1  . Years of education: some college  . Highest education level: Not on file  Occupational History  . Occupation: retired  Engineer, production  . Financial resource strain: Not hard at all  . Food insecurity:    Worry: Never true    Inability: Never true  . Transportation needs:    Medical: No    Non-medical: No  Tobacco Use  . Smoking status: Former Smoker    Packs/day: 0.00    Years: 0.00    Pack years: 0.00    Last attempt to quit: 03/13/2012    Years since quitting: 6.4  . Smokeless tobacco: Never Used  Substance and  Sexual Activity  . Alcohol use: No    Alcohol/week: 0.0 standard drinks  . Drug use: No  . Sexual activity: Never  Lifestyle  . Physical activity:    Days per week: 3 days    Minutes per session: 30 min  . Stress: Patient refused  Relationships  . Social connections:    Talks on phone: More than three times a week    Gets together: More than three times a week    Attends religious service: More than 4 times per year    Active member of club or organization: Yes    Attends meetings of clubs or organizations: More than 4 times per year    Relationship status: Divorced  Other Topics Concern  . Not on file  Social History Narrative  . Not on file    Outpatient Encounter Medications as of 08/25/2018  Medication Sig  .  acetaminophen-codeine (TYLENOL #3) 300-30 MG tablet Take by mouth every 4 (four) hours as needed for moderate pain.  Marland Kitchen acetaminophen-codeine (TYLENOL #4) 300-60 MG per tablet Take 1-2 tablets by mouth 3 (three) times daily as needed.  Marland Kitchen aspirin 81 MG tablet Take 1 tablet by mouth daily.  . Calcium-Vitamin D 600-200 MG-UNIT per tablet Take 1 tablet by mouth 2 (two) times daily.  . fluticasone (FLONASE) 50 MCG/ACT nasal spray SHAKE LIQUID AND USE 2 SPRAYS IN EACH NOSTRIL DAILY  . lidocaine (LIDODERM) 5 % Place 1 patch onto the skin daily.  . metoprolol tartrate (LOPRESSOR) 25 MG tablet Take 0.5 tablets by mouth 2 (two) times daily.  . nitroGLYCERIN (NITROSTAT) 0.4 MG SL tablet Place 1 tablet under the tongue daily as needed.  Marland Kitchen omeprazole (PRILOSEC) 20 MG capsule Take 1 capsule (20 mg total) by mouth 2 (two) times daily before a meal.  . rosuvastatin (CRESTOR) 40 MG tablet TK 1 T PO ONCE D.  . sucralfate (CARAFATE) 1 g tablet TK 1 T PO D  . tiZANidine (ZANAFLEX) 4 MG tablet Take 4 mg by mouth 3 (three) times daily.   Marland Kitchen triamterene-hydrochlorothiazide (MAXZIDE-25) 37.5-25 MG tablet TAKE 1 TABLET BY MOUTH DAILY  . umeclidinium-vilanterol (ANORO ELLIPTA) 62.5-25 MCG/INH AEPB Inhale 1 puff into the lungs daily.  Marland Kitchen acetaminophen-codeine (TYLENOL #4) 300-60 MG tablet Take by mouth.  Marland Kitchen atorvastatin (LIPITOR) 80 MG tablet Take 1 tablet by mouth daily.  Monte Fantasia INHUB 100-50 MCG/DOSE AEPB INL 1 PUFF ITL BID   No facility-administered encounter medications on file as of 08/25/2018.     Activities of Daily Living In your present state of health, do you have any difficulty performing the following activities: 08/25/2018  Hearing? N  Comment declines hearing aids  Vision? N  Comment wears glasses  Difficulty concentrating or making decisions? N  Walking or climbing stairs? N  Dressing or bathing? N  Doing errands, shopping? N  Preparing Food and eating ? N  Using the Toilet? N  In the past six  months, have you accidently leaked urine? N  Do you have problems with loss of bowel control? N  Managing your Medications? N  Managing your Finances? N  Housekeeping or managing your Housekeeping? N  Some recent data might be hidden    Patient Care Team: Reubin Milan, MD as PCP - General (Internal Medicine) Loren Racer, MD as Referring Physician (Physical Medicine and Rehabilitation) Laretta Bolster, MD as Referring Physician (Cardiology) Lacey Jensen, MD as Consulting Physician (Gastroenterology)    Assessment:   This is a routine  wellness examination for Jordan Baxter.  Exercise Activities and Dietary recommendations Current Exercise Habits: Home exercise routine, Type of exercise: Other - see comments(elliptical, treadmill), Time (Minutes): 30, Frequency (Times/Week): 3, Weekly Exercise (Minutes/Week): 90, Intensity: Mild, Exercise limited by: Other - see comments(fibromyalgia)  Goals    . Increase physical activity     Recommend increasing physical activity 3 days per week for 30 minutes.     . Reduce sugar intake to X grams per day     Recommend to eliminate sweets from your diet       Fall Risk Fall Risk  08/25/2018 07/30/2017 08/22/2016 08/22/2015  Falls in the past year? 1 Yes No No  Number falls in past yr: 1 1 - -  Injury with Fall? 0 No - -  Risk for fall due to : - Medication side effect - -  Follow up - Education provided;Falls prevention discussed - -   FALL RISK PREVENTION PERTAINING TO THE HOME:  Any stairs in or around the home WITH handrails? Yes  Home free of loose throw rugs in walkways, pet beds, electrical cords, etc? Yes  Adequate lighting in your home to reduce risk of falls? Yes   ASSISTIVE DEVICES UTILIZED TO PREVENT FALLS:  Life alert? NO Use of a cane, walker or w/c? No  Grab bars in the bathroom? Yes  Shower chair or bench in shower? No  Elevated toilet seat or a handicapped toilet? No   DME ORDERS:  DME order needed?  No   TIMED  UP AND GO:  Was the test performed? Yes .  Length of time to ambulate 10 feet: 7 sec.   GAIT:  Appearance of gait: Gait stead-fast and without the use of an assistive device.  Education: Fall risk prevention has been discussed.  Intervention(s) required? No   Depression Screen PHQ 2/9 Scores 08/25/2018 07/30/2017 08/22/2016 08/22/2015  PHQ - 2 Score 0 0 0 0     Cognitive Function      6CIT Screen 08/25/2018 07/30/2017 08/22/2016  What Year? 0 points 0 points 0 points  What month? 0 points 0 points 0 points  What time? 0 points 0 points 0 points  Count back from 20 0 points 0 points 0 points  Months in reverse 0 points 0 points 0 points  Repeat phrase 0 points 0 points 0 points  Total Score 0 0 0    Immunization History  Administered Date(s) Administered  . Influenza, High Dose Seasonal PF 08/25/2018  . Influenza,inj,Quad PF,6+ Mos 07/30/2017  . Influenza-Unspecified 07/22/2015  . Pneumococcal Conjugate-13 08/22/2016  . Pneumococcal Polysaccharide-23 11/13/2009, 07/30/2017  . Tdap 05/03/2014  . Zoster 10/15/2011  . Zoster Recombinat (Shingrix) 11/03/2017, 01/01/2018    Qualifies for Shingles Vaccine? Shingrix completed 01/01/18  Tdap: Up to date  Flu Vaccine: Due for Flu vaccine. Does the patient want to receive this vaccine today?  Yes .   Pneumococcal Vaccine:Up to date  Screening Tests Health Maintenance  Topic Date Due  . INFLUENZA VACCINE  05/13/2018  . MAMMOGRAM  01/02/2019  . COLONOSCOPY  10/14/2020  . TETANUS/TDAP  05/03/2024  . DEXA SCAN  Completed  . Hepatitis C Screening  Completed  . PNA vac Low Risk Adult  Completed    Cancer Screenings:  Colorectal Screening: Completed 10/14/2010. Repeat every 10 years;   Mammogram: Completed 01/01/18. Repeat every year; Dr. [name] to order after breast exam.   Bone Density: Completed 07/01/16. Results reflect OSTEOPOROSIS. Pt would like to repeat once insurance  allows, she plans to contact them and will  request an order when needed.  Lung Cancer Screening: (Low Dose CT Chest recommended if Age 22-80 years, 30 pack-year currently smoking OR have quit w/in 15years.) does not qualify.    Additional Screening:  Hepatitis C Screening: does qualify; Completed 02/19/17  Vision Screening: Recommended annual ophthalmology exams for early detection of glaucoma and other disorders of the eye. Is the patient up to date with their annual eye exam?  Yes  Who is the provider or what is the name of the office in which the pt attends annual eye exams? [facility]  Dental Screening: Recommended annual dental exams for proper oral hygiene  Community Resource Referral:  CRR required this visit?  No      Plan:    I have personally reviewed and addressed the Medicare Annual Wellness questionnaire and have noted the following in the patient's chart:  A. Medical and social history B. Use of alcohol, tobacco or illicit drugs  C. Current medications and supplements D. Functional ability and status E.  Nutritional status F.  Physical activity G. Advance directives H. List of other physicians I.  Hospitalizations, surgeries, and ER visits in previous 12 months J.  Vitals K. Screenings such as hearing and vision if needed, cognitive and depression L. Referrals and appointments   In addition, I have reviewed and discussed with patient certain preventive protocols, quality metrics, and best practice recommendations. A written personalized care plan for preventive services as well as general preventive health recommendations were provided to patient.   Signed,  Reather Littler, LPN Nurse Health Advisor   Nurse Notes: pt's mammogram report stated return in one year, please order after breast exam at next visit on 09/01/18. She has previously gone to [facility]. Thank you!

## 2018-08-25 NOTE — Patient Instructions (Signed)
Jordan Baxter , Thank you for taking time to come for your Medicare Wellness Visit. I appreciate your ongoing commitment to your health goals. Please review the following plan we discussed and let me know if I can assist you in the future.   Screening recommendations/referrals: Colonoscopy: done 10/14/10 Mammogram: done 01/01/18 Bone Density: done 07/01/16 Recommended yearly ophthalmology/optometry visit for glaucoma screening and checkup Recommended yearly dental visit for hygiene and checkup  Vaccinations: Influenza vaccine: done today Pneumococcal vaccine: done 07/30/17 Tdap vaccine: done 05/03/14 Shingles vaccine: done 01/01/18    Advanced directives: Please bring a copy of your health care power of attorney and living will to the office at your convenience.  Conditions/risks identified: recommend increasing physical activity to 30 minutes per day 3 days per week  Next appointment: 09/01/18 Dr, [name] 9:00Influenza (Flu) Vaccine (Inactivated or Recombinant): What You Need to Know 1. Why get vaccinated? Influenza ("flu") is a contagious disease that spreads around the [place] every year, usually between October and May. Flu is caused by influenza viruses, and is spread mainly by coughing, sneezing, and close contact. Anyone can get flu. Flu strikes suddenly and can last several days. Symptoms vary by age, but can include: fever/chills sore throat muscle aches fatigue cough headache runny or stuffy nose  Flu can also lead to pneumonia and blood infections, and cause diarrhea and seizures in children. If you have a medical condition, such as heart or lung disease, flu can make it worse. Flu is more dangerous for some people. Infants and young children, people 45 years of age and older, pregnant women, and people with certain health conditions or a weakened immune system are at greatest risk. Each year thousands of people in the [place] die from flu, and many more are  hospitalized. Flu vaccine can: keep you from getting flu, make flu less severe if you do get it, and keep you from spreading flu to your family and other people. 2. Inactivated and recombinant flu vaccines A dose of flu vaccine is recommended every flu season. Children 6 months through 72 years of age may need two doses during the same flu season. Everyone else needs only one dose each flu season. Some inactivated flu vaccines contain a very small amount of a mercury-based preservative called thimerosal. Studies have not shown thimerosal in vaccines to be harmful, but flu vaccines that do not contain thimerosal are available. There is no live flu virus in flu shots. They cannot cause the flu. There are many flu viruses, and they are always changing. Each year a new flu vaccine is made to protect against three or four viruses that are likely to cause disease in the upcoming flu season. But even when the vaccine doesn't exactly match these viruses, it may still provide some protection. Flu vaccine cannot prevent: flu that is caused by a virus not covered by the vaccine, or illnesses that look like flu but are not.  It takes about 2 weeks for protection to develop after vaccination, and protection lasts through the flu season. 3. Some people should not get this vaccine Tell the person who is giving you the vaccine: If you have any severe, life-threatening allergies. If you ever had a life-threatening allergic reaction after a dose of flu vaccine, or have a severe allergy to any part of this vaccine, you may be advised not to get vaccinated. Most, but not all, types of flu vaccine contain a small amount of egg protein. If you ever had Guillain-Barr  Syndrome (also called GBS). Some people with a history of GBS should not get this vaccine. This should be discussed with your doctor. If you are not feeling well. It is usually okay to get flu vaccine when you have a mild illness, but you might be asked to  come back when you feel better.  4. Risks of a vaccine reaction With any medicine, including vaccines, there is a chance of reactions. These are usually mild and go away on their own, but serious reactions are also possible. Most people who get a flu shot do not have any problems with it. Minor problems following a flu shot include: soreness, redness, or swelling where the shot was given hoarseness sore, red or itchy eyes cough fever aches headache itching fatigue  If these problems occur, they usually begin soon after the shot and last 1 or 2 days. More serious problems following a flu shot can include the following: There may be a small increased risk of Guillain-Barre Syndrome (GBS) after inactivated flu vaccine. This risk has been estimated at 1 or 2 additional cases per million people vaccinated. This is much lower than the risk of severe complications from flu, which can be prevented by flu vaccine. Young children who get the flu shot along with pneumococcal vaccine (PCV13) and/or DTaP vaccine at the same time might be slightly more likely to have a seizure caused by fever. Ask your doctor for more information. Tell your doctor if a child who is getting flu vaccine has ever had a seizure.  Problems that could happen after any injected vaccine: People sometimes faint after a medical procedure, including vaccination. Sitting or lying down for about 15 minutes can help prevent fainting, and injuries caused by a fall. Tell your doctor if you feel dizzy, or have vision changes or ringing in the ears. Some people get severe pain in the shoulder and have difficulty moving the arm where a shot was given. This happens very rarely. Any medication can cause a severe allergic reaction. Such reactions from a vaccine are very rare, estimated at about 1 in a million doses, and would happen within a few minutes to a few hours after the vaccination. As with any medicine, there is a very remote chance  of a vaccine causing a serious injury or death. The safety of vaccines is always being monitored. For more information, visit: http://floyd.org/ 5. What if there is a serious reaction? What should I look for? Look for anything that concerns you, such as signs of a severe allergic reaction, very high fever, or unusual behavior. Signs of a severe allergic reaction can include hives, swelling of the face and throat, difficulty breathing, a fast heartbeat, dizziness, and weakness. These would start a few minutes to a few hours after the vaccination. What should I do? If you think it is a severe allergic reaction or other emergency that can't wait, call 9-1-1 and get the person to the nearest hospital. Otherwise, call your doctor. Reactions should be reported to the Vaccine Adverse Event Reporting System (VAERS). Your doctor should file this report, or you can do it yourself through the VAERS web site at www.vaers.LAgents.no, or by calling 1-(825)411-1548. VAERS does not give medical advice. 6. The National Vaccine Injury Compensation Program The [employer] Vaccine Injury Compensation Program (VICP) is a federal program that was created to compensate people who may have been injured by certain vaccines. Persons who believe they may have been injured by a vaccine can learn about the program  and about filing a claim by calling 1-8502755828 or visiting the VICP website at SpiritualWord.at. There is a time limit to file a claim for compensation. 7. How can I learn more? Ask your healthcare provider. He or she can give you the vaccine package insert or suggest other sources of information. Call your local or state health department. Contact the Centers for Disease Control and Prevention (CDC): Call 573-208-0546 (1-800-CDC-INFO) or Visit CDC's website at BiotechRoom.com.cy Vaccine Information Statement, Inactivated Influenza Vaccine (05/19/2014) This information is not intended to  replace advice given to you by your health care provider. Make sure you discuss any questions you have with your health care provider. Document Released: 07/24/2006 Document Revised: 06/19/2016 Document Reviewed: 06/19/2016 Elsevier Interactive Patient Education  2017 [employer].    Preventive Care 65 Years and Older, Female Preventive care refers to lifestyle choices and visits with your health care provider that can promote health and wellness. What does preventive care include?  A yearly physical exam. This is also called an annual well check.  Dental exams once or twice a year.  Routine eye exams. Ask your health care provider how often you should have your eyes checked.  Personal lifestyle choices, including:  Daily care of your teeth and gums.  Regular physical activity.  Eating a healthy diet.  Avoiding tobacco and drug use.  Limiting alcohol use.  Practicing safe sex.  Taking low-dose aspirin every day.  Taking vitamin and mineral supplements as recommended by your health care provider. What happens during an annual well check? The services and screenings done by your health care provider during your annual well check will depend on your age, overall health, lifestyle risk factors, and family history of disease. Counseling  Your health care provider may ask you questions about your:  Alcohol use.  Tobacco use.  Drug use.  Emotional well-being.  Home and relationship well-being.  Sexual activity.  Eating habits.  History of falls.  Memory and ability to understand (cognition).  Work and work Astronomer.  Reproductive health. Screening  You may have the following tests or measurements:  Height, weight, and BMI.  Blood pressure.  Lipid and cholesterol levels. These may be checked every 5 years, or more frequently if you are over 38 years old.  Skin check.  Lung cancer screening. You may have this screening every year starting at age 46 if  you have a 30-pack-year history of smoking and currently smoke or have quit within the past 15 years.  Fecal occult blood test (FOBT) of the stool. You may have this test every year starting at age 44.  Flexible sigmoidoscopy or colonoscopy. You may have a sigmoidoscopy every 5 years or a colonoscopy every 10 years starting at age 59.  Hepatitis C blood test.  Hepatitis B blood test.  Sexually transmitted disease (STD) testing.  Diabetes screening. This is done by checking your blood sugar (glucose) after you have not eaten for a while (fasting). You may have this done every 1-3 years.  Bone density scan. This is done to screen for osteoporosis. You may have this done starting at age 76.  Mammogram. This may be done every 1-2 years. Talk to your health care provider about how often you should have regular mammograms. Talk with your health care provider about your test results, treatment options, and if necessary, the need for more tests. Vaccines  Your health care provider may recommend certain vaccines, such as:  Influenza vaccine. This is recommended every year.  Tetanus,  diphtheria, and acellular pertussis (Tdap, Td) vaccine. You may need a Td booster every 10 years.  Zoster vaccine. You may need this after age 20.  Pneumococcal 13-valent conjugate (PCV13) vaccine. One dose is recommended after age 27.  Pneumococcal polysaccharide (PPSV23) vaccine. One dose is recommended after age 54. Talk to your health care provider about which screenings and vaccines you need and how often you need them. This information is not intended to replace advice given to you by your health care provider. Make sure you discuss any questions you have with your health care provider. Document Released: 10/26/2015 Document Revised: 06/18/2016 Document Reviewed: 07/31/2015 Elsevier Interactive Patient Education  2017 [employer].  Fall Prevention in the Home Falls can cause injuries. They can happen to  people of all ages. There are many things you can do to make your home safe and to help prevent falls. What can I do on the outside of my home?  Regularly fix the edges of walkways and driveways and fix any cracks.  Remove anything that might make you trip as you walk through a door, such as a raised step or threshold.  Trim any bushes or trees on the path to your home.  Use bright outdoor lighting.  Clear any walking paths of anything that might make someone trip, such as rocks or tools.  Regularly check to see if handrails are loose or broken. Make sure that both sides of any steps have handrails.  Any raised decks and porches should have guardrails on the edges.  Have any leaves, snow, or ice cleared regularly.  Use sand or salt on walking paths during winter.  Clean up any spills in your garage right away. This includes oil or grease spills. What can I do in the bathroom?  Use night lights.  Install grab bars by the toilet and in the tub and shower. Do not use towel bars as grab bars.  Use non-skid mats or decals in the tub or shower.  If you need to sit down in the shower, use a plastic, non-slip stool.  Keep the floor dry. Clean up any water that spills on the floor as soon as it happens.  Remove soap buildup in the tub or shower regularly.  Attach bath mats securely with double-sided non-slip rug tape.  Do not have throw rugs and other things on the floor that can make you trip. What can I do in the bedroom?  Use night lights.  Make sure that you have a light by your bed that is easy to reach.  Do not use any sheets or blankets that are too big for your bed. They should not hang down onto the floor.  Have a firm chair that has side arms. You can use this for support while you get dressed.  Do not have throw rugs and other things on the floor that can make you trip. What can I do in the kitchen?  Clean up any spills right away.  Avoid walking on wet  floors.  Keep items that you use a lot in easy-to-reach places.  If you need to reach something above you, use a strong step stool that has a grab bar.  Keep electrical cords out of the way.  Do not use floor polish or wax that makes floors slippery. If you must use wax, use non-skid floor wax.  Do not have throw rugs and other things on the floor that can make you trip. What can I do with  my stairs?  Do not leave any items on the stairs.  Make sure that there are handrails on both sides of the stairs and use them. Fix handrails that are broken or loose. Make sure that handrails are as long as the stairways.  Check any carpeting to make sure that it is firmly attached to the stairs. Fix any carpet that is loose or worn.  Avoid having throw rugs at the top or bottom of the stairs. If you do have throw rugs, attach them to the floor with carpet tape.  Make sure that you have a light switch at the top of the stairs and the bottom of the stairs. If you do not have them, ask someone to add them for you. What else can I do to help prevent falls?  Wear shoes that:  Do not have high heels.  Have rubber bottoms.  Are comfortable and fit you well.  Are closed at the toe. Do not wear sandals.  If you use a stepladder:  Make sure that it is fully opened. Do not climb a closed stepladder.  Make sure that both sides of the stepladder are locked into place.  Ask someone to hold it for you, if possible.  Clearly mark and make sure that you can see:  Any grab bars or handrails.  First and last steps.  Where the edge of each step is.  Use tools that help you move around (mobility aids) if they are needed. These include:  Canes.  Walkers.  Scooters.  Crutches.  Turn on the lights when you go into a dark area. Replace any light bulbs as soon as they burn out.  Set up your furniture so you have a clear path. Avoid moving your furniture around.  If any of your floors are  uneven, fix them.  If there are any pets around you, be aware of where they are.  Review your medicines with your doctor. Some medicines can make you feel dizzy. This can increase your chance of falling. Ask your doctor what other things that you can do to help prevent falls. This information is not intended to replace advice given to you by your health care provider. Make sure you discuss any questions you have with your health care provider. Document Released: 07/26/2009 Document Revised: 03/06/2016 Document Reviewed: 11/03/2014 Elsevier Interactive Patient Education  2017 [employer].

## 2018-08-29 ENCOUNTER — Other Ambulatory Visit: Payer: Self-pay | Admitting: Internal Medicine

## 2018-08-29 DIAGNOSIS — R600 Localized edema: Secondary | ICD-10-CM

## 2018-08-29 DIAGNOSIS — R0602 Shortness of breath: Secondary | ICD-10-CM

## 2018-08-31 HISTORY — PX: COLONOSCOPY WITH ESOPHAGOGASTRODUODENOSCOPY (EGD): SHX5779

## 2018-09-01 ENCOUNTER — Encounter: Payer: Self-pay | Admitting: Internal Medicine

## 2018-09-01 ENCOUNTER — Ambulatory Visit (INDEPENDENT_AMBULATORY_CARE_PROVIDER_SITE_OTHER): Payer: Medicare Other | Admitting: Internal Medicine

## 2018-09-01 VITALS — BP 132/72 | HR 62 | Ht 62.0 in | Wt 169.0 lb

## 2018-09-01 DIAGNOSIS — F17201 Nicotine dependence, unspecified, in remission: Secondary | ICD-10-CM | POA: Diagnosis not present

## 2018-09-01 DIAGNOSIS — E782 Mixed hyperlipidemia: Secondary | ICD-10-CM | POA: Diagnosis not present

## 2018-09-01 DIAGNOSIS — I25119 Atherosclerotic heart disease of native coronary artery with unspecified angina pectoris: Secondary | ICD-10-CM

## 2018-09-01 DIAGNOSIS — K219 Gastro-esophageal reflux disease without esophagitis: Secondary | ICD-10-CM | POA: Diagnosis not present

## 2018-09-01 DIAGNOSIS — Z1231 Encounter for screening mammogram for malignant neoplasm of breast: Secondary | ICD-10-CM | POA: Diagnosis not present

## 2018-09-01 DIAGNOSIS — I10 Essential (primary) hypertension: Secondary | ICD-10-CM

## 2018-09-01 DIAGNOSIS — Z0000 Encounter for general adult medical examination without abnormal findings: Secondary | ICD-10-CM

## 2018-09-01 DIAGNOSIS — R400 Somnolence: Secondary | ICD-10-CM

## 2018-09-01 LAB — POCT URINALYSIS DIPSTICK
BILIRUBIN UA: NEGATIVE
Blood, UA: NEGATIVE
Glucose, UA: NEGATIVE
KETONES UA: NEGATIVE
Leukocytes, UA: NEGATIVE
Nitrite, UA: NEGATIVE
PH UA: 6 (ref 5.0–8.0)
Protein, UA: NEGATIVE
Spec Grav, UA: 1.015 (ref 1.010–1.025)
UROBILINOGEN UA: 0.2 U/dL

## 2018-09-01 NOTE — Patient Instructions (Signed)
Take Tizanidine only at night If sleep issue continues, call for sleep study Discuss change in medication with pain managment

## 2018-09-01 NOTE — Progress Notes (Signed)
Date:  09/01/2018   Name:  Jordan Baxter   DOB:  [birth year]   MRN:  [id number]   Chief Complaint: Annual Exam (Breast Exam. Patient had MAW with Rosanne Sack last week. ) Jordan Baxter is a 67 y.o. female who presents today for her Complete Annual Exam. She feels fairly well. She reports exercising none. She reports she is sleeping fairly well. Mammogram is due in March. She denies breast issues. Immunizations are up to date.  She has noticed that she falls asleep inappropriately after taking tizanidine during the day.  Twice while driving and once in middle of eating dinner.  Hypertension  This is a chronic problem. The problem is controlled. Associated symptoms include chest pain (work up after CP earlier this year). Pertinent negatives include no headaches, palpitations or shortness of breath. Past treatments include beta blockers and diuretics. The current treatment provides significant improvement. Hypertensive end-organ damage includes CAD/MI.  Hyperlipidemia  The problem is controlled. Associated symptoms include chest pain (work up after CP earlier this year) and myalgias. Pertinent negatives include no shortness of breath. Current antihyperlipidemic treatment includes statins. The current treatment provides significant improvement of lipids. There are no compliance problems.   Gastroesophageal Reflux  She complains of chest pain (work up after CP earlier this year) and heartburn. She reports no abdominal pain, no coughing or no wheezing. This is a recurrent problem. The heartburn is located in the substernum. The heartburn is of mild intensity. Pertinent negatives include no fatigue. Risk factors include hiatal hernia. She has tried a PPI for the symptoms. The treatment provided significant relief. Past procedures include an EGD (done yesterday).  Cyst - she noticed a lump on her right labia last week.  It is not painful.  She has been unable to see it well. CAD - followed by Cardiology,  stable without chest pains or use of NTG. CBC and lipids done in April.  Lab Results  Component Value Date   CREATININE 0.75 08/31/2017   BUN 16 08/31/2017   NA 144 08/31/2017   K 5.0 08/31/2017   CL 99 08/31/2017   CO2 29 08/31/2017   Lab Results  Component Value Date   CHOL 159 01/19/2018   HDL 49 01/19/2018   LDLCALC 94 01/19/2018   TRIG 151 01/19/2018   CHOLHDL 3.0 08/31/2017     Review of Systems  Constitutional: Negative for chills, fatigue and fever.  HENT: Negative for congestion, hearing loss, tinnitus, trouble swallowing and voice change.   Eyes: Negative for visual disturbance.  Respiratory: Negative for cough, chest tightness, shortness of breath and wheezing.   Cardiovascular: Positive for chest pain (work up after CP earlier this year). Negative for palpitations and leg swelling.  Gastrointestinal: Positive for heartburn. Negative for abdominal pain, constipation, diarrhea and vomiting.  Endocrine: Negative for polydipsia and polyuria.  Genitourinary: Negative for dysuria, frequency, genital sores, vaginal bleeding and vaginal discharge.  Musculoskeletal: Positive for arthralgias, back pain and myalgias. Negative for gait problem and joint swelling.  Skin: Negative for color change and rash.       Cyst  Neurological: Negative for dizziness, tremors, light-headedness and headaches.  Hematological: Negative for adenopathy. Does not bruise/bleed easily.  Psychiatric/Behavioral: Positive for sleep disturbance (falls asleep very easily after dinner). Negative for dysphoric mood. The patient is not nervous/anxious.     Patient Active Problem List   Diagnosis Date Noted  . Chest pain 02/16/2018  . BMI 32.0-32.9,adult 05/21/2016  . Tobacco use disorder, moderate, in sustained  remission 04/23/2016  . H/O cardiac catheterization 03/20/2015  . Coronary artery disease involving native heart with angina pectoris (HCC) 03/19/2015  . Essential (primary) hypertension  03/19/2015  . Fibrositis 03/19/2015  . Gastro-esophageal reflux disease without esophagitis 03/19/2015  . Hyperlipidemia, mixed 03/19/2015  . H/O arthrodesis 03/19/2015    Allergies  Allergen Reactions  . Cephalosporins   . Ibuprofen Nausea Only  . Penicillins   . Sulfa Antibiotics Photosensitivity    Past Surgical History:  Procedure Laterality Date  . ABDOMINAL HYSTERECTOMY  1986  . CARPAL TUNNEL RELEASE Right   . CERVICAL FUSION  2015  . CESAREAN SECTION  1984  . KNEE ARTHROSCOPY Right   . PARTIAL HYSTERECTOMY    . ROTATOR CUFF REPAIR Left     Social History   Tobacco Use  . Smoking status: Former Smoker    Packs/day: 0.00    Years: 0.00    Pack years: 0.00    Last attempt to quit: 03/13/2012    Years since quitting: 6.4  . Smokeless tobacco: Never Used  Substance Use Topics  . Alcohol use: No    Alcohol/week: 0.0 standard drinks  . Drug use: No     Medication list has been reviewed and updated.  Current Meds  Medication Sig  . acetaminophen-codeine (TYLENOL #3) 300-30 MG tablet Take by mouth every 4 (four) hours as needed for moderate pain.  Marland Kitchen acetaminophen-codeine (TYLENOL #4) 300-60 MG per tablet Take 1-2 tablets by mouth 3 (three) times daily as needed.  Marland Kitchen acetaminophen-codeine (TYLENOL #4) 300-60 MG tablet Take by mouth.  Marland Kitchen aspirin 81 MG tablet Take 1 tablet by mouth daily.  Marland Kitchen atorvastatin (LIPITOR) 80 MG tablet Take 1 tablet by mouth daily.  . Calcium-Vitamin D 600-200 MG-UNIT per tablet Take 1 tablet by mouth 2 (two) times daily.  . fluticasone (FLONASE) 50 MCG/ACT nasal spray SHAKE LIQUID AND USE 2 SPRAYS IN EACH NOSTRIL DAILY  . lidocaine (LIDODERM) 5 % Place 1 patch onto the skin daily.  . metoprolol tartrate (LOPRESSOR) 25 MG tablet Take 0.5 tablets by mouth 2 (two) times daily.  . nitroGLYCERIN (NITROSTAT) 0.4 MG SL tablet Place 1 tablet under the tongue daily as needed.  Marland Kitchen omeprazole (PRILOSEC) 20 MG capsule Take 1 capsule (20 mg total) by mouth  2 (two) times daily before a meal.  . rosuvastatin (CRESTOR) 40 MG tablet TK 1 T PO ONCE D.  . sucralfate (CARAFATE) 1 g tablet TK 1 T PO D  . tiZANidine (ZANAFLEX) 4 MG tablet Take 4 mg by mouth 3 (three) times daily.   Marland Kitchen triamterene-hydrochlorothiazide (MAXZIDE-25) 37.5-25 MG tablet TAKE 1 TABLET BY MOUTH DAILY  . umeclidinium-vilanterol (ANORO ELLIPTA) 62.5-25 MCG/INH AEPB Inhale 1 puff into the lungs daily.  Monte Fantasia INHUB 100-50 MCG/DOSE AEPB INL 1 PUFF ITL BID    PHQ 2/9 Scores 08/25/2018 07/30/2017 08/22/2016 08/22/2015  PHQ - 2 Score 0 0 0 0    Physical Exam  Constitutional: She is oriented to person, place, and time. She appears well-developed. No distress.  HENT:  Head: Normocephalic and atraumatic.  Cardiovascular: Normal rate, regular rhythm and normal heart sounds.  Pulmonary/Chest: Effort normal and breath sounds normal. No respiratory distress.  Musculoskeletal:       Cervical back: She exhibits decreased range of motion and spasm.  Neurological: She is alert and oriented to person, place, and time.  Skin: Skin is warm and dry. No rash noted.     Psychiatric: She has a normal mood and  affect. Her behavior is normal. Thought content normal.  Nursing note and vitals reviewed.   BP 132/72 (BP Location: Right Arm, Patient Position: Sitting, Cuff Size: Normal)   Pulse 62   Ht 5\' 2"  (1.575 m)   Wt 169 lb (76.7 kg)   SpO2 94%   BMI 30.91 kg/m   Assessment and Plan: 1. Annual physical exam Continue efforts at diet and weight loss Warm compresses to cyst to promote complete drainage - POCT urinalysis dipstick  2. Encounter for screening mammogram for breast cancer - MM 3D SCREEN BREAST BILATERAL  3. Essential (primary) hypertension controlled - Comprehensive metabolic panel - TSH  4. Coronary artery disease involving native coronary artery of native heart with angina pectoris (HCC) Stable, followed by cardiology Recent lipid panel normal  5.  Gastro-esophageal reflux disease without esophagitis Controlled, continue PPI EGD done yesterday showed a gastric polyp - biopsy pending and a HH.  6. Hyperlipidemia, mixed On statin therapy  7. Tobacco use disorder, moderate, in sustained remission Remains tobacco free  8. Daytime somnolence Likely due to tizanidine - hold meds during the day If somnolence continues, will recommend sleep study Otherwise discuss change in medication with pain specialist   Partially dictated using Dragon software. Any errors are unintentional.  Bari Edward, MD [facility]  09/01/2018

## 2018-09-02 LAB — COMPREHENSIVE METABOLIC PANEL
A/G RATIO: 1.8 (ref 1.2–2.2)
ALT: 16 IU/L (ref 0–32)
AST: 18 IU/L (ref 0–40)
Albumin: 4.6 g/dL (ref 3.6–4.8)
Alkaline Phosphatase: 69 IU/L (ref 39–117)
BILIRUBIN TOTAL: 0.2 mg/dL (ref 0.0–1.2)
BUN/Creatinine Ratio: 21 (ref 12–28)
BUN: 16 mg/dL (ref 8–27)
CALCIUM: 9.3 mg/dL (ref 8.7–10.3)
CHLORIDE: 102 mmol/L (ref 96–106)
CO2: 27 mmol/L (ref 20–29)
Creatinine, Ser: 0.75 mg/dL (ref 0.57–1.00)
GFR, EST AFRICAN AMERICAN: 95 mL/min/{1.73_m2} (ref >59)
GFR, EST NON AFRICAN AMERICAN: 83 mL/min/{1.73_m2} (ref >59)
GLOBULIN, TOTAL: 2.6 g/dL (ref 1.5–4.5)
Glucose: 93 mg/dL (ref 65–99)
POTASSIUM: 4.3 mmol/L (ref 3.5–5.2)
SODIUM: 143 mmol/L (ref 134–144)
Total Protein: 7.2 g/dL (ref 6.0–8.5)

## 2018-09-02 LAB — TSH: TSH: 1.47 u[IU]/mL (ref 0.450–4.500)

## 2018-09-18 ENCOUNTER — Other Ambulatory Visit: Payer: Self-pay | Admitting: Internal Medicine

## 2018-09-18 DIAGNOSIS — K219 Gastro-esophageal reflux disease without esophagitis: Secondary | ICD-10-CM

## 2018-10-22 ENCOUNTER — Ambulatory Visit: Payer: Medicare Other | Admitting: Internal Medicine

## 2018-10-22 ENCOUNTER — Encounter: Payer: Self-pay | Admitting: Internal Medicine

## 2018-10-22 VITALS — BP 124/68 | HR 80 | Temp 98.1°F | Ht 62.0 in | Wt 171.0 lb

## 2018-10-22 DIAGNOSIS — J0100 Acute maxillary sinusitis, unspecified: Secondary | ICD-10-CM | POA: Diagnosis not present

## 2018-10-22 MED ORDER — LEVOFLOXACIN 500 MG PO TABS: 500.0000 mg | ORAL_TABLET | Freq: Every day | ORAL | 0 refills | Status: AC

## 2018-10-22 NOTE — Progress Notes (Signed)
Date:  10/22/2018   Name:  Jordan Baxter   DOB:  24-Jul-1951   MRN:  [id number]   Chief Complaint: Cough (Cough X 1 week. Yellow production. Eyes and nose running. Tried OTC and no relief.)  Sinus Problem  This is a new problem. The current episode started in the past 7 days. The problem has been gradually worsening since onset. There has been no fever. Associated symptoms include coughing, sinus pressure and sneezing. Past treatments include spray decongestants. The treatment provided mild relief.    Review of Systems  HENT: Positive for sinus pressure and sneezing.   Respiratory: Positive for cough.     Patient Active Problem List   Diagnosis Date Noted  . Chest pain 02/16/2018  . BMI 32.0-32.9,adult 05/21/2016  . Tobacco use disorder, moderate, in sustained remission 04/23/2016  . H/O cardiac catheterization 03/20/2015  . Coronary artery disease involving native heart with angina pectoris (HCC) 03/19/2015  . Essential (primary) hypertension 03/19/2015  . Fibrositis 03/19/2015  . Gastro-esophageal reflux disease without esophagitis 03/19/2015  . Hyperlipidemia, mixed 03/19/2015  . H/O arthrodesis 03/19/2015    Allergies  Allergen Reactions  . Cephalosporins   . Ibuprofen Nausea Only  . Penicillins   . Sulfa Antibiotics Photosensitivity    Past Surgical History:  Procedure Laterality Date  . ABDOMINAL HYSTERECTOMY  1986  . CARPAL TUNNEL RELEASE Right   . CERVICAL FUSION  2015  . CESAREAN SECTION  1984  . COLONOSCOPY WITH ESOPHAGOGASTRODUODENOSCOPY (EGD)  08/31/2018   Dr. Earlean PolkaSolik  . KNEE ARTHROSCOPY Right   . PARTIAL HYSTERECTOMY    . ROTATOR CUFF REPAIR Left     Social History   Tobacco Use  . Smoking status: Former Smoker    Packs/day: 0.50    Years: 45.00    Pack years: 22.50    Types: Cigarettes    Last attempt to quit: 03/13/2012    Years since quitting: 6.6  . Smokeless tobacco: Never Used  Substance Use Topics  . Alcohol use: No    Alcohol/week:  0.0 standard drinks  . Drug use: No     Medication list has been reviewed and updated.  Current Meds  Medication Sig  . acetaminophen-codeine (TYLENOL #3) 300-30 MG tablet Take by mouth every 4 (four) hours as needed for moderate pain.  Marland Kitchen. acetaminophen-codeine (TYLENOL #4) 300-60 MG per tablet Take 1-2 tablets by mouth 3 (three) times daily as needed.  Marland Kitchen. aspirin 81 MG tablet Take 1 tablet by mouth daily.  . Calcium-Vitamin D 600-200 MG-UNIT per tablet Take 1 tablet by mouth 2 (two) times daily.  . fluticasone (FLONASE) 50 MCG/ACT nasal spray SHAKE LIQUID AND USE 2 SPRAYS IN EACH NOSTRIL DAILY  . lidocaine (LIDODERM) 5 % Place 1 patch onto the skin daily.  . metoprolol tartrate (LOPRESSOR) 25 MG tablet Take 0.5 tablets by mouth 2 (two) times daily.  . nitroGLYCERIN (NITROSTAT) 0.4 MG SL tablet Place 1 tablet under the tongue daily as needed.  Marland Kitchen. omeprazole (PRILOSEC) 20 MG capsule Take 1 capsule (20 mg total) by mouth 2 (two) times daily before a meal.  . rosuvastatin (CRESTOR) 40 MG tablet TK 1 T PO ONCE D.  . sucralfate (CARAFATE) 1 g tablet TK 1 T PO D  . tiZANidine (ZANAFLEX) 4 MG tablet Take 4 mg by mouth 3 (three) times daily.   Marland Kitchen. triamterene-hydrochlorothiazide (MAXZIDE-25) 37.5-25 MG tablet TAKE 1 TABLET BY MOUTH DAILY  . umeclidinium-vilanterol (ANORO ELLIPTA) 62.5-25 MCG/INH AEPB Inhale 1 puff into  the lungs daily.  Monte Fantasia INHUB 100-50 MCG/DOSE AEPB INL 1 PUFF ITL BID    PHQ 2/9 Scores 08/25/2018 07/30/2017 08/22/2016 08/22/2015  PHQ - 2 Score 0 0 0 0    Physical Exam Constitutional:      Appearance: She is well-developed.  HENT:     Right Ear: Ear canal and external ear normal. Tympanic membrane is not erythematous or retracted.     Left Ear: Ear canal and external ear normal. Tympanic membrane is not erythematous or retracted.     Nose:     Right Sinus: Maxillary sinus tenderness and frontal sinus tenderness present.     Left Sinus: Maxillary sinus tenderness and  frontal sinus tenderness present.     Mouth/Throat:     Mouth: No oral lesions.     Pharynx: Uvula midline. Posterior oropharyngeal erythema present. No oropharyngeal exudate.  Eyes:     Extraocular Movements: Extraocular movements intact.     Pupils: Pupils are equal, round, and reactive to light.  Cardiovascular:     Rate and Rhythm: Normal rate and regular rhythm.     Heart sounds: Normal heart sounds.  Pulmonary:     Breath sounds: Normal breath sounds. No wheezing or rales.  Lymphadenopathy:     Cervical: No cervical adenopathy.  Neurological:     Mental Status: She is alert and oriented to person, place, and time.     BP 124/68   Pulse 80   Temp 98.1 F (36.7 C) (Oral)   Ht 5\' 2"  (1.575 m)   Wt 171 lb (77.6 kg)   SpO2 94%   BMI 31.28 kg/m   Assessment and Plan: 1. Acute non-recurrent maxillary sinusitis Continue Flonase Continue cough syrup at night - levofloxacin (LEVAQUIN) 500 MG tablet; Take 1 tablet (500 mg total) by mouth daily for 5 days.  Dispense: 5 tablet; Refill: 0   Partially dictated using Animal nutritionist. Any errors are unintentional.  Bari Edward, MD [facility]  10/22/2018

## 2018-10-23 ENCOUNTER — Other Ambulatory Visit: Payer: Self-pay | Admitting: Internal Medicine

## 2018-12-22 ENCOUNTER — Other Ambulatory Visit: Payer: Self-pay | Admitting: Internal Medicine

## 2019-02-20 ENCOUNTER — Other Ambulatory Visit: Payer: Self-pay | Admitting: Internal Medicine

## 2019-03-18 ENCOUNTER — Other Ambulatory Visit: Payer: Self-pay | Admitting: Internal Medicine

## 2019-03-18 DIAGNOSIS — R0602 Shortness of breath: Secondary | ICD-10-CM

## 2019-05-21 ENCOUNTER — Other Ambulatory Visit: Payer: Self-pay | Admitting: Internal Medicine

## 2019-06-13 LAB — LIPID PANEL
Cholesterol: 134 (ref 0–200)
HDL: 58 (ref 35–70)
LDL Cholesterol: 52
Triglycerides: 118 (ref 40–160)

## 2019-08-08 ENCOUNTER — Telehealth: Payer: Self-pay

## 2019-08-08 NOTE — Telephone Encounter (Signed)
Called and left VM informing patient of this. Told to call back with any further questions.

## 2019-08-08 NOTE — Telephone Encounter (Signed)
I am not aware of sweats being a common side effect of any of her medication.

## 2019-08-08 NOTE — Telephone Encounter (Signed)
Patient called complaining of excessive sweating since the beginning of this year. It is getting worse. She said no matter what she does, she is sweating and this is unlike her. She has no fever or other symptoms.   Wants to know if any of her medication could be causing this?  Please advise.

## 2019-08-09 ENCOUNTER — Telehealth: Payer: Self-pay | Admitting: Internal Medicine

## 2019-08-09 NOTE — Telephone Encounter (Signed)
Called to schedule Medicare Annual Wellness Visit with Nurse Health Advisor, Clemetine Marker at [facility]. If patient returns call, please schedule AWV with [facility] ~ After 08/25/18 on [facility] schedule (Mon or Wed)  Questions regarding scheduling, please call  [phone] or Skype > [email or web address]   [place]  ??[email or web address]   ??[phone]   1Skype

## 2019-08-09 NOTE — Telephone Encounter (Signed)
Per pt she left a message for dr.B and never got a call back. I did mention you tried to call her yesterday as noted in her chart and that you did leave a VM, per pt she did not get a message and is requesting a call back on home phone.

## 2019-08-09 NOTE — Telephone Encounter (Signed)
Spoke with patient and informed I called and left VM on her cell phone yesterday. Told her it does not look like her medication could be causing this per Army Melia. She was insistent on coming in for an appt to discuss this because she feels something is off. She said She is sweating excessively but not having hot flashes. She said this is different.   Scheduled to discuss with Dr Army Melia tomorrow afternoon.

## 2019-08-10 ENCOUNTER — Other Ambulatory Visit: Payer: Self-pay | Admitting: Internal Medicine

## 2019-08-10 ENCOUNTER — Encounter: Payer: Self-pay | Admitting: Internal Medicine

## 2019-08-10 ENCOUNTER — Other Ambulatory Visit: Payer: Self-pay

## 2019-08-10 ENCOUNTER — Ambulatory Visit: Payer: Medicare Other | Admitting: Internal Medicine

## 2019-08-10 VITALS — BP 118/82 | HR 100 | Ht 62.0 in | Wt 165.0 lb

## 2019-08-10 DIAGNOSIS — R61 Generalized hyperhidrosis: Secondary | ICD-10-CM

## 2019-08-10 DIAGNOSIS — I10 Essential (primary) hypertension: Secondary | ICD-10-CM | POA: Diagnosis not present

## 2019-08-10 DIAGNOSIS — Z23 Encounter for immunization: Secondary | ICD-10-CM | POA: Diagnosis not present

## 2019-08-10 NOTE — Progress Notes (Signed)
Date:  08/10/2019   Name:  Jordan Baxter   DOB:  1951-04-15   MRN:  [id number]   Chief Complaint: Excessive Sweating (across forehead and on arms, under bra- has "tags" coming up everywhere. Occurs mostly when eating)  Sweats- pt has noticed onset of sweats since March.  No new medications at that time other than repeat ESI.  She was started on tofranil for her atypical chest pains. She described sweats that start on her forehead, go down her chest and under her breasts and on her arms.  They last about 10 minutes then resolved, occurring about 3 times per day/night.  Hot food or drink seems to trigger them. There are no other symptoms that accompany these.  Review of Systems  Constitutional: Positive for diaphoresis. Negative for chills, fatigue, fever and unexpected weight change.  Eyes: Negative for visual disturbance.  Respiratory: Negative for cough, chest tightness, shortness of breath and wheezing.   Cardiovascular: Negative for chest pain, palpitations and leg swelling.  Gastrointestinal: Negative for abdominal pain, blood in stool, constipation, diarrhea and vomiting.  Musculoskeletal: Positive for back pain.  Skin: Negative for color change and rash.       She has developed a number of skin tags under her breasts  Neurological: Negative for dizziness, tremors, light-headedness and headaches.  Psychiatric/Behavioral: Positive for sleep disturbance.    Patient Active Problem List   Diagnosis Date Noted  . Chest pain 02/16/2018  . BMI 32.0-32.9,adult 05/21/2016  . Tobacco use disorder, moderate, in sustained remission 04/23/2016  . H/O cardiac catheterization 03/20/2015  . Coronary artery disease involving native heart with angina pectoris (HCC) 03/19/2015  . Essential (primary) hypertension 03/19/2015  . Fibrositis 03/19/2015  . Gastro-esophageal reflux disease without esophagitis 03/19/2015  . Hyperlipidemia, mixed 03/19/2015  . H/O arthrodesis 03/19/2015     Allergies  Allergen Reactions  . Cephalosporins   . Ibuprofen Nausea Only  . Penicillins   . Sulfa Antibiotics Photosensitivity    Past Surgical History:  Procedure Laterality Date  . ABDOMINAL HYSTERECTOMY  1986  . CARPAL TUNNEL RELEASE Right   . CERVICAL FUSION  2015  . CESAREAN SECTION  1984  . COLONOSCOPY WITH ESOPHAGOGASTRODUODENOSCOPY (EGD)  08/31/2018   Dr. Earlean Polka  . KNEE ARTHROSCOPY Right   . PARTIAL HYSTERECTOMY    . ROTATOR CUFF REPAIR Left     Social History   Tobacco Use  . Smoking status: Former Smoker    Packs/day: 0.50    Years: 45.00    Pack years: 22.50    Types: Cigarettes    Quit date: 03/13/2012    Years since quitting: 7.4  . Smokeless tobacco: Never Used  Substance Use Topics  . Alcohol use: No    Alcohol/week: 0.0 standard drinks  . Drug use: No     Medication list has been reviewed and updated.  Current Meds  Medication Sig  . acetaminophen-codeine (TYLENOL #4) 300-60 MG per tablet Take 1-2 tablets by mouth 4 (four) times daily as needed.   Ailene Ards ELLIPTA 62.5-25 MCG/INH AEPB INHALE 1 PUFF INTO THE LUNGS DAILY  . aspirin 81 MG tablet Take 1 tablet by mouth daily.  . Calcium-Vitamin D 600-200 MG-UNIT per tablet Take 1 tablet by mouth 2 (two) times daily.  . fluticasone (FLONASE) 50 MCG/ACT nasal spray SHAKE LIQUID AND USE 2 SPRAYS IN EACH NOSTRIL DAILY  . imipramine (TOFRANIL) 25 MG tablet Take 2 tablets by mouth at bedtime.  . lidocaine (LIDODERM) 5 % Place 1  patch onto the skin daily.  . metoprolol tartrate (LOPRESSOR) 25 MG tablet Take 25 mg by mouth 2 (two) times daily.   . nitroGLYCERIN (NITROSTAT) 0.4 MG SL tablet Place 1 tablet under the tongue daily as needed.  Marland Kitchen omeprazole (PRILOSEC) 20 MG capsule TAKE 1 CAPSULE(20 MG) BY MOUTH TWICE DAILY BEFORE A MEAL (Patient taking differently: Take 20 mg by mouth daily. )  . rosuvastatin (CRESTOR) 40 MG tablet TK 1 T PO ONCE D.  . tiZANidine (ZANAFLEX) 4 MG tablet Take 4 mg by mouth 3  (three) times daily.   Marland Kitchen triamterene-hydrochlorothiazide (MAXZIDE-25) 37.5-25 MG tablet TAKE 1 TABLET BY MOUTH DAILY  . WIXELA INHUB 100-50 MCG/DOSE AEPB INHALE 1 PUFF INTO THE LUNGS TWICE DAILY    PHQ 2/9 Scores 08/10/2019 08/25/2018 07/30/2017 08/22/2016  PHQ - 2 Score 2 0 0 0  PHQ- 9 Score 2 - - -    BP Readings from Last 3 Encounters:  08/10/19 118/82  10/22/18 124/68  09/01/18 132/72    Physical Exam Vitals signs and nursing note reviewed.  Constitutional:      General: She is not in acute distress.    Appearance: She is well-developed.  HENT:     Head: Normocephalic and atraumatic.  Neck:     Musculoskeletal: Normal range of motion.     Vascular: No carotid bruit.  Cardiovascular:     Rate and Rhythm: Normal rate and regular rhythm.     Pulses: Normal pulses.     Heart sounds: No murmur.  Pulmonary:     Effort: Pulmonary effort is normal. No respiratory distress.     Breath sounds: No wheezing or rhonchi.  Abdominal:     General: Bowel sounds are normal. There is no distension.     Palpations: Abdomen is soft. There is no mass.     Tenderness: There is no abdominal tenderness.     Hernia: No hernia is present.  Musculoskeletal:     Right lower leg: No edema.     Left lower leg: No edema.  Lymphadenopathy:     Cervical: No cervical adenopathy.  Skin:    General: Skin is warm and dry.     Capillary Refill: Capillary refill takes less than 2 seconds.     Findings: No rash.     Comments: Multiple flat nevi and skin tags under both breasts  Neurological:     Mental Status: She is alert and oriented to person, place, and time.  Psychiatric:        Behavior: Behavior normal.        Thought Content: Thought content normal.     Wt Readings from Last 3 Encounters:  08/10/19 165 lb (74.8 kg)  10/22/18 171 lb (77.6 kg)  09/01/18 169 lb (76.7 kg)    BP 118/82   Pulse 100   Ht 5\' 2"  (1.575 m)   Wt 165 lb (74.8 kg)   SpO2 96%   BMI 30.18 kg/m   Assessment  and Plan: 1. Chronic night sweats Suspect this is delayed menopausal sweats Will rule out other causes with labs Consider trial of low dose estrogen if sx continue and labs are normal - CBC with Differential/Platelet - Comprehensive metabolic panel - QuantiFERON-TB Gold Plus - TSH+T4F+T3Free  2. Essential (primary) hypertension Clinically stable exam with well controlled BP.   Tolerating medications, metoprolol and triamterene, without side effects at this time. Pt to continue current regimen and low sodium diet; benefits of regular exercise as able discussed.  3. Influenza vaccine needed - Flu Vaccine QUAD High Dose(Fluad)   Partially dictated using Editor, commissioning. Any errors are unintentional.  Halina Maidens, MD [facility]  08/10/2019

## 2019-08-13 LAB — CBC WITH DIFFERENTIAL/PLATELET
Basophils Absolute: 0.1 10*3/uL (ref 0.0–0.2)
Basos: 1 %
EOS (ABSOLUTE): 0.2 10*3/uL (ref 0.0–0.4)
Eos: 3 %
Hematocrit: 41 % (ref 34.0–46.6)
Hemoglobin: 14.1 g/dL (ref 11.1–15.9)
Immature Grans (Abs): 0 10*3/uL (ref 0.0–0.1)
Immature Granulocytes: 0 %
Lymphocytes Absolute: 2.3 10*3/uL (ref 0.7–3.1)
Lymphs: 37 %
MCH: 29.5 pg (ref 26.6–33.0)
MCHC: 34.4 g/dL (ref 31.5–35.7)
MCV: 86 fL (ref 79–97)
Monocytes Absolute: 0.4 10*3/uL (ref 0.1–0.9)
Monocytes: 6 %
Neutrophils Absolute: 3.4 10*3/uL (ref 1.4–7.0)
Neutrophils: 53 %
Platelets: 312 10*3/uL (ref 150–450)
RBC: 4.78 x10E6/uL (ref 3.77–5.28)
RDW: 12.4 % (ref 11.7–15.4)
WBC: 6.3 10*3/uL (ref 3.4–10.8)

## 2019-08-13 LAB — COMPREHENSIVE METABOLIC PANEL
ALT: 28 IU/L (ref 0–32)
AST: 21 IU/L (ref 0–40)
Albumin/Globulin Ratio: 1.8 (ref 1.2–2.2)
Albumin: 4.6 g/dL (ref 3.8–4.8)
Alkaline Phosphatase: 67 IU/L (ref 39–117)
BUN/Creatinine Ratio: 21 (ref 12–28)
BUN: 15 mg/dL (ref 8–27)
Bilirubin Total: 0.2 mg/dL (ref 0.0–1.2)
CO2: 28 mmol/L (ref 20–29)
Calcium: 9.7 mg/dL (ref 8.7–10.3)
Chloride: 99 mmol/L (ref 96–106)
Creatinine, Ser: 0.73 mg/dL (ref 0.57–1.00)
GFR calc Af Amer: 98 mL/min/{1.73_m2} (ref >59)
GFR calc non Af Amer: 85 mL/min/{1.73_m2} (ref >59)
Globulin, Total: 2.5 g/dL (ref 1.5–4.5)
Glucose: 137 mg/dL — ABNORMAL HIGH (ref 65–99)
Potassium: 3.5 mmol/L (ref 3.5–5.2)
Sodium: 142 mmol/L (ref 134–144)
Total Protein: 7.1 g/dL (ref 6.0–8.5)

## 2019-08-13 LAB — TSH+T4F+T3FREE
Free T4: 1.29 ng/dL (ref 0.82–1.77)
T3, Free: 3.1 pg/mL (ref 2.0–4.4)
TSH: 1.18 u[IU]/mL (ref 0.450–4.500)

## 2019-08-13 LAB — QUANTIFERON-TB GOLD PLUS
QuantiFERON Mitogen Value: 4.18 IU/mL
QuantiFERON Nil Value: 0.01 IU/mL
QuantiFERON TB1 Ag Value: 0.03 IU/mL
QuantiFERON TB2 Ag Value: 0.02 IU/mL
QuantiFERON-TB Gold Plus: NEGATIVE

## 2019-08-15 ENCOUNTER — Other Ambulatory Visit: Payer: Self-pay | Admitting: Internal Medicine

## 2019-08-15 DIAGNOSIS — N951 Menopausal and female climacteric states: Secondary | ICD-10-CM

## 2019-08-15 MED ORDER — ESTRADIOL 0.5 MG PO TABS: 0.5000 mg | ORAL_TABLET | Freq: Every day | ORAL | 0 refills | Status: DC

## 2019-08-15 NOTE — Progress Notes (Signed)
Pls advise Berglund response.

## 2019-08-16 ENCOUNTER — Other Ambulatory Visit: Payer: Self-pay

## 2019-08-16 DIAGNOSIS — R600 Localized edema: Secondary | ICD-10-CM

## 2019-08-16 MED ORDER — TRIAMTERENE-HCTZ 37.5-25 MG PO TABS: 1.0000 | ORAL_TABLET | Freq: Every day | ORAL | 1 refills | Status: DC

## 2019-08-31 ENCOUNTER — Ambulatory Visit (INDEPENDENT_AMBULATORY_CARE_PROVIDER_SITE_OTHER): Payer: Medicare Other | Admitting: Internal Medicine

## 2019-08-31 ENCOUNTER — Telehealth: Payer: Self-pay

## 2019-08-31 ENCOUNTER — Other Ambulatory Visit: Payer: Self-pay

## 2019-08-31 ENCOUNTER — Encounter: Payer: Self-pay | Admitting: Internal Medicine

## 2019-08-31 ENCOUNTER — Ambulatory Visit (INDEPENDENT_AMBULATORY_CARE_PROVIDER_SITE_OTHER): Payer: Medicare Other

## 2019-08-31 VITALS — Ht 62.0 in | Wt 165.0 lb

## 2019-08-31 VITALS — BP 118/85 | HR 95 | Temp 97.3°F | Ht 62.0 in | Wt 165.0 lb

## 2019-08-31 DIAGNOSIS — J069 Acute upper respiratory infection, unspecified: Secondary | ICD-10-CM | POA: Diagnosis not present

## 2019-08-31 DIAGNOSIS — Z0000 Encounter for general adult medical examination without abnormal findings: Secondary | ICD-10-CM | POA: Diagnosis not present

## 2019-08-31 MED ORDER — AZITHROMYCIN 250 MG PO TABS: ORAL_TABLET | ORAL | 0 refills | Status: AC

## 2019-08-31 NOTE — Patient Instructions (Signed)
Mucinex-DM  flonase nasal Anoro daily

## 2019-08-31 NOTE — Progress Notes (Signed)
Subjective:   Jordan Baxter is a 68 y.o. female who presents for Medicare Annual (Subsequent) preventive examination.  Virtual Visit via Telephone Note  I connected with Jordan Baxter on 08/31/19 at  8:00 AM EST by telephone and verified that I am speaking with the correct person using two identifiers.  Medicare Annual Wellness visit completed telephonically due to Covid-19 pandemic.  Location: Patient: home Provider: office   I discussed the limitations, risks, security and privacy concerns of performing an evaluation and management service by telephone and the availability of in person appointments. The patient expressed understanding and agreed to proceed.  Some vital signs may be absent or patient reported.   Reather LittlerKasey Raheem Kolbe, LPN    Review of Systems:   Cardiac Risk Factors include: advanced age (>4255men, 15>65 women);hypertension;dyslipidemia;obesity (BMI >30kg/m2)     Objective:     Vitals: Ht 5\' 2"  (1.575 m)   Wt 165 lb (74.8 kg)   BMI 30.18 kg/m   Body mass index is 30.18 kg/m.  Advanced Directives 08/31/2019 07/30/2017  Does Patient Have a Medical Advance Directive? Yes Yes  Type of Estate agentAdvance Directive Healthcare Power of [place];Living will Healthcare Power of [place];Living will  Copy of Healthcare Power of Attorney in Chart? No - copy requested No - copy requested    Tobacco Social History   Tobacco Use  Smoking Status Former Smoker  . Packs/day: 0.50  . Years: 45.00  . Pack years: 22.50  . Types: Cigarettes  . Quit date: 03/13/2012  . Years since quitting: 7.4  Smokeless Tobacco Never Used     Counseling given: Not Answered   Clinical Intake:  Pre-visit preparation completed: Yes  Pain : No/denies pain     BMI - recorded: 30.18 Nutritional Status: BMI > 30  Obese Nutritional Risks: Nausea/ vomitting/ diarrhea Diabetes: No  How often do you need to have someone help you when you read instructions, pamphlets, or other written materials from  your doctor or pharmacy?: 1 - Never  Interpreter Needed?: No  Information entered by :: Reather LittlerKasey Deaven Barron LPN  Past Medical History:  Diagnosis Date  . Allergy   . Arthritis   . Fibrositis   . GERD (gastroesophageal reflux disease)   . Hyperlipidemia   . Hypertension    Past Surgical History:  Procedure Laterality Date  . ABDOMINAL HYSTERECTOMY  1986  . CARPAL TUNNEL RELEASE Right   . CERVICAL FUSION  2015  . CESAREAN SECTION  1984  . COLONOSCOPY WITH ESOPHAGOGASTRODUODENOSCOPY (EGD)  08/31/2018   Dr. Earlean PolkaSolik  . KNEE ARTHROSCOPY Right   . PARTIAL HYSTERECTOMY    . ROTATOR CUFF REPAIR Left    Family History  Problem Relation Age of Onset  . CAD Mother   . Heart disease Mother   . Cancer Father   . Heart disease Sister   . Heart attack Sister   . Heart disease Brother   . Heart attack Sister   . Arthritis Paternal Grandmother    Social History   Socioeconomic History  . Marital status: Divorced    Spouse name: Not on file  . Number of children: 1  . Years of education: some college  . Highest education level: Not on file  Occupational History  . Occupation: retired  Engineer, productionocial Needs  . Financial resource strain: Not hard at all  . Food insecurity    Worry: Never true    Inability: Never true  . Transportation needs    Medical: No    Non-medical: No  Tobacco Use  . Smoking status: Former Smoker    Packs/day: 0.50    Years: 45.00    Pack years: 22.50    Types: Cigarettes    Quit date: 03/13/2012    Years since quitting: 7.4  . Smokeless tobacco: Never Used  Substance and Sexual Activity  . Alcohol use: No    Alcohol/week: 0.0 standard drinks  . Drug use: No  . Sexual activity: Never  Lifestyle  . Physical activity    Days per week: 2 days    Minutes per session: 30 min  . Stress: Only a little  Relationships  . Social connections    Talks on phone: More than three times a week    Gets together: More than three times a week    Attends religious service:  More than 4 times per year    Active member of club or organization: Yes    Attends meetings of clubs or organizations: More than 4 times per year    Relationship status: Divorced  Other Topics Concern  . Not on file  Social History Narrative  . Not on file    Outpatient Encounter Medications as of 08/31/2019  Medication Sig  . acetaminophen-codeine (TYLENOL #4) 300-60 MG per tablet Take 1-2 tablets by mouth 4 (four) times daily as needed.   Jearl Klinefelter ELLIPTA 62.5-25 MCG/INH AEPB INHALE 1 PUFF INTO THE LUNGS DAILY  . aspirin 81 MG tablet Take 1 tablet by mouth daily.  . Calcium-Vitamin D 600-200 MG-UNIT per tablet Take 1 tablet by mouth 2 (two) times daily.  Marland Kitchen estradiol (ESTRACE) 0.5 MG tablet Take 1 tablet (0.5 mg total) by mouth daily.  . fluticasone (FLONASE) 50 MCG/ACT nasal spray SHAKE LIQUID AND USE 2 SPRAYS IN EACH NOSTRIL DAILY  . imipramine (TOFRANIL) 25 MG tablet Take 2 tablets by mouth at bedtime.  . lidocaine (LIDODERM) 5 % Place 1 patch onto the skin daily.  . metoprolol tartrate (LOPRESSOR) 25 MG tablet Take 25 mg by mouth 2 (two) times daily.   . nitroGLYCERIN (NITROSTAT) 0.4 MG SL tablet Place 1 tablet under the tongue daily as needed.  Marland Kitchen omeprazole (PRILOSEC) 20 MG capsule TAKE 1 CAPSULE(20 MG) BY MOUTH TWICE DAILY BEFORE A MEAL (Patient taking differently: Take 20 mg by mouth daily. )  . rosuvastatin (CRESTOR) 40 MG tablet TK 1 T PO ONCE D.  . tiZANidine (ZANAFLEX) 4 MG tablet Take 4 mg by mouth 3 (three) times daily.   Marland Kitchen triamterene-hydrochlorothiazide (MAXZIDE-25) 37.5-25 MG tablet Take 1 tablet by mouth daily.  Grant Ruts INHUB 100-50 MCG/DOSE AEPB INHALE 1 PUFF INTO THE LUNGS TWICE DAILY   No facility-administered encounter medications on file as of 08/31/2019.     Activities of Daily Living In your present state of health, do you have any difficulty performing the following activities: 08/31/2019  Hearing? N  Comment declines hearing aids  Vision? N  Difficulty  concentrating or making decisions? N  Walking or climbing stairs? N  Dressing or bathing? N  Doing errands, shopping? N  Preparing Food and eating ? N  Using the Toilet? N  In the past six months, have you accidently leaked urine? N  Do you have problems with loss of bowel control? N  Managing your Medications? N  Managing your Finances? N  Housekeeping or managing your Housekeeping? N  Some recent data might be hidden    Patient Care Team: Glean Hess, MD as PCP - General (Internal Medicine) Luana Shu, MD  as Referring Physician (Physical Medicine and Rehabilitation) Laretta Bolster, MD as Referring Physician (Cardiology) Lacey Jensen, MD as Consulting Physician (Gastroenterology)    Assessment:   This is a routine wellness examination for [place].  Exercise Activities and Dietary recommendations Current Exercise Habits: Home exercise routine, Type of exercise: Other - see comments(elliptical), Time (Minutes): 30, Frequency (Times/Week): 2, Weekly Exercise (Minutes/Week): 60, Exercise limited by: orthopedic condition(s)  Goals    . Increase physical activity     Recommend increasing physical activity 3 days per week for 30 minutes.     . Reduce sugar intake to X grams per day     Recommend to eliminate sweets from your diet       Fall Risk Fall Risk  08/31/2019 08/25/2018 07/30/2017 08/22/2016 08/22/2015  Falls in the past year? 0 1 Yes No No  Number falls in past yr: 0 1 1 - -  Injury with Fall? 0 0 No - -  Risk for fall due to : - - Medication side effect - -  Follow up Falls prevention discussed - Education provided;Falls prevention discussed - -   FALL RISK PREVENTION PERTAINING TO THE HOME:  Any stairs in or around the home? Yes  If so, do they handrails? Yes   Home free of loose throw rugs in walkways, pet beds, electrical cords, etc? Yes  Adequate lighting in your home to reduce risk of falls? Yes   ASSISTIVE DEVICES UTILIZED TO PREVENT FALLS:   Life alert? No  Use of a cane, walker or w/c? No  Grab bars in the bathroom? Yes Shower chair or bench in shower? No  Elevated toilet seat or a handicapped toilet? Yes   DME ORDERS:  DME order needed?  No   TIMED UP AND GO:  Was the test performed? No . Telephonic visit.   Education: Fall risk prevention has been discussed.  Intervention(s) required? No   Depression Screen PHQ 2/9 Scores 08/31/2019 08/10/2019 08/25/2018 07/30/2017  PHQ - 2 Score 1 2 0 0  PHQ- 9 Score 4 2 - -     Cognitive Function     6CIT Screen 08/31/2019 08/25/2018 07/30/2017 08/22/2016  What Year? 0 points 0 points 0 points 0 points  What month? 0 points 0 points 0 points 0 points  What time? 0 points 0 points 0 points 0 points  Count back from 20 0 points 0 points 0 points 0 points  Months in reverse 0 points 0 points 0 points 0 points  Repeat phrase 2 points 0 points 0 points 0 points  Total Score 2 0 0 0    Immunization History  Administered Date(s) Administered  . Fluad Quad(high Dose 65+) 08/10/2019  . Influenza, High Dose Seasonal PF 08/25/2018  . Influenza,inj,Quad PF,6+ Mos 07/30/2017  . Influenza-Unspecified 07/22/2015  . Pneumococcal Conjugate-13 08/22/2016  . Pneumococcal Polysaccharide-23 11/13/2009, 07/30/2017  . Tdap 05/03/2014  . Zoster 10/15/2011  . Zoster Recombinat (Shingrix) 11/03/2017, 01/01/2018    Qualifies for Shingles Vaccine? Yes  Shingrix series completed.   Tdap: Up to date  Flu Vaccine: Up to date  Pneumococcal Vaccine: Up to date   Screening Tests Health Maintenance  Topic Date Due  . MAMMOGRAM  03/07/2020  . COLONOSCOPY  10/14/2020  . TETANUS/TDAP  05/03/2024  . INFLUENZA VACCINE  Completed  . DEXA SCAN  Completed  . Hepatitis C Screening  Completed  . PNA vac Low Risk Adult  Completed    Cancer Screenings:   Colorectal Screening:  Completed 2012. Repeat every 10 years;   Mammogram: Completed 03/08/19. Repeat every year;   Bone Density:  Completed 07/01/16. Results reflect OSTEOPOROSIS. Repeat every 2 years.  Lung Cancer Screening: (Low Dose CT Chest recommended if Age 57-80 years, 30 pack-year currently smoking OR have quit w/in 15years.) does not qualify.   Additional Screening:  Hepatitis C Screening: does qualify; Completed 02/19/17  Vision Screening: Recommended annual ophthalmology exams for early detection of glaucoma and other disorders of the eye. Is the patient up to date with their annual eye exam?  No  Who is the provider or what is the name of the office in which the pt attends annual eye exams? [facility]  Dental Screening: Recommended annual dental exams for proper oral hygiene  Community Resource Referral:  CRR required this visit?  No       Plan:    I have personally reviewed and addressed the Medicare Annual Wellness questionnaire and have noted the following in the patient's chart:  A. Medical and social history B. Use of alcohol, tobacco or illicit drugs  C. Current medications and supplements D. Functional ability and status E.  Nutritional status F.  Physical activity G. Advance directives H. List of other physicians I.  Hospitalizations, surgeries, and ER visits in previous 12 months J.  Vitals K. Screenings such as hearing and vision if needed, cognitive and depression L. Referrals and appointments   In addition, I have reviewed and discussed with patient certain preventive protocols, quality metrics, and best practice recommendations. A written personalized care plan for preventive services as well as general preventive health recommendations were provided to patient.   Signed,  Reather Littler, LPN Nurse Health Advisor   Nurse Notes: pt c/o cough, sore throat, congestion, muscle aches, fever and dizziness for the past week. She had planned to call today to talk to Dr. [name]. She denies SOB or loss of taste or smells but states nothing tastes good and she doesn't have an  appetite for anything due to nausea. Advised pt I would notify Dr. [name] for possible virtual visit to determine if Covid testing would be necessary. Pt aware and appreciative.

## 2019-08-31 NOTE — Telephone Encounter (Signed)
Please call pt and have her schedule her for a VV at 11AM. She has fever, congestion, cough, body aches.   Please tell her to check her tempeture and Blood Pressure if able before I call her around 1030.  Thank you.

## 2019-08-31 NOTE — Progress Notes (Signed)
Date:  08/31/2019   Name:  Jordan Baxter   DOB:  [birth year]   MRN:  [id number]  I connected with this patient, Jordan Baxter, by telephone at the patient's home.  I verified that I am speaking with the correct person using two identifiers. This visit was conducted via telephone due to the Covid-19 outbreak from my office at [facility] in [place], [place]. I discussed the limitations, risks, security and privacy concerns of performing an evaluation and management service by telephone. I also discussed with the patient that there may be a patient responsible charge related to this service. The patient expressed understanding and agreed to proceed.  Chief Complaint: Generalized Body Aches (Fever, congestion, cough, and body aches X 1week. Fever was low grade last thursday 99.9 but would break at night. Dry cough- almost gone now. No SOB, and no change of small/taste. Everything makes her nauseated. Started out in throat and moved into chest. )  Cough This is a new problem. The current episode started in the past 7 days. The cough is non-productive (loose sounding). Associated symptoms include a fever (low grade) and a sore throat. Pertinent negatives include no chest pain, ear pain, headaches, nasal congestion, postnasal drip, shortness of breath or wheezing. Treatments tried: flonase.    Lab Results  Component Value Date   CREATININE 0.73 08/10/2019   BUN 15 08/10/2019   NA 142 08/10/2019   K 3.5 08/10/2019   CL 99 08/10/2019   CO2 28 08/10/2019   Lab Results  Component Value Date   CHOL 159 01/19/2018   HDL 49 01/19/2018   LDLCALC 94 01/19/2018   TRIG 151 01/19/2018   CHOLHDL 3.0 08/31/2017   Lab Results  Component Value Date   TSH 1.180 08/10/2019   No results found for: HGBA1C   Review of Systems  Constitutional: Positive for fever (low grade).  HENT: Positive for sore throat. Negative for ear pain and postnasal drip.   Respiratory: Positive for cough. Negative for  shortness of breath and wheezing.   Cardiovascular: Negative for chest pain.  Gastrointestinal: Positive for nausea. Negative for diarrhea and vomiting.  Neurological: Negative for dizziness, seizures, weakness, light-headedness and headaches.    Patient Active Problem List   Diagnosis Date Noted  . Chest pain 02/16/2018  . BMI 32.0-32.9,adult 05/21/2016  . Tobacco use disorder, moderate, in sustained remission 04/23/2016  . H/O cardiac catheterization 03/20/2015  . Coronary artery disease involving native heart with angina pectoris ([place]) 03/19/2015  . Essential (primary) hypertension 03/19/2015  . Fibrositis 03/19/2015  . Gastro-esophageal reflux disease without esophagitis 03/19/2015  . Hyperlipidemia, mixed 03/19/2015  . H/O arthrodesis 03/19/2015    Allergies  Allergen Reactions  . Cephalosporins   . Ibuprofen Nausea Only  . Penicillins   . Sulfa Antibiotics Photosensitivity    Past Surgical History:  Procedure Laterality Date  . ABDOMINAL HYSTERECTOMY  1986  . CARPAL TUNNEL RELEASE Right   . CERVICAL FUSION  2015  . CESAREAN SECTION  1984  . COLONOSCOPY WITH ESOPHAGOGASTRODUODENOSCOPY (EGD)  08/31/2018   Dr. Epimenio Foot  . KNEE ARTHROSCOPY Right   . PARTIAL HYSTERECTOMY    . ROTATOR CUFF REPAIR Left     Social History   Tobacco Use  . Smoking status: Former Smoker    Packs/day: 0.50    Years: 45.00    Pack years: 22.50    Types: Cigarettes    Quit date: 03/13/2012    Years since quitting: 7.4  . Smokeless tobacco:  Never Used  Substance Use Topics  . Alcohol use: No    Alcohol/week: 0.0 standard drinks  . Drug use: No     Medication list has been reviewed and updated.  Current Meds  Medication Sig  . acetaminophen-codeine (TYLENOL #4) 300-60 MG per tablet Take 1-2 tablets by mouth 4 (four) times daily as needed.   Ailene Ards ELLIPTA 62.5-25 MCG/INH AEPB INHALE 1 PUFF INTO THE LUNGS DAILY  . aspirin 81 MG tablet Take 1 tablet by mouth daily.  .  Calcium-Vitamin D 600-200 MG-UNIT per tablet Take 1 tablet by mouth 2 (two) times daily.  Marland Kitchen estradiol (ESTRACE) 0.5 MG tablet Take 1 tablet (0.5 mg total) by mouth daily.  . fluticasone (FLONASE) 50 MCG/ACT nasal spray SHAKE LIQUID AND USE 2 SPRAYS IN EACH NOSTRIL DAILY  . imipramine (TOFRANIL) 25 MG tablet Take 2 tablets by mouth at bedtime.  . lidocaine (LIDODERM) 5 % Place 1 patch onto the skin daily.  . metoprolol tartrate (LOPRESSOR) 25 MG tablet Take 25 mg by mouth 2 (two) times daily.   . nitroGLYCERIN (NITROSTAT) 0.4 MG SL tablet Place 1 tablet under the tongue daily as needed.  Marland Kitchen omeprazole (PRILOSEC) 20 MG capsule TAKE 1 CAPSULE(20 MG) BY MOUTH TWICE DAILY BEFORE A MEAL (Patient taking differently: Take 20 mg by mouth daily. )  . rosuvastatin (CRESTOR) 40 MG tablet TK 1 T PO ONCE D.  . tiZANidine (ZANAFLEX) 4 MG tablet Take 4 mg by mouth 3 (three) times daily.   Marland Kitchen triamterene-hydrochlorothiazide (MAXZIDE-25) 37.5-25 MG tablet Take 1 tablet by mouth daily.  Monte Fantasia INHUB 100-50 MCG/DOSE AEPB INHALE 1 PUFF INTO THE LUNGS TWICE DAILY    PHQ 2/9 Scores 08/31/2019 08/31/2019 08/10/2019 08/25/2018  PHQ - 2 Score 0 1 2 0  PHQ- 9 Score 0 4 2 -    BP Readings from Last 3 Encounters:  08/31/19 118/85  08/10/19 118/82  10/22/18 124/68    Physical Exam Pulmonary:     Comments: Loose cough during the call, no wheezing or dyspnea appreciated Neurological:     Mental Status: She is alert.  Psychiatric:        Attention and Perception: Attention normal.        Mood and Affect: Mood normal.        Speech: Speech normal.        Cognition and Memory: Cognition normal.     Wt Readings from Last 3 Encounters:  08/31/19 165 lb (74.8 kg)  08/31/19 165 lb (74.8 kg)  08/10/19 165 lb (74.8 kg)    BP 118/85   Pulse 95   Temp (!) 97.3 F (36.3 C) (Oral)   Ht 5\' 2"  (1.575 m)   Wt 165 lb (74.8 kg)   BMI 30.18 kg/m   Assessment and Plan: 1. Viral URI In an ex-smoker Will treat for  bacterial infection Begin Mucinex-DM daily Continue Flonase Increase fluids Use Anoro every day - azithromycin (ZITHROMAX Z-PAK) 250 MG tablet; UAD  Dispense: 6 each; Refill: 0  I spent 10 minutes on this encounter. Partially dictated using . Any errors are unintentional.  Animal nutritionist, MD [facility]  08/31/2019

## 2019-08-31 NOTE — Patient Instructions (Signed)
Jordan Baxter , Thank you for taking time to come for your Medicare Wellness Visit. I appreciate your ongoing commitment to your health goals. Please review the following plan we discussed and let me know if I can assist you in the future.   Screening recommendations/referrals: Colonoscopy: done 2012. Repeat in 2022. Mammogram: done 03/08/19 Bone Density: done 07/01/16 Recommended yearly ophthalmology/optometry visit for glaucoma screening and checkup Recommended yearly dental visit for hygiene and checkup  Vaccinations: Influenza vaccine: done 08/10/19 Pneumococcal vaccine: done 07/30/17 Tdap vaccine: done 05/03/14 Shingles vaccine: Shingrix series completed 01/01/18    Advanced directives: Please bring a copy of your health care power of attorney and living will to the office at your convenience.  Conditions/risks identified: Recommend increasing physical activity to 150 minutes per week  Next appointment: Please follow up in one year for your Medicare Annual Wellness visit.     Preventive Care 1 Years and Older, Female Preventive care refers to lifestyle choices and visits with your health care provider that can promote health and wellness. What does preventive care include?  A yearly physical exam. This is also called an annual well check.  Dental exams once or twice a year.  Routine eye exams. Ask your health care provider how often you should have your eyes checked.  Personal lifestyle choices, including:  Daily care of your teeth and gums.  Regular physical activity.  Eating a healthy diet.  Avoiding tobacco and drug use.  Limiting alcohol use.  Practicing safe sex.  Taking low-dose aspirin every day.  Taking vitamin and mineral supplements as recommended by your health care provider. What happens during an annual well check? The services and screenings done by your health care provider during your annual well check will depend on your age, overall health,  lifestyle risk factors, and family history of disease. Counseling  Your health care provider may ask you questions about your:  Alcohol use.  Tobacco use.  Drug use.  Emotional well-being.  Home and relationship well-being.  Sexual activity.  Eating habits.  History of falls.  Memory and ability to understand (cognition).  Work and work Statistician.  Reproductive health. Screening  You may have the following tests or measurements:  Height, weight, and BMI.  Blood pressure.  Lipid and cholesterol levels. These may be checked every 5 years, or more frequently if you are over 66 years old.  Skin check.  Lung cancer screening. You may have this screening every year starting at age 22 if you have a 30-pack-year history of smoking and currently smoke or have quit within the past 15 years.  Fecal occult blood test (FOBT) of the stool. You may have this test every year starting at age 32.  Flexible sigmoidoscopy or colonoscopy. You may have a sigmoidoscopy every 5 years or a colonoscopy every 10 years starting at age 24.  Hepatitis C blood test.  Hepatitis B blood test.  Sexually transmitted disease (STD) testing.  Diabetes screening. This is done by checking your blood sugar (glucose) after you have not eaten for a while (fasting). You may have this done every 1-3 years.  Bone density scan. This is done to screen for osteoporosis. You may have this done starting at age 106.  Mammogram. This may be done every 1-2 years. Talk to your health care provider about how often you should have regular mammograms. Talk with your health care provider about your test results, treatment options, and if necessary, the need for more tests. Vaccines  Your  health care provider may recommend certain vaccines, such as:  Influenza vaccine. This is recommended every year.  Tetanus, diphtheria, and acellular pertussis (Tdap, Td) vaccine. You may need a Td booster every 10 years.  Zoster  vaccine. You may need this after age 62.  Pneumococcal 13-valent conjugate (PCV13) vaccine. One dose is recommended after age 56.  Pneumococcal polysaccharide (PPSV23) vaccine. One dose is recommended after age 68. Talk to your health care provider about which screenings and vaccines you need and how often you need them. This information is not intended to replace advice given to you by your health care provider. Make sure you discuss any questions you have with your health care provider. Document Released: 10/26/2015 Document Revised: 06/18/2016 Document Reviewed: 07/31/2015 Elsevier Interactive Patient Education  2017 [place] Prevention in the Home Falls can cause injuries. They can happen to people of all ages. There are many things you can do to make your home safe and to help prevent falls. What can I do on the outside of my home?  Regularly fix the edges of walkways and driveways and fix any cracks.  Remove anything that might make you trip as you walk through a door, such as a raised step or threshold.  Trim any bushes or trees on the path to your home.  Use bright outdoor lighting.  Clear any walking paths of anything that might make someone trip, such as rocks or tools.  Regularly check to see if handrails are loose or broken. Make sure that both sides of any steps have handrails.  Any raised decks and porches should have guardrails on the edges.  Have any leaves, snow, or ice cleared regularly.  Use sand or salt on walking paths during winter.  Clean up any spills in your garage right away. This includes oil or grease spills. What can I do in the bathroom?  Use night lights.  Install grab bars by the toilet and in the tub and shower. Do not use towel bars as grab bars.  Use non-skid mats or decals in the tub or shower.  If you need to sit down in the shower, use a plastic, non-slip stool.  Keep the floor dry. Clean up any water that spills on the  floor as soon as it happens.  Remove soap buildup in the tub or shower regularly.  Attach bath mats securely with double-sided non-slip rug tape.  Do not have throw rugs and other things on the floor that can make you trip. What can I do in the bedroom?  Use night lights.  Make sure that you have a light by your bed that is easy to reach.  Do not use any sheets or blankets that are too big for your bed. They should not hang down onto the floor.  Have a firm chair that has side arms. You can use this for support while you get dressed.  Do not have throw rugs and other things on the floor that can make you trip. What can I do in the kitchen?  Clean up any spills right away.  Avoid walking on wet floors.  Keep items that you use a lot in easy-to-reach places.  If you need to reach something above you, use a strong step stool that has a grab bar.  Keep electrical cords out of the way.  Do not use floor polish or wax that makes floors slippery. If you must use wax, use non-skid floor wax.  Do not  have throw rugs and other things on the floor that can make you trip. What can I do with my stairs?  Do not leave any items on the stairs.  Make sure that there are handrails on both sides of the stairs and use them. Fix handrails that are broken or loose. Make sure that handrails are as long as the stairways.  Check any carpeting to make sure that it is firmly attached to the stairs. Fix any carpet that is loose or worn.  Avoid having throw rugs at the top or bottom of the stairs. If you do have throw rugs, attach them to the floor with carpet tape.  Make sure that you have a light switch at the top of the stairs and the bottom of the stairs. If you do not have them, ask someone to add them for you. What else can I do to help prevent falls?  Wear shoes that:  Do not have high heels.  Have rubber bottoms.  Are comfortable and fit you well.  Are closed at the toe. Do not wear  sandals.  If you use a stepladder:  Make sure that it is fully opened. Do not climb a closed stepladder.  Make sure that both sides of the stepladder are locked into place.  Ask someone to hold it for you, if possible.  Clearly mark and make sure that you can see:  Any grab bars or handrails.  First and last steps.  Where the edge of each step is.  Use tools that help you move around (mobility aids) if they are needed. These include:  Canes.  Walkers.  Scooters.  Crutches.  Turn on the lights when you go into a dark area. Replace any light bulbs as soon as they burn out.  Set up your furniture so you have a clear path. Avoid moving your furniture around.  If any of your floors are uneven, fix them.  If there are any pets around you, be aware of where they are.  Review your medicines with your doctor. Some medicines can make you feel dizzy. This can increase your chance of falling. Ask your doctor what other things that you can do to help prevent falls. This information is not intended to replace advice given to you by your health care provider. Make sure you discuss any questions you have with your health care provider. Document Released: 07/26/2009 Document Revised: 03/06/2016 Document Reviewed: 11/03/2014 Elsevier Interactive Patient Education  2017 [employer].

## 2019-09-05 ENCOUNTER — Encounter: Payer: Medicare Other | Admitting: Internal Medicine

## 2019-11-02 ENCOUNTER — Other Ambulatory Visit: Payer: Self-pay

## 2019-11-02 ENCOUNTER — Ambulatory Visit (INDEPENDENT_AMBULATORY_CARE_PROVIDER_SITE_OTHER): Payer: Medicare PPO | Admitting: Internal Medicine

## 2019-11-02 ENCOUNTER — Encounter: Payer: Self-pay | Admitting: Internal Medicine

## 2019-11-02 VITALS — BP 112/70 | HR 74 | Temp 98.1°F | Ht 62.0 in | Wt 164.0 lb

## 2019-11-02 DIAGNOSIS — E782 Mixed hyperlipidemia: Secondary | ICD-10-CM | POA: Diagnosis not present

## 2019-11-02 DIAGNOSIS — K219 Gastro-esophageal reflux disease without esophagitis: Secondary | ICD-10-CM

## 2019-11-02 DIAGNOSIS — I10 Essential (primary) hypertension: Secondary | ICD-10-CM | POA: Diagnosis not present

## 2019-11-02 DIAGNOSIS — Z1231 Encounter for screening mammogram for malignant neoplasm of breast: Secondary | ICD-10-CM | POA: Diagnosis not present

## 2019-11-02 DIAGNOSIS — I25119 Atherosclerotic heart disease of native coronary artery with unspecified angina pectoris: Secondary | ICD-10-CM | POA: Diagnosis not present

## 2019-11-02 DIAGNOSIS — Z0000 Encounter for general adult medical examination without abnormal findings: Secondary | ICD-10-CM

## 2019-11-02 LAB — POCT URINALYSIS DIPSTICK
Bilirubin, UA: NEGATIVE
Blood, UA: NEGATIVE
Glucose, UA: NEGATIVE
Ketones, UA: NEGATIVE
Leukocytes, UA: NEGATIVE
Nitrite, UA: NEGATIVE
Protein, UA: NEGATIVE
Spec Grav, UA: 1.02 (ref 1.010–1.025)
Urobilinogen, UA: 0.2 E.U./dL
pH, UA: 6.5 (ref 5.0–8.0)

## 2019-11-02 NOTE — Progress Notes (Signed)
Date:  11/02/2019   Name:  Jordan Baxter   DOB:  04-04-1951   MRN:  [id number]   Chief Complaint: Annual Exam (Breast Exam. No pap- aged out.) Jordan Baxter is a 68 y.o. female who presents today for her Complete Annual Exam. She feels fairly well. 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 She reports she is sleeping well. She denies breast issues.  Mammogram  02/2019 Pap discontinued DEXA  06/2016 Colonoscopy  10/2010 Immunization History  Administered Date(s) Administered  . Fluad Quad(high Dose 65+) 08/10/2019  . Influenza, High Dose Seasonal PF 08/25/2018  . Influenza,inj,Quad PF,6+ Mos 07/30/2017  . Influenza-Unspecified 07/22/2015  . Pneumococcal Conjugate-13 08/22/2016  . Pneumococcal Polysaccharide-23 11/13/2009, 07/30/2017  . Tdap 05/03/2014  . Zoster 10/15/2011  . Zoster Recombinat (Shingrix) 11/03/2017, 01/01/2018    Hypertension This is a chronic problem. The problem is controlled. Pertinent negatives include no chest pain, headaches, palpitations or shortness of breath. Past treatments include diuretics and beta blockers. The current treatment provides significant improvement. Hypertensive end-organ damage includes CAD/MI.  Gastroesophageal Reflux She complains of heartburn. She reports no abdominal pain, no chest pain, no coughing or no wheezing. This is a recurrent problem. Pertinent negatives include no fatigue. She has tried a PPI for the symptoms.  Hyperlipidemia This is a chronic problem. The problem is controlled. Associated symptoms include myalgias. Pertinent negatives include no chest pain or shortness of breath. Current antihyperlipidemic treatment includes statins. The current treatment provides significant improvement of lipids.    Lab Results  Component Value Date   CREATININE 0.73 08/10/2019   BUN 15 08/10/2019   NA 142 08/10/2019   K 3.5 08/10/2019   CL 99 08/10/2019   CO2 28 08/10/2019   Lab Results  Component Value  Date   CHOL 134 06/13/2019   HDL 58 06/13/2019   LDLCALC 52 06/13/2019   TRIG 118 06/13/2019   CHOLHDL 3.0 08/31/2017   Lab Results  Component Value Date   TSH 1.180 08/10/2019   No results found for: HGBA1C   Review of Systems  Constitutional: Negative for chills, fatigue and fever.  HENT: Negative for congestion, hearing loss, tinnitus, trouble swallowing and voice change.   Eyes: Negative for visual disturbance.  Respiratory: Negative for cough, chest tightness, shortness of breath and wheezing.   Cardiovascular: Negative for chest pain, palpitations and leg swelling.  Gastrointestinal: Positive for heartburn. Negative for abdominal distention, abdominal pain, blood in stool, constipation, diarrhea and vomiting.  Endocrine: Negative for polydipsia and polyuria.  Genitourinary: Negative for dysuria, frequency, genital sores, vaginal bleeding and vaginal discharge.  Musculoskeletal: Positive for back pain and myalgias. Negative for arthralgias, gait problem and joint swelling.  Skin: Negative for color change and rash.  Neurological: Negative for dizziness, tremors, light-headedness and headaches.  Hematological: Negative for adenopathy. Does not bruise/bleed easily.  Psychiatric/Behavioral: Negative for dysphoric mood and sleep disturbance. The patient is not nervous/anxious.     Patient Active Problem List   Diagnosis Date Noted  . BMI 32.0-32.9,adult 05/21/2016  . Tobacco use disorder, moderate, in sustained remission 04/23/2016  . H/O cardiac catheterization 03/20/2015  . Coronary artery disease involving native heart with angina pectoris (HCC) 03/19/2015  . Essential (primary) hypertension 03/19/2015  . Fibrositis 03/19/2015  . Gastro-esophageal reflux disease without esophagitis 03/19/2015  . Hyperlipidemia, mixed 03/19/2015  . H/O arthrodesis 03/19/2015    Allergies  Allergen Reactions  . Cephalosporins   . Ibuprofen Nausea Only  . Penicillins   .  Sulfa  Antibiotics Photosensitivity    Past Surgical History:  Procedure Laterality Date  . ABDOMINAL HYSTERECTOMY  1986  . CARPAL TUNNEL RELEASE Right   . CERVICAL FUSION  2015  . CESAREAN SECTION  1984  . COLONOSCOPY WITH ESOPHAGOGASTRODUODENOSCOPY (EGD)  08/31/2018   Dr. Epimenio Foot  . KNEE ARTHROSCOPY Right   . PARTIAL HYSTERECTOMY    . ROTATOR CUFF REPAIR Left     Social History   Tobacco Use  . Smoking status: Former Smoker    Packs/day: 0.50    Years: 45.00    Pack years: 22.50    Types: Cigarettes    Quit date: 03/13/2012    Years since quitting: 7.6  . Smokeless tobacco: Never Used  Substance Use Topics  . Alcohol use: No    Alcohol/week: 0.0 standard drinks  . Drug use: No     Medication list has been reviewed and updated.  Current Meds  Medication Sig  . acetaminophen-codeine (TYLENOL #4) 300-60 MG per tablet Take 1-2 tablets by mouth 4 (four) times daily as needed.   Jearl Klinefelter ELLIPTA 62.5-25 MCG/INH AEPB INHALE 1 PUFF INTO THE LUNGS DAILY  . aspirin 81 MG tablet Take 1 tablet by mouth daily.  . Calcium-Vitamin D 600-200 MG-UNIT per tablet Take 1 tablet by mouth 2 (two) times daily.  . fluticasone (FLONASE) 50 MCG/ACT nasal spray SHAKE LIQUID AND USE 2 SPRAYS IN EACH NOSTRIL DAILY  . imipramine (TOFRANIL) 25 MG tablet Take 2 tablets by mouth at bedtime.  . lidocaine (LIDODERM) 5 % Place 1 patch onto the skin daily.  . metoprolol tartrate (LOPRESSOR) 25 MG tablet Take 25 mg by mouth 2 (two) times daily.   . nitroGLYCERIN (NITROSTAT) 0.4 MG SL tablet Place 1 tablet under the tongue daily as needed.  Marland Kitchen omeprazole (PRILOSEC) 20 MG capsule TAKE 1 CAPSULE(20 MG) BY MOUTH TWICE DAILY BEFORE A MEAL (Patient taking differently: Take 20 mg by mouth daily. )  . rosuvastatin (CRESTOR) 40 MG tablet TK 1 T PO ONCE D.  . tiZANidine (ZANAFLEX) 4 MG tablet Take 4 mg by mouth 3 (three) times daily.   Marland Kitchen triamterene-hydrochlorothiazide (MAXZIDE-25) 37.5-25 MG tablet Take 1 tablet by mouth  daily.  Grant Ruts INHUB 100-50 MCG/DOSE AEPB INHALE 1 PUFF INTO THE LUNGS TWICE DAILY    PHQ 2/9 Scores 11/02/2019 08/31/2019 08/31/2019 08/10/2019  PHQ - 2 Score 0 0 1 2  PHQ- 9 Score 0 0 4 2    BP Readings from Last 3 Encounters:  11/02/19 112/70  08/31/19 118/85  08/10/19 118/82    Physical Exam Vitals and nursing note reviewed.  Constitutional:      General: She is not in acute distress.    Appearance: She is well-developed.  HENT:     Head: Normocephalic and atraumatic.     Right Ear: Tympanic membrane and ear canal normal.     Left Ear: Tympanic membrane and ear canal normal.     Nose:     Right Sinus: No maxillary sinus tenderness.     Left Sinus: No maxillary sinus tenderness.  Eyes:     General: No scleral icterus.       Right eye: No discharge.        Left eye: No discharge.     Conjunctiva/sclera: Conjunctivae normal.  Neck:     Thyroid: No thyromegaly.     Vascular: No carotid bruit.  Cardiovascular:     Rate and Rhythm: Normal rate and regular rhythm.  Pulses: Normal pulses.     Heart sounds: Normal heart sounds.  Pulmonary:     Effort: Pulmonary effort is normal. No respiratory distress.     Breath sounds: No wheezing.  Chest:     Breasts:        Right: No mass, nipple discharge, skin change or tenderness.        Left: No mass, nipple discharge, skin change or tenderness.  Abdominal:     General: Bowel sounds are normal.     Palpations: Abdomen is soft.     Tenderness: There is no abdominal tenderness.  Musculoskeletal:     Cervical back: Normal range of motion. No erythema.     Right lower leg: No edema.     Left lower leg: No edema.  Lymphadenopathy:     Cervical: No cervical adenopathy.  Skin:    General: Skin is warm and dry.     Capillary Refill: Capillary refill takes less than 2 seconds.     Findings: No rash.  Neurological:     General: No focal deficit present.     Mental Status: She is alert and oriented to person, place, and  time.     Cranial Nerves: No cranial nerve deficit.     Sensory: No sensory deficit.     Deep Tendon Reflexes: Reflexes are normal and symmetric.  Psychiatric:        Speech: Speech normal.        Behavior: Behavior normal.        Thought Content: Thought content normal.     Wt Readings from Last 3 Encounters:  11/02/19 164 lb (74.4 kg)  08/31/19 165 lb (74.8 kg)  08/31/19 165 lb (74.8 kg)    BP 112/70   Pulse 74   Temp 98.1 F (36.7 C) (Oral)   Ht 5\' 2"  (1.575 m)   Wt 164 lb (74.4 kg)   SpO2 95%   BMI 30.00 kg/m   Assessment and Plan: 1. Annual physical exam Normal exam Continue exercise as able, healthy diet She declines Flu vaccine - POCT urinalysis dipstick  2. Encounter for screening mammogram for breast cancer Schedule mammogram in - MM 3D SCREEN BREAST BILATERAL; Future  3. Essential (primary) hypertension Clinically stable exam with well controlled BP on metoprolol. Tolerating medications without side effects at this time. Pt to continue current regimen and low sodium diet; benefits of regular exercise as able discussed. - Comprehensive metabolic panel - TSH  4. Coronary artery disease involving native coronary artery of native heart with angina pectoris (HCC) Followed by cardiology She is stable without chest pain/angina, change in baseline DOE  5. Gastro-esophageal reflux disease without esophagitis Followed by GI - symptoms fairly well controlled on omeprazole 20 mg daily and imipramine that was added last year. No red flag signs or symptoms noted so will continue current regimen. - CBC with Differential/Platelet  6. Hyperlipidemia, mixed Tolerating statin medication without side effects at this time LDL is at goal of < 70 on current dose of Crestor 40 mg Continue same therapy without change at this time. - Lipid panel   Partially dictated using [place]. Any errors are unintentional.  Animal nutritionist, MD [facility]  11/02/2019

## 2019-11-03 LAB — CBC WITH DIFFERENTIAL/PLATELET
Basophils Absolute: 0.1 10*3/uL (ref 0.0–0.2)
Basos: 2 %
EOS (ABSOLUTE): 0.1 10*3/uL (ref 0.0–0.4)
Eos: 3 %
Hematocrit: 42 % (ref 34.0–46.6)
Hemoglobin: 14 g/dL (ref 11.1–15.9)
Immature Grans (Abs): 0 10*3/uL (ref 0.0–0.1)
Immature Granulocytes: 0 %
Lymphocytes Absolute: 2 10*3/uL (ref 0.7–3.1)
Lymphs: 41 %
MCH: 29.9 pg (ref 26.6–33.0)
MCHC: 33.3 g/dL (ref 31.5–35.7)
MCV: 90 fL (ref 79–97)
Monocytes Absolute: 0.5 10*3/uL (ref 0.1–0.9)
Monocytes: 10 %
Neutrophils Absolute: 2.1 10*3/uL (ref 1.4–7.0)
Neutrophils: 44 %
Platelets: 313 10*3/uL (ref 150–450)
RBC: 4.69 x10E6/uL (ref 3.77–5.28)
RDW: 13.5 % (ref 11.7–15.4)
WBC: 4.8 10*3/uL (ref 3.4–10.8)

## 2019-11-03 LAB — COMPREHENSIVE METABOLIC PANEL
ALT: 18 IU/L (ref 0–32)
AST: 19 IU/L (ref 0–40)
Albumin/Globulin Ratio: 1.7 (ref 1.2–2.2)
Albumin: 4.5 g/dL (ref 3.8–4.8)
Alkaline Phosphatase: 65 IU/L (ref 39–117)
BUN/Creatinine Ratio: 19 (ref 12–28)
BUN: 14 mg/dL (ref 8–27)
Bilirubin Total: 0.3 mg/dL (ref 0.0–1.2)
CO2: 27 mmol/L (ref 20–29)
Calcium: 10 mg/dL (ref 8.7–10.3)
Chloride: 100 mmol/L (ref 96–106)
Creatinine, Ser: 0.73 mg/dL (ref 0.57–1.00)
GFR calc Af Amer: 98 mL/min/{1.73_m2} (ref >59)
GFR calc non Af Amer: 85 mL/min/{1.73_m2} (ref >59)
Globulin, Total: 2.6 g/dL (ref 1.5–4.5)
Glucose: 85 mg/dL (ref 65–99)
Potassium: 5 mmol/L (ref 3.5–5.2)
Sodium: 140 mmol/L (ref 134–144)
Total Protein: 7.1 g/dL (ref 6.0–8.5)

## 2019-11-03 LAB — TSH: TSH: 1.4 u[IU]/mL (ref 0.450–4.500)

## 2019-11-03 LAB — LIPID PANEL
Chol/HDL Ratio: 2.3 ratio (ref 0.0–4.4)
Cholesterol, Total: 140 mg/dL (ref 100–199)
HDL: 61 mg/dL (ref >39)
LDL Chol Calc (NIH): 57 mg/dL (ref 0–99)
Triglycerides: 127 mg/dL (ref 0–149)
VLDL Cholesterol Cal: 22 mg/dL (ref 5–40)

## 2019-11-21 DIAGNOSIS — I2510 Atherosclerotic heart disease of native coronary artery without angina pectoris: Secondary | ICD-10-CM | POA: Diagnosis not present

## 2019-11-21 DIAGNOSIS — R079 Chest pain, unspecified: Secondary | ICD-10-CM | POA: Diagnosis not present

## 2019-11-21 DIAGNOSIS — R072 Precordial pain: Secondary | ICD-10-CM | POA: Diagnosis not present

## 2019-11-22 DIAGNOSIS — Z5181 Encounter for therapeutic drug level monitoring: Secondary | ICD-10-CM | POA: Diagnosis not present

## 2019-11-22 DIAGNOSIS — I252 Old myocardial infarction: Secondary | ICD-10-CM | POA: Diagnosis not present

## 2019-11-22 DIAGNOSIS — M545 Low back pain: Secondary | ICD-10-CM | POA: Diagnosis not present

## 2019-11-22 DIAGNOSIS — Z79891 Long term (current) use of opiate analgesic: Secondary | ICD-10-CM | POA: Diagnosis not present

## 2019-11-22 DIAGNOSIS — Z79899 Other long term (current) drug therapy: Secondary | ICD-10-CM | POA: Diagnosis not present

## 2019-11-22 DIAGNOSIS — G8929 Other chronic pain: Secondary | ICD-10-CM | POA: Diagnosis not present

## 2019-11-22 DIAGNOSIS — M5030 Other cervical disc degeneration, unspecified cervical region: Secondary | ICD-10-CM | POA: Diagnosis not present

## 2019-11-22 DIAGNOSIS — M5136 Other intervertebral disc degeneration, lumbar region: Secondary | ICD-10-CM | POA: Diagnosis not present

## 2019-11-22 DIAGNOSIS — K2210 Ulcer of esophagus without bleeding: Secondary | ICD-10-CM | POA: Diagnosis not present

## 2019-11-22 DIAGNOSIS — M797 Fibromyalgia: Secondary | ICD-10-CM | POA: Diagnosis not present

## 2019-12-09 DIAGNOSIS — R072 Precordial pain: Secondary | ICD-10-CM | POA: Diagnosis not present

## 2019-12-09 DIAGNOSIS — I2510 Atherosclerotic heart disease of native coronary artery without angina pectoris: Secondary | ICD-10-CM | POA: Diagnosis not present

## 2020-01-03 DIAGNOSIS — K2100 Gastro-esophageal reflux disease with esophagitis, without bleeding: Secondary | ICD-10-CM | POA: Diagnosis not present

## 2020-01-03 DIAGNOSIS — E785 Hyperlipidemia, unspecified: Secondary | ICD-10-CM | POA: Diagnosis not present

## 2020-01-03 DIAGNOSIS — K2210 Ulcer of esophagus without bleeding: Secondary | ICD-10-CM | POA: Diagnosis not present

## 2020-01-03 DIAGNOSIS — K2900 Acute gastritis without bleeding: Secondary | ICD-10-CM | POA: Diagnosis not present

## 2020-01-03 DIAGNOSIS — I2510 Atherosclerotic heart disease of native coronary artery without angina pectoris: Secondary | ICD-10-CM | POA: Diagnosis not present

## 2020-01-03 DIAGNOSIS — K2970 Gastritis, unspecified, without bleeding: Secondary | ICD-10-CM | POA: Diagnosis not present

## 2020-01-03 DIAGNOSIS — G43909 Migraine, unspecified, not intractable, without status migrainosus: Secondary | ICD-10-CM | POA: Diagnosis not present

## 2020-01-03 DIAGNOSIS — I10 Essential (primary) hypertension: Secondary | ICD-10-CM | POA: Diagnosis not present

## 2020-01-03 DIAGNOSIS — M797 Fibromyalgia: Secondary | ICD-10-CM | POA: Diagnosis not present

## 2020-01-03 DIAGNOSIS — I252 Old myocardial infarction: Secondary | ICD-10-CM | POA: Diagnosis not present

## 2020-01-05 ENCOUNTER — Other Ambulatory Visit: Payer: Self-pay

## 2020-03-06 DIAGNOSIS — M5416 Radiculopathy, lumbar region: Secondary | ICD-10-CM | POA: Diagnosis not present

## 2020-03-06 DIAGNOSIS — M5136 Other intervertebral disc degeneration, lumbar region: Secondary | ICD-10-CM | POA: Diagnosis not present

## 2020-03-15 DIAGNOSIS — M47817 Spondylosis without myelopathy or radiculopathy, lumbosacral region: Secondary | ICD-10-CM | POA: Diagnosis not present

## 2020-05-08 DIAGNOSIS — M5416 Radiculopathy, lumbar region: Secondary | ICD-10-CM | POA: Diagnosis not present

## 2020-05-08 DIAGNOSIS — M5136 Other intervertebral disc degeneration, lumbar region: Secondary | ICD-10-CM | POA: Diagnosis not present

## 2020-05-21 ENCOUNTER — Encounter: Payer: Self-pay | Admitting: Internal Medicine

## 2020-05-21 ENCOUNTER — Telehealth: Payer: Self-pay

## 2020-05-21 ENCOUNTER — Other Ambulatory Visit: Payer: Self-pay

## 2020-05-21 ENCOUNTER — Ambulatory Visit (INDEPENDENT_AMBULATORY_CARE_PROVIDER_SITE_OTHER): Payer: Medicare PPO | Admitting: Internal Medicine

## 2020-05-21 VITALS — Temp 98.4°F | Ht 62.0 in

## 2020-05-21 DIAGNOSIS — J040 Acute laryngitis: Secondary | ICD-10-CM

## 2020-05-21 MED ORDER — LEVOFLOXACIN 500 MG PO TABS: 500.0000 mg | ORAL_TABLET | Freq: Every day | ORAL | 0 refills | Status: AC

## 2020-05-21 NOTE — Progress Notes (Signed)
Date:  05/21/2020   Name:  Jordan Baxter   DOB:  1951-05-24   MRN:  [id number]  I connected with this patient, Jordan Baxter, by telephone at the patient's home.  I verified that I am speaking with the correct person using two identifiers. This visit was conducted via telephone due to the Covid-19 outbreak from my office at [address] [facility] in [place], [place]. Patient has tele health consent on file.  Chief Complaint: Sore Throat (x3 days, cant talk, hurts to swallow, no fever, a little coughing)  Sore Throat  This is a new problem. The current episode started in the past 7 days. The problem has been unchanged. There has been no fever. The pain is mild. Associated symptoms include coughing, a hoarse voice and trouble swallowing. Pertinent negatives include no congestion, headaches or shortness of breath. She has had no exposure to strep. She has tried gargles for the symptoms.   Immunization History  Administered Date(s) Administered   Fluad Quad(high Dose 65+) 08/10/2019   Influenza, High Dose Seasonal PF 08/25/2018   Influenza,inj,Quad PF,6+ Mos 07/30/2017   Influenza-Unspecified 07/22/2015   Pneumococcal Conjugate-13 08/22/2016   Pneumococcal Polysaccharide-23 11/13/2009, 07/30/2017   Tdap 05/03/2014   Zoster 10/15/2011   Zoster Recombinat (Shingrix) 11/03/2017, 01/01/2018    Lab Results  Component Value Date   CREATININE 0.73 11/02/2019   BUN 14 11/02/2019   NA 140 11/02/2019   K 5.0 11/02/2019   CL 100 11/02/2019   CO2 27 11/02/2019   Lab Results  Component Value Date   CHOL 140 11/02/2019   HDL 61 11/02/2019   LDLCALC 57 11/02/2019   TRIG 127 11/02/2019   CHOLHDL 2.3 11/02/2019   Lab Results  Component Value Date   TSH 1.400 11/02/2019   No results found for: HGBA1C Lab Results  Component Value Date   WBC 4.8 11/02/2019   HGB 14.0 11/02/2019   HCT 42.0 11/02/2019   MCV 90 11/02/2019   PLT 313 11/02/2019   Lab Results  Component Value Date    ALT 18 11/02/2019   AST 19 11/02/2019   ALKPHOS 65 11/02/2019   BILITOT 0.3 11/02/2019    Review of Systems  Constitutional: Negative for chills, fatigue and fever.  HENT: Positive for hoarse voice, postnasal drip, sore throat and trouble swallowing. Negative for congestion, hearing loss and sinus pressure.        No loss of taste or smell  Respiratory: Positive for cough. Negative for chest tightness, shortness of breath and wheezing.   Cardiovascular: Negative for chest pain and palpitations.  Neurological: Negative for dizziness, light-headedness and headaches.  Hematological: Negative for adenopathy.    Patient Active Problem List   Diagnosis Date Noted   BMI 32.0-32.9,adult 05/21/2016   Tobacco use disorder, moderate, in sustained remission 04/23/2016   H/O cardiac catheterization 03/20/2015   Coronary artery disease involving native heart with angina pectoris (HCC) 03/19/2015   Essential (primary) hypertension 03/19/2015   Fibrositis 03/19/2015   Gastro-esophageal reflux disease without esophagitis 03/19/2015   Hyperlipidemia, mixed 03/19/2015   H/O arthrodesis 03/19/2015    Allergies  Allergen Reactions   Azithromycin Diarrhea   Cephalosporins    Ibuprofen Nausea Only   Penicillins    Sulfa Antibiotics Photosensitivity    Past Surgical History:  Procedure Laterality Date   ABDOMINAL HYSTERECTOMY  1986   CARPAL TUNNEL RELEASE Right    CERVICAL FUSION  2015   CESAREAN SECTION  1984   COLONOSCOPY WITH ESOPHAGOGASTRODUODENOSCOPY (EGD)  08/31/2018   Dr. Earlean Polka   KNEE ARTHROSCOPY Right    PARTIAL HYSTERECTOMY     ROTATOR CUFF REPAIR Left     Social History   Tobacco Use   Smoking status: Former Smoker    Packs/day: 0.50    Years: 45.00    Pack years: 22.50    Types: Cigarettes    Quit date: 03/13/2012    Years since quitting: 8.1   Smokeless tobacco: Never Used  Vaping Use   Vaping Use: Never used  Substance Use Topics   Alcohol  use: No    Alcohol/week: 0.0 standard drinks   Drug use: No     Medication list has been reviewed and updated.  Current Meds  Medication Sig   acetaminophen-codeine (TYLENOL #4) 300-60 MG per tablet Take 1-2 tablets by mouth 4 (four) times daily as needed.    ANORO ELLIPTA 62.5-25 MCG/INH AEPB INHALE 1 PUFF INTO THE LUNGS DAILY (Patient taking differently: as needed. )   fluticasone (FLONASE) 50 MCG/ACT nasal spray SHAKE LIQUID AND USE 2 SPRAYS IN EACH NOSTRIL DAILY (Patient taking differently: as needed. )   imipramine (TOFRANIL) 25 MG tablet Take 2 tablets by mouth at bedtime. sleep   lidocaine (LIDODERM) 5 % Place 1 patch onto the skin as needed.    metoprolol tartrate (LOPRESSOR) 25 MG tablet Take 25 mg by mouth 2 (two) times daily.    nitroGLYCERIN (NITROSTAT) 0.4 MG SL tablet Place 1 tablet under the tongue daily as needed.   omeprazole (PRILOSEC) 20 MG capsule TAKE 1 CAPSULE(20 MG) BY MOUTH TWICE DAILY BEFORE A MEAL (Patient taking differently: Take 20 mg by mouth daily. )   rosuvastatin (CRESTOR) 40 MG tablet TK 1 T PO ONCE D.   tiZANidine (ZANAFLEX) 4 MG tablet Take 4 mg by mouth 3 (three) times daily.    triamterene-hydrochlorothiazide (MAXZIDE-25) 37.5-25 MG tablet Take 1 tablet by mouth daily.   WIXELA INHUB 100-50 MCG/DOSE AEPB INHALE 1 PUFF INTO THE LUNGS TWICE DAILY (Patient taking differently: as needed. )    PHQ 2/9 Scores 11/02/2019 08/31/2019 08/31/2019 08/10/2019  PHQ - 2 Score 0 0 1 2  PHQ- 9 Score 0 0 4 2    No flowsheet data found.  BP Readings from Last 3 Encounters:  11/02/19 112/70  08/31/19 118/85  08/10/19 118/82    Physical Exam HENT:     Mouth/Throat:     Comments: Voice raspy and hoarse Pulmonary:     Effort: Pulmonary effort is normal.     Breath sounds: Normal breath sounds.     Comments: No cough or dyspnea noted during the call Neurological:     Mental Status: She is alert.  Psychiatric:        Attention and Perception:  Attention normal.        Mood and Affect: Mood normal.     Wt Readings from Last 3 Encounters:  11/02/19 164 lb (74.4 kg)  08/31/19 165 lb (74.8 kg)  08/31/19 165 lb (74.8 kg)    Temp 98.4 F (36.9 C) (Temporal)    Ht 5\' 2"  (1.575 m)    BMI 30.00 kg/m   Assessment and Plan: 1. Laryngitis With tendency to develop bronchitis. Will treat with levaquin due to multiple antibiotic allergies Continue to gargle and use tylenol if needed If sx worsen or new sx develop - recommend UC visit for Covid testing - levofloxacin (LEVAQUIN) 500 MG tablet; Take 1 tablet (500 mg total) by mouth daily for 7 days.  Dispense: 7 tablet; Refill: 0  I spent 12 minutes on this encounter. Partially dictated using Animal nutritionist. Any errors are unintentional.  Bari Edward, MD [facility]  05/21/2020

## 2020-05-21 NOTE — Telephone Encounter (Signed)
This visit type is being conducted due to national recommendations for restrictions regarding the COVID- 19 Pandemic (e.g. social distancing) in effort to limit this patients exposure and mitigate transmission in our community. This visit type is felt to be most appropriate for this patient at this time. I connected with the patient today and received telephone consent from the patient and patient understand this consent will be good for 1 year.  KP  

## 2020-05-31 DIAGNOSIS — M47817 Spondylosis without myelopathy or radiculopathy, lumbosacral region: Secondary | ICD-10-CM | POA: Diagnosis not present

## 2020-05-31 DIAGNOSIS — M25512 Pain in left shoulder: Secondary | ICD-10-CM | POA: Diagnosis not present

## 2020-07-03 DIAGNOSIS — M479 Spondylosis, unspecified: Secondary | ICD-10-CM | POA: Diagnosis not present

## 2020-07-03 DIAGNOSIS — Z79899 Other long term (current) drug therapy: Secondary | ICD-10-CM | POA: Diagnosis not present

## 2020-07-03 DIAGNOSIS — M5416 Radiculopathy, lumbar region: Secondary | ICD-10-CM | POA: Diagnosis not present

## 2020-07-03 DIAGNOSIS — Z5181 Encounter for therapeutic drug level monitoring: Secondary | ICD-10-CM | POA: Diagnosis not present

## 2020-07-03 DIAGNOSIS — M5136 Other intervertebral disc degeneration, lumbar region: Secondary | ICD-10-CM | POA: Diagnosis not present

## 2020-07-03 DIAGNOSIS — M47896 Other spondylosis, lumbar region: Secondary | ICD-10-CM | POA: Diagnosis not present

## 2020-07-03 DIAGNOSIS — Z79891 Long term (current) use of opiate analgesic: Secondary | ICD-10-CM | POA: Diagnosis not present

## 2020-07-07 ENCOUNTER — Other Ambulatory Visit: Payer: Self-pay | Admitting: Internal Medicine

## 2020-07-07 DIAGNOSIS — R600 Localized edema: Secondary | ICD-10-CM

## 2020-07-07 NOTE — Telephone Encounter (Signed)
Requested Prescriptions  Pending Prescriptions Disp Refills  . omeprazole (PRILOSEC) 20 MG capsule [Pharmacy Med Name: OMEPRAZOLE 20MG  CAPSULES] 180 capsule 3    Sig: TAKE 1 CAPSULE(20 MG) BY MOUTH TWICE DAILY BEFORE A MEAL     Gastroenterology: Proton Pump Inhibitors Passed - 07/07/2020  6:27 AM      Passed - Valid encounter within last 12 months    Recent Outpatient Visits          1 month ago Laryngitis   [facility] 07/09/2020, MD   8 months ago Annual physical exam   [facility], MD   10 months ago Viral URI   [facility], MD   11 months ago Chronic night sweats   [facility], MD   1 year ago Acute non-recurrent maxillary sinusitis   [facility] Reubin Milan, MD      Future Appointments            In 4 months Reubin Milan [name], MD [facility]           . triamterene-hydrochlorothiazide (MAXZIDE-25) 37.5-25 MG tablet [Pharmacy Med Name: TRIAMTERENE 37.5MG / HCTZ 25MG  TABS] 90 tablet 1    Sig: TAKE 1 TABLET BY MOUTH DAILY     Cardiovascular: Diuretic Combos Passed - 07/07/2020  6:27 AM      Passed - K in normal range and within 360 days    Potassium  Date Value Ref Range Status  11/02/2019 5.0 3.5 - 5.2 mmol/L Final         Passed - Na in normal range and within 360 days    Sodium  Date Value Ref Range Status  11/02/2019 140 134 - 144 mmol/L Final         Passed - Cr in normal range and within 360 days    Creatinine, Ser  Date Value Ref Range Status  11/02/2019 0.73 0.57 - 1.00 mg/dL Final         Passed - Ca in normal range and within 360 days    Calcium  Date Value Ref Range Status  11/02/2019 10.0 8.7 - 10.3 mg/dL Final         Passed - Last BP in normal range    BP Readings from Last 1 Encounters:  11/02/19 112/70         Passed - Valid encounter within last 6 months    Recent Outpatient Visits          1 month ago  Laryngitis   [facility] 11/04/2019, MD   8 months ago Annual physical exam   [facility] Reubin Milan, MD   10 months ago Viral URI   [facility] Reubin Milan, MD   11 months ago Chronic night sweats   [facility] Reubin Milan, MD   1 year ago Acute non-recurrent maxillary sinusitis   [facility] Reubin Milan, MD      Future Appointments            In 4 months [facility] Reubin Milan, MD [facility]

## 2020-07-12 DIAGNOSIS — Z1231 Encounter for screening mammogram for malignant neoplasm of breast: Secondary | ICD-10-CM | POA: Diagnosis not present

## 2020-08-20 DIAGNOSIS — I10 Essential (primary) hypertension: Secondary | ICD-10-CM | POA: Diagnosis not present

## 2020-08-20 DIAGNOSIS — M47817 Spondylosis without myelopathy or radiculopathy, lumbosacral region: Secondary | ICD-10-CM | POA: Diagnosis not present

## 2020-08-20 HISTORY — PX: BACK SURGERY: SHX140

## 2020-09-03 ENCOUNTER — Ambulatory Visit (INDEPENDENT_AMBULATORY_CARE_PROVIDER_SITE_OTHER): Payer: Medicare PPO

## 2020-09-03 DIAGNOSIS — Z0000 Encounter for general adult medical examination without abnormal findings: Secondary | ICD-10-CM

## 2020-09-03 DIAGNOSIS — Z780 Asymptomatic menopausal state: Secondary | ICD-10-CM | POA: Diagnosis not present

## 2020-09-03 NOTE — Progress Notes (Signed)
Subjective:   Jordan Baxter is a 69 y.o. female who presents for Medicare Annual (Subsequent) preventive examination.  Virtual Visit via Telephone Note  I connected with  Jordan Baxter on 09/03/20 at  8:00 AM EST by telephone and verified that I am speaking with the correct person using two identifiers.  Medicare Annual Wellness visit completed telephonically due to Covid-19 pandemic.   Location: Patient: home Provider: [facility]   I discussed the limitations, risks, security and privacy concerns of performing an evaluation and management service by telephone and the availability of in person appointments. The patient expressed understanding and agreed to proceed.  Unable to perform video visit due to video visit attempted and failed and/or patient does not have video capability.   Some vital signs may be absent or patient reported.   Reather Littler, LPN    Review of Systems     Cardiac Risk Factors include: advanced age (>53men, >60 women);dyslipidemia;hypertension     Objective:    Today's Vitals   09/03/20 0821  PainSc: 4    There is no height or weight on file to calculate BMI.  Advanced Directives 09/03/2020 08/31/2019 07/30/2017  Does Patient Have a Medical Advance Directive? Yes Yes Yes  Type of Estate agent of [place];Living will Healthcare Power of [place];Living will Healthcare Power of [place];Living will  Copy of Healthcare Power of Attorney in Chart? No - copy requested No - copy requested No - copy requested    Current Medications (verified) Outpatient Encounter Medications as of 09/03/2020  Medication Sig  . acetaminophen-codeine (TYLENOL #4) 300-60 MG per tablet Take 1-2 tablets by mouth 4 (four) times daily as needed.   Ailene Ards ELLIPTA 62.5-25 MCG/INH AEPB INHALE 1 PUFF INTO THE LUNGS DAILY (Patient taking differently: as needed. )  . calcium carbonate (OS-CAL - DOSED IN MG OF ELEMENTAL CALCIUM) 1250 (500 Ca) MG tablet Take 1 tablet  by mouth.  Marland Kitchen imipramine (TOFRANIL) 25 MG tablet Take 2 tablets by mouth at bedtime. sleep  . lidocaine (LIDODERM) 5 % Place 1 patch onto the skin as needed.   . metoprolol tartrate (LOPRESSOR) 25 MG tablet Take 25 mg by mouth 2 (two) times daily.   . nitroGLYCERIN (NITROSTAT) 0.4 MG SL tablet Place 1 tablet under the tongue daily as needed.  Marland Kitchen omeprazole (PRILOSEC) 20 MG capsule TAKE 1 CAPSULE(20 MG) BY MOUTH TWICE DAILY BEFORE A MEAL  . rosuvastatin (CRESTOR) 40 MG tablet TK 1 T PO ONCE D.  . tiZANidine (ZANAFLEX) 4 MG tablet Take 4 mg by mouth 3 (three) times daily.   Marland Kitchen triamterene-hydrochlorothiazide (MAXZIDE-25) 37.5-25 MG tablet TAKE 1 TABLET BY MOUTH DAILY  . fluticasone (FLONASE) 50 MCG/ACT nasal spray SHAKE LIQUID AND USE 2 SPRAYS IN EACH NOSTRIL DAILY (Patient not taking: Reported on 09/03/2020)  . WIXELA INHUB 100-50 MCG/DOSE AEPB INHALE 1 PUFF INTO THE LUNGS TWICE DAILY (Patient not taking: Reported on 09/03/2020)  . [DISCONTINUED] CARAFATE 1 GM/10ML suspension Take 1 g by mouth 3 (three) times daily.  . [DISCONTINUED] dicyclomine (BENTYL) 10 MG capsule    No facility-administered encounter medications on file as of 09/03/2020.    Allergies (verified) Azithromycin, Cephalosporins, Ibuprofen, Penicillins, and Sulfa antibiotics   History: Past Medical History:  Diagnosis Date  . Allergy   . Arthritis   . Fibrositis   . GERD (gastroesophageal reflux disease)   . Hyperlipidemia   . Hypertension    Past Surgical History:  Procedure Laterality Date  . ABDOMINAL HYSTERECTOMY  1986  .  BACK SURGERY  08/20/2020   L4 L5 S1 nerve ablation [facility]  . CARPAL TUNNEL RELEASE Right   . CERVICAL FUSION  2015  . CESAREAN SECTION  1984  . COLONOSCOPY WITH ESOPHAGOGASTRODUODENOSCOPY (EGD)  08/31/2018   Dr. Earlean Polka  . KNEE ARTHROSCOPY Right   . PARTIAL HYSTERECTOMY    . ROTATOR CUFF REPAIR Left    Family History  Problem Relation Age of Onset  . CAD Mother   . Heart disease  Mother   . Cancer Father   . Heart disease Sister   . Heart attack Sister   . Heart disease Brother   . Heart attack Sister   . Arthritis Paternal Grandmother    Social History   Socioeconomic History  . Marital status: Divorced    Spouse name: Not on file  . Number of children: 1  . Years of education: some college  . Highest education level: Not on file  Occupational History  . Occupation: retired  Tobacco Use  . Smoking status: Former Smoker    Packs/day: 0.50    Years: 45.00    Pack years: 22.50    Types: Cigarettes    Quit date: 03/13/2012    Years since quitting: 8.4  . Smokeless tobacco: Never Used  Vaping Use  . Vaping Use: Never used  Substance and Sexual Activity  . Alcohol use: No    Alcohol/week: 0.0 standard drinks  . Drug use: No  . Sexual activity: Never  Other Topics Concern  . Not on file  Social History Narrative   Pt lives alone   Social Determinants of Health   Financial Resource Strain: Low Risk   . Difficulty of Paying Living Expenses: Not hard at all  Food Insecurity: No Food Insecurity  . Worried About Programme researcher, broadcasting/film/video in the Last Year: Never true  . Ran Out of Food in the Last Year: Never true  Transportation Needs: No Transportation Needs  . Lack of Transportation (Medical): No  . Lack of Transportation (Non-Medical): No  Physical Activity: Insufficiently Active  . Days of Exercise per Week: 6 days  . Minutes of Exercise per Session: 20 min  Stress: No Stress Concern Present  . Feeling of Stress : Not at all  Social Connections: Moderately Integrated  . Frequency of Communication with Friends and Family: More than three times a week  . Frequency of Social Gatherings with Friends and Family: More than three times a week  . Attends Religious Services: More than 4 times per year  . Active Member of Clubs or Organizations: Yes  . Attends Banker Meetings: More than 4 times per year  . Marital Status: Divorced     Tobacco Counseling Counseling given: Not Answered   Clinical Intake:  Pre-visit preparation completed: Yes  Pain : 0-10 Pain Score: 4  Pain Type: Chronic pain Pain Location: Back Pain Orientation: Mid, Lower, Right, Left Pain Descriptors / Indicators: Aching, Sore, Operative site guarding Pain Onset: More than a month ago Pain Frequency: Constant     Nutritional Risks: None Diabetes: No  How often do you need to have someone help you when you read instructions, pamphlets, or other written materials from your doctor or pharmacy?: 1 - Never    Interpreter Needed?: No  Information entered by :: Reather Littler LPN   Activities of Daily Living In your present state of health, do you have any difficulty performing the following activities: 09/03/2020  Hearing? N  Comment declines hearing  aids  Vision? N  Difficulty concentrating or making decisions? N  Walking or climbing stairs? N  Dressing or bathing? N  Doing errands, shopping? N  Preparing Food and eating ? N  Using the Toilet? N  In the past six months, have you accidently leaked urine? N  Do you have problems with loss of bowel control? N  Managing your Medications? N  Managing your Finances? N  Housekeeping or managing your Housekeeping? N  Some recent data might be hidden    Patient Care Team: Reubin Milan, MD as PCP - General (Internal Medicine) Loren Racer, MD as Referring Physician (Physical Medicine and Rehabilitation) Laretta Bolster, MD as Referring Physician (Cardiology) Lacey Jensen, MD as Consulting Physician (Gastroenterology)  Indicate any recent Medical Services you may have received from other than [facility] providers in the past year (date may be approximate).     Assessment:   This is a routine wellness examination for Jordan Baxter.  Hearing/Vision screen  Hearing Screening             Right ear:           Left ear:            Comments: Pt denies hearing difficulty  Vision Screening Comments: Annual vision screenings at [facility] in [place]  Dietary issues and exercise activities discussed: Current Exercise Habits: Home exercise routine, Type of exercise: walking, Time (Minutes): 20, Frequency (Times/Week): 6, Weekly Exercise (Minutes/Week): 120, Intensity: Mild, Exercise limited by: orthopedic condition(s)  Goals    . Increase physical activity     Recommend increasing physical activity 3 days per week for 30 minutes.     . Reduce sugar intake to X grams per day     Recommend to eliminate sweets from your diet      Depression Screen PHQ 2/9 Scores 09/03/2020 11/02/2019 08/31/2019 08/31/2019 08/10/2019 08/25/2018 07/30/2017  PHQ - 2 Score 0 0 0 1 2 0 0  PHQ- 9 Score - 0 0 4 2 - -    Fall Risk Fall Risk  09/03/2020 08/31/2019 08/25/2018 07/30/2017 08/22/2016  Falls in the past year? 0 0 1 Yes No  Number falls in past yr: 0 0 1 1 -  Injury with Fall? 0 0 0 No -  Risk for fall due to : Orthopedic patient - - Medication side effect -  Follow up Falls prevention discussed Falls prevention discussed - Education provided;Falls prevention discussed -    Any stairs in or around the home? Yes  If so, are there any without handrails? Yes - step inside down into living area Home free of loose throw rugs in walkways, pet beds, electrical cords, etc? Yes  Adequate lighting in your home to reduce risk of falls? Yes   ASSISTIVE DEVICES UTILIZED TO PREVENT FALLS:  Life alert? No  Use of a cane, walker or w/c? No  Grab bars in the bathroom? Yes  Shower chair or bench in shower? No  Elevated toilet seat or a handicapped toilet? Yes   TIMED UP AND GO:  Was the test performed? No . Telephonic visit.   Cognitive Function: Normal cognitive status assessed by direct observation by this Nurse Health Advisor.       6CIT Screen 08/31/2019 08/25/2018 07/30/2017 08/22/2016  What Year? 0 points 0 points 0 points  0 points  What month? 0 points 0 points 0 points 0 points  What time? 0 points 0 points 0 points 0 points  Count back from  20 0 points 0 points 0 points 0 points  Months in reverse 0 points 0 points 0 points 0 points  Repeat phrase 2 points 0 points 0 points 0 points  Total Score 2 0 0 0    Immunizations Immunization History  Administered Date(s) Administered  . Fluad Quad(high Dose 65+) 08/10/2019  . Influenza, High Dose Seasonal PF 08/25/2018  . Influenza,inj,Quad PF,6+ Mos 07/30/2017  . Influenza-Unspecified 07/22/2015  . Pneumococcal Conjugate-13 08/22/2016  . Pneumococcal Polysaccharide-23 11/13/2009, 07/30/2017  . Tdap 05/03/2014  . Zoster 10/15/2011  . Zoster Recombinat (Shingrix) 11/03/2017, 01/01/2018    TDAP status: Up to date   Flu Vaccine status: Declined, Education has been provided regarding the importance of this vaccine but patient still declined. Advised may receive this vaccine at local pharmacy or Health Dept. Aware to provide a copy of the vaccination record if obtained from local pharmacy or Health Dept. Verbalized acceptance and understanding.   Pneumococcal vaccine status: Up to date   Covid-19 vaccine status: Declined, Education has been provided regarding the importance of this vaccine but patient still declined. Advised may receive this vaccine at local pharmacy or Health Dept.or vaccine clinic. Aware to provide a copy of the vaccination record if obtained from local pharmacy or Health Dept. Verbalized acceptance and understanding.  Qualifies for Shingles Vaccine? Yes   Zostavax completed Yes   Shingrix Completed?: Yes  Screening Tests Health Maintenance  Topic Date Due  . COVID-19 Vaccine (1) 09/19/2020 (Originally 05/04/1963)  . INFLUENZA VACCINE  01/10/2021 (Originally 05/13/2020)  . MAMMOGRAM  07/12/2021  . TETANUS/TDAP  05/03/2024  . COLONOSCOPY  01/02/2030  . DEXA SCAN  Completed  . Hepatitis C Screening  Completed  . PNA vac Low Risk Adult   Completed    Health Maintenance  There are no preventive care reminders to display for this patient.  Colorectal cancer screening: Completed 2012. Repeat every 10 years   Mammogram status: Completed 07/12/20. Repeat every year   Bone Density status: Completed 07/01/16. Results reflect: Bone density results: OSTEOPENIA. Repeat every 3 years. Ordered today.   Lung Cancer Screening: (Low Dose CT Chest recommended if Age 41-80 years, 30 pack-year currently smoking OR have quit w/in 15years.) does not qualify.   Additional Screening:  Hepatitis C Screening: does qualify; Completed 02/19/17  Vision Screening: Recommended annual ophthalmology exams for early detection of glaucoma and other disorders of the eye. Is the patient up to date with their annual eye exam?  Yes  Who is the provider or what is the name of the office in which the patient attends annual eye exams? [facility]  Dental Screening: Recommended annual dental exams for proper oral hygiene  Community Resource Referral / Chronic Care Management: CRR required this visit?  No   CCM required this visit?  No      Plan:     I have personally reviewed and noted the following in the patient's chart:   . Medical and social history . Use of alcohol, tobacco or illicit drugs  . Current medications and supplements . Functional ability and status . Nutritional status . Physical activity . Advanced directives . List of other physicians . Hospitalizations, surgeries, and ER visits in previous 12 months . Vitals . Screenings to include cognitive, depression, and falls . Referrals and appointments  In addition, I have reviewed and discussed with patient certain preventive protocols, quality metrics, and best practice recommendations. A written personalized care plan for preventive services as well as general preventive health  recommendations were provided to patient.     Reather Littler, LPN   [id number]   Nurse Notes: pt  reports having back surgery with [facility] 08/20/20; awaiting records.

## 2020-09-03 NOTE — Patient Instructions (Signed)
Jordan Baxter , Thank you for taking time to come for your Medicare Wellness Visit. I appreciate your ongoing commitment to your health goals. Please review the following plan we discussed and let me know if I can assist you in the future.   Screening recommendations/referrals: Colonoscopy: done 2012. Repeat in 2022 Mammogram: done 07/12/20 Bone Density: done 03/31/16. Please call [phone] to schedule your bone density screening.  Recommended yearly ophthalmology/optometry visit for glaucoma screening and checkup Recommended yearly dental visit for hygiene and checkup  Vaccinations: Influenza vaccine: declined Pneumococcal vaccine: done 07/30/17 Tdap vaccine: done 05/03/14 Shingles vaccine: done 11/03/17 & 01/01/18   Covid-19: declined  Advanced directives: Please bring a copy of your health care power of attorney and living will to the office at your convenience.  Conditions/risks identified: Recommend drinking 6-8 glasses of water per day   Next appointment: Follow up in one year for your annual wellness visit    Preventive Care 65 Years and Older, Female Preventive care refers to lifestyle choices and visits with your health care provider that can promote health and wellness. What does preventive care include?  A yearly physical exam. This is also called an annual well check.  Dental exams once or twice a year.  Routine eye exams. Ask your health care provider how often you should have your eyes checked.  Personal lifestyle choices, including:  Daily care of your teeth and gums.  Regular physical activity.  Eating a healthy diet.  Avoiding tobacco and drug use.  Limiting alcohol use.  Practicing safe sex.  Taking low-dose aspirin every day.  Taking vitamin and mineral supplements as recommended by your health care provider. What happens during an annual well check? The services and screenings done by your health care provider during your annual well check will  depend on your age, overall health, lifestyle risk factors, and family history of disease. Counseling  Your health care provider may ask you questions about your:  Alcohol use.  Tobacco use.  Drug use.  Emotional well-being.  Home and relationship well-being.  Sexual activity.  Eating habits.  History of falls.  Memory and ability to understand (cognition).  Work and work Astronomer.  Reproductive health. Screening  You may have the following tests or measurements:  Height, weight, and BMI.  Blood pressure.  Lipid and cholesterol levels. These may be checked every 5 years, or more frequently if you are over 21 years old.  Skin check.  Lung cancer screening. You may have this screening every year starting at age 4 if you have a 30-pack-year history of smoking and currently smoke or have quit within the past 15 years.  Fecal occult blood test (FOBT) of the stool. You may have this test every year starting at age 67.  Flexible sigmoidoscopy or colonoscopy. You may have a sigmoidoscopy every 5 years or a colonoscopy every 10 years starting at age 53.  Hepatitis C blood test.  Hepatitis B blood test.  Sexually transmitted disease (STD) testing.  Diabetes screening. This is done by checking your blood sugar (glucose) after you have not eaten for a while (fasting). You may have this done every 1-3 years.  Bone density scan. This is done to screen for osteoporosis. You may have this done starting at age 70.  Mammogram. This may be done every 1-2 years. Talk to your health care provider about how often you should have regular mammograms. Talk with your health care provider about your test results, treatment options, and if necessary,  the need for more tests. Vaccines  Your health care provider may recommend certain vaccines, such as:  Influenza vaccine. This is recommended every year.  Tetanus, diphtheria, and acellular pertussis (Tdap, Td) vaccine. You may need a  Td booster every 10 years.  Zoster vaccine. You may need this after age 42.  Pneumococcal 13-valent conjugate (PCV13) vaccine. One dose is recommended after age 17.  Pneumococcal polysaccharide (PPSV23) vaccine. One dose is recommended after age 67. Talk to your health care provider about which screenings and vaccines you need and how often you need them. This information is not intended to replace advice given to you by your health care provider. Make sure you discuss any questions you have with your health care provider. Document Released: 10/26/2015 Document Revised: 06/18/2016 Document Reviewed: 07/31/2015 Elsevier Interactive Patient Education  2017 [employer].  Fall Prevention in the Home Falls can cause injuries. They can happen to people of all ages. There are many things you can do to make your home safe and to help prevent falls. What can I do on the outside of my home?  Regularly fix the edges of walkways and driveways and fix any cracks.  Remove anything that might make you trip as you walk through a door, such as a raised step or threshold.  Trim any bushes or trees on the path to your home.  Use bright outdoor lighting.  Clear any walking paths of anything that might make someone trip, such as rocks or tools.  Regularly check to see if handrails are loose or broken. Make sure that both sides of any steps have handrails.  Any raised decks and porches should have guardrails on the edges.  Have any leaves, snow, or ice cleared regularly.  Use sand or salt on walking paths during winter.  Clean up any spills in your garage right away. This includes oil or grease spills. What can I do in the bathroom?  Use night lights.  Install grab bars by the toilet and in the tub and shower. Do not use towel bars as grab bars.  Use non-skid mats or decals in the tub or shower.  If you need to sit down in the shower, use a plastic, non-slip stool.  Keep the floor dry. Clean  up any water that spills on the floor as soon as it happens.  Remove soap buildup in the tub or shower regularly.  Attach bath mats securely with double-sided non-slip rug tape.  Do not have throw rugs and other things on the floor that can make you trip. What can I do in the bedroom?  Use night lights.  Make sure that you have a light by your bed that is easy to reach.  Do not use any sheets or blankets that are too big for your bed. They should not hang down onto the floor.  Have a firm chair that has side arms. You can use this for support while you get dressed.  Do not have throw rugs and other things on the floor that can make you trip. What can I do in the kitchen?  Clean up any spills right away.  Avoid walking on wet floors.  Keep items that you use a lot in easy-to-reach places.  If you need to reach something above you, use a strong step stool that has a grab bar.  Keep electrical cords out of the way.  Do not use floor polish or wax that makes floors slippery. If you must use  wax, use non-skid floor wax.  Do not have throw rugs and other things on the floor that can make you trip. What can I do with my stairs?  Do not leave any items on the stairs.  Make sure that there are handrails on both sides of the stairs and use them. Fix handrails that are broken or loose. Make sure that handrails are as long as the stairways.  Check any carpeting to make sure that it is firmly attached to the stairs. Fix any carpet that is loose or worn.  Avoid having throw rugs at the top or bottom of the stairs. If you do have throw rugs, attach them to the floor with carpet tape.  Make sure that you have a light switch at the top of the stairs and the bottom of the stairs. If you do not have them, ask someone to add them for you. What else can I do to help prevent falls?  Wear shoes that:  Do not have high heels.  Have rubber bottoms.  Are comfortable and fit you well.  Are  closed at the toe. Do not wear sandals.  If you use a stepladder:  Make sure that it is fully opened. Do not climb a closed stepladder.  Make sure that both sides of the stepladder are locked into place.  Ask someone to hold it for you, if possible.  Clearly mark and make sure that you can see:  Any grab bars or handrails.  First and last steps.  Where the edge of each step is.  Use tools that help you move around (mobility aids) if they are needed. These include:  Canes.  Walkers.  Scooters.  Crutches.  Turn on the lights when you go into a dark area. Replace any light bulbs as soon as they burn out.  Set up your furniture so you have a clear path. Avoid moving your furniture around.  If any of your floors are uneven, fix them.  If there are any pets around you, be aware of where they are.  Review your medicines with your doctor. Some medicines can make you feel dizzy. This can increase your chance of falling. Ask your doctor what other things that you can do to help prevent falls. This information is not intended to replace advice given to you by your health care provider. Make sure you discuss any questions you have with your health care provider. Document Released: 07/26/2009 Document Revised: 03/06/2016 Document Reviewed: 11/03/2014 Elsevier Interactive Patient Education  2017 [employer].

## 2020-09-04 DIAGNOSIS — Z79899 Other long term (current) drug therapy: Secondary | ICD-10-CM | POA: Diagnosis not present

## 2020-09-04 DIAGNOSIS — M5416 Radiculopathy, lumbar region: Secondary | ICD-10-CM | POA: Diagnosis not present

## 2020-09-04 DIAGNOSIS — M47896 Other spondylosis, lumbar region: Secondary | ICD-10-CM | POA: Diagnosis not present

## 2020-09-04 DIAGNOSIS — M5136 Other intervertebral disc degeneration, lumbar region: Secondary | ICD-10-CM | POA: Diagnosis not present

## 2020-10-24 ENCOUNTER — Telehealth: Payer: Self-pay

## 2020-10-24 NOTE — Telephone Encounter (Signed)
Copied from CRM [id number]. Topic: Quick Communication - See Telephone Encounter >> Oct 23, 2020  8:25 AM Aretta Nip wrote: CRM for notification. See Telephone encounter for: 10/23/20. Headache, runny nose, runny eyes, sore throat, sometimes, sometimes not,  Pt did not do a covid test, started around Jan 1 with fever of  around 100/ only had a couple days,  seems to not just be able to get rid of symptoms, wants antibiotic and possible appt today [phone]

## 2020-10-24 NOTE — Telephone Encounter (Signed)
Pt has a VV schedule for tomorrow.  KP

## 2020-10-25 ENCOUNTER — Encounter: Payer: Self-pay | Admitting: Internal Medicine

## 2020-10-25 ENCOUNTER — Ambulatory Visit (INDEPENDENT_AMBULATORY_CARE_PROVIDER_SITE_OTHER): Payer: Medicare PPO | Admitting: Internal Medicine

## 2020-10-25 VITALS — Ht 62.0 in

## 2020-10-25 DIAGNOSIS — J0110 Acute frontal sinusitis, unspecified: Secondary | ICD-10-CM

## 2020-10-25 MED ORDER — LEVOFLOXACIN 500 MG PO TABS: 500.0000 mg | ORAL_TABLET | Freq: Every day | ORAL | 0 refills | Status: DC

## 2020-10-25 NOTE — Progress Notes (Signed)
Date:  10/25/2020   Name:  Jordan Baxter   DOB:  [birth year]   MRN:  [id number]  I connected with this patient, Jordan Baxter, by telephone at the patient's home.  I verified that I am speaking with the correct person using two identifiers. This visit was conducted via telephone due to the Covid-19 outbreak from my office at [facility] in [place], [place]. I discussed the limitations, risks, security and privacy concerns of performing an evaluation and management service by telephone. I also discussed with the patient that there may be a patient responsible charge related to this service. The patient expressed understanding and agreed to proceed.  Chief Complaint: Sore Throat (XHeadache, runny nose, runny eyes, cough, sneezing (thick clear mucous))  Sore Throat  This is a new problem. The current episode started 1 to 4 weeks ago. The problem has been unchanged. Maximum temperature: fever only on the first day 99.9. Associated symptoms include congestion, coughing, headaches and a hoarse voice. Pertinent negatives include no diarrhea, ear discharge, shortness of breath, trouble swallowing or vomiting.  Sinus Problem This is a new problem. The current episode started 1 to 4 weeks ago. The problem is unchanged. There has been no fever. Associated symptoms include congestion, coughing, headaches, a hoarse voice, sinus pressure and a sore throat. Pertinent negatives include no chills or shortness of breath. Past treatments include spray decongestants.    Lab Results  Component Value Date   CREATININE 0.73 11/02/2019   BUN 14 11/02/2019   NA 140 11/02/2019   K 5.0 11/02/2019   CL 100 11/02/2019   CO2 27 11/02/2019   Lab Results  Component Value Date   CHOL 140 11/02/2019   HDL 61 11/02/2019   LDLCALC 57 11/02/2019   TRIG 127 11/02/2019   CHOLHDL 2.3 11/02/2019   Lab Results  Component Value Date   TSH 1.400 11/02/2019   No results found for: HGBA1C Lab Results  Component Value  Date   WBC 4.8 11/02/2019   HGB 14.0 11/02/2019   HCT 42.0 11/02/2019   MCV 90 11/02/2019   PLT 313 11/02/2019   Lab Results  Component Value Date   ALT 18 11/02/2019   AST 19 11/02/2019   ALKPHOS 65 11/02/2019   BILITOT 0.3 11/02/2019     Review of Systems  Constitutional: Negative for chills, fatigue and fever.  HENT: Positive for congestion, hoarse voice, sinus pressure and sore throat. Negative for ear discharge and trouble swallowing.   Eyes: Negative for visual disturbance.  Respiratory: Positive for cough. Negative for chest tightness, shortness of breath and wheezing.   Cardiovascular: Negative for chest pain and palpitations.  Gastrointestinal: Positive for nausea. Negative for diarrhea and vomiting.  Neurological: Positive for headaches. Negative for dizziness.    Patient Active Problem List   Diagnosis Date Noted  . BMI 32.0-32.9,adult 05/21/2016  . Tobacco use disorder, moderate, in sustained remission 04/23/2016  . H/O cardiac catheterization 03/20/2015  . Coronary artery disease involving native heart with angina pectoris (HCC) 03/19/2015  . Essential (primary) hypertension 03/19/2015  . Fibrositis 03/19/2015  . Gastro-esophageal reflux disease without esophagitis 03/19/2015  . Hyperlipidemia, mixed 03/19/2015  . H/O arthrodesis 03/19/2015    Allergies  Allergen Reactions  . Azithromycin Diarrhea  . Cephalosporins   . Ibuprofen Nausea Only  . Penicillins   . Sulfa Antibiotics Photosensitivity    Past Surgical History:  Procedure Laterality Date  . ABDOMINAL HYSTERECTOMY  1986  . BACK SURGERY  08/20/2020  L4 L5 S1 nerve ablation [facility]  . CARPAL TUNNEL RELEASE Right   . CERVICAL FUSION  2015  . CESAREAN SECTION  1984  . COLONOSCOPY WITH ESOPHAGOGASTRODUODENOSCOPY (EGD)  08/31/2018   Dr. Earlean Polka  . KNEE ARTHROSCOPY Right   . PARTIAL HYSTERECTOMY    . ROTATOR CUFF REPAIR Left     Social History   Tobacco Use  . Smoking status: Former  Smoker    Packs/day: 0.50    Years: 45.00    Pack years: 22.50    Types: Cigarettes    Quit date: 03/13/2012    Years since quitting: 8.6  . Smokeless tobacco: Never Used  Vaping Use  . Vaping Use: Never used  Substance Use Topics  . Alcohol use: No    Alcohol/week: 0.0 standard drinks  . Drug use: No     Medication list has been reviewed and updated.  Current Meds  Medication Sig  . acetaminophen-codeine (TYLENOL #4) 300-60 MG per tablet Take 1-2 tablets by mouth 4 (four) times daily as needed.   Ailene Ards ELLIPTA 62.5-25 MCG/INH AEPB INHALE 1 PUFF INTO THE LUNGS DAILY (Patient taking differently: as needed.)  . fluticasone (FLONASE) 50 MCG/ACT nasal spray SHAKE LIQUID AND USE 2 SPRAYS IN EACH NOSTRIL DAILY  . imipramine (TOFRANIL) 25 MG tablet Take 2 tablets by mouth at bedtime. sleep  . lidocaine (LIDODERM) 5 % Place 1 patch onto the skin as needed.   . metoprolol tartrate (LOPRESSOR) 25 MG tablet Take 25 mg by mouth 2 (two) times daily.   . nitroGLYCERIN (NITROSTAT) 0.4 MG SL tablet Place 1 tablet under the tongue daily as needed.  Marland Kitchen omeprazole (PRILOSEC) 20 MG capsule TAKE 1 CAPSULE(20 MG) BY MOUTH TWICE DAILY BEFORE A MEAL  . rosuvastatin (CRESTOR) 40 MG tablet TK 1 T PO ONCE D.  . tiZANidine (ZANAFLEX) 4 MG tablet Take 4 mg by mouth 3 (three) times daily.   Marland Kitchen triamterene-hydrochlorothiazide (MAXZIDE-25) 37.5-25 MG tablet TAKE 1 TABLET BY MOUTH DAILY  . vitamin C (ASCORBIC ACID) 250 MG tablet Take 500 mg by mouth daily.  Monte Fantasia INHUB 100-50 MCG/DOSE AEPB INHALE 1 PUFF INTO THE LUNGS TWICE DAILY  . [DISCONTINUED] calcium carbonate (OS-CAL - DOSED IN MG OF ELEMENTAL CALCIUM) 1250 (500 Ca) MG tablet Take 1 tablet by mouth.    PHQ 2/9 Scores 10/25/2020 09/03/2020 11/02/2019 08/31/2019  PHQ - 2 Score 0 0 0 0  PHQ- 9 Score 2 - 0 0    GAD 7 : Generalized Anxiety Score 10/25/2020  Nervous, Anxious, on Edge 0  Control/stop worrying 0  Worry too much - different things 0  Trouble  relaxing 0  Restless 0  Easily annoyed or irritable 1  Afraid - awful might happen 0  Total GAD 7 Score 1    BP Readings from Last 3 Encounters:  11/02/19 112/70  08/31/19 118/85  08/10/19 118/82    Physical Exam HENT:     Nose:     Comments: Patient is tender when she palpates her frontal sinuses Pulmonary:     Effort: Pulmonary effort is normal.     Comments: No dyspnea noted during the call. Occasional dry cough heard. Neurological:     Mental Status: She is alert.  Psychiatric:        Attention and Perception: Attention normal.        Mood and Affect: Mood normal.        Speech: Speech normal.  Cognition and Memory: Cognition normal.     Wt Readings from Last 3 Encounters:  11/02/19 164 lb (74.4 kg)  08/31/19 165 lb (74.8 kg)  08/31/19 165 lb (74.8 kg)    Ht 5\' 2"  (1.575 m)   BMI 30.00 kg/m   Assessment and Plan: 1. Acute non-recurrent frontal sinusitis Continue Flonase spray, fluids Can also use Claritin for sneezing/drainage - levofloxacin (LEVAQUIN) 500 MG tablet; Take 1 tablet (500 mg total) by mouth daily for 7 days.  Dispense: 7 tablet; Refill: 0  I spent 8 minutes on this encounter. Partially dictated using . Any errors are unintentional.  Animal nutritionist, MD [facility]  10/25/2020

## 2020-10-25 NOTE — Addendum Note (Signed)
Addended by: Reubin Milan on: 10/25/2020 08:18 AM   Modules accepted: Kipp Brood

## 2020-11-05 ENCOUNTER — Encounter: Payer: Medicare PPO | Admitting: Internal Medicine

## 2020-11-29 ENCOUNTER — Encounter: Payer: Self-pay | Admitting: Internal Medicine

## 2020-11-29 ENCOUNTER — Other Ambulatory Visit: Payer: Self-pay

## 2020-11-29 ENCOUNTER — Ambulatory Visit (INDEPENDENT_AMBULATORY_CARE_PROVIDER_SITE_OTHER): Payer: Medicare PPO | Admitting: Internal Medicine

## 2020-11-29 VITALS — BP 128/80 | HR 72 | Temp 98.3°F | Ht 62.0 in | Wt 169.0 lb

## 2020-11-29 DIAGNOSIS — I10 Essential (primary) hypertension: Secondary | ICD-10-CM

## 2020-11-29 DIAGNOSIS — K219 Gastro-esophageal reflux disease without esophagitis: Secondary | ICD-10-CM

## 2020-11-29 DIAGNOSIS — Z13820 Encounter for screening for osteoporosis: Secondary | ICD-10-CM | POA: Diagnosis not present

## 2020-11-29 DIAGNOSIS — Z0000 Encounter for general adult medical examination without abnormal findings: Secondary | ICD-10-CM | POA: Diagnosis not present

## 2020-11-29 DIAGNOSIS — E782 Mixed hyperlipidemia: Secondary | ICD-10-CM

## 2020-11-29 DIAGNOSIS — I25119 Atherosclerotic heart disease of native coronary artery with unspecified angina pectoris: Secondary | ICD-10-CM | POA: Diagnosis not present

## 2020-11-29 NOTE — Progress Notes (Signed)
Date:  11/29/2020   Name:  Jordan Baxter   DOB:  [birth year]   MRN:  [id number]   Chief Complaint: Annual Exam (Breast exam no pap)  Jordan Baxter is a 70 y.o. female who presents today for her Complete Annual Exam. She feels well. She reports exercising walking X7 days a week and yard work. She reports she is sleeping poorly. Breast complaints none.  Still struggling with chronic neck and back pain.  She recently had nerve ablation which did not provide any benefit. She continues on tizanidine and tylenol #3.  Mammogram: 06/2020 [facility] DEXA: 06/2016 osteopenia hip; infusions for a while but stopped Pap smear: discontinued Colonoscopy: 12/2019  Immunization History  Administered Date(s) Administered  . Fluad Quad(high Dose 65+) 08/10/2019  . Influenza, High Dose Seasonal PF 08/25/2018  . Influenza,inj,Quad PF,6+ Mos 07/30/2017  . Influenza-Unspecified 07/22/2015  . Pneumococcal Conjugate-13 08/22/2016  . Pneumococcal Polysaccharide-23 11/13/2009, 07/30/2017  . Tdap 05/03/2014  . Zoster 10/15/2011  . Zoster Recombinat (Shingrix) 11/03/2017, 01/01/2018    Hypertension This is a chronic problem. The problem is controlled. Associated symptoms include neck pain. Pertinent negatives include no chest pain, headaches, palpitations or shortness of breath. Past treatments include beta blockers and diuretics. The current treatment provides significant improvement. Hypertensive end-organ damage includes CAD/MI. There is no history of kidney disease or CVA.  Hyperlipidemia The problem is controlled. Pertinent negatives include no chest pain or shortness of breath. Current antihyperlipidemic treatment includes statins. The current treatment provides significant improvement of lipids.  Gastroesophageal Reflux She complains of heartburn. She reports no abdominal pain, no chest pain, no coughing or no wheezing. Pertinent negatives include no fatigue. Risk factors include hiatal hernia. She has tried a PPI  for the symptoms. The treatment provided significant relief.    Lab Results  Component Value Date   CREATININE 0.73 11/02/2019   BUN 14 11/02/2019   NA 140 11/02/2019   K 5.0 11/02/2019   CL 100 11/02/2019   CO2 27 11/02/2019   Lab Results  Component Value Date   CHOL 140 11/02/2019   HDL 61 11/02/2019   LDLCALC 57 11/02/2019   TRIG 127 11/02/2019   CHOLHDL 2.3 11/02/2019   Lab Results  Component Value Date   TSH 1.400 11/02/2019   No results found for: HGBA1C Lab Results  Component Value Date   WBC 4.8 11/02/2019   HGB 14.0 11/02/2019   HCT 42.0 11/02/2019   MCV 90 11/02/2019   PLT 313 11/02/2019   Lab Results  Component Value Date   ALT 18 11/02/2019   AST 19 11/02/2019   ALKPHOS 65 11/02/2019   BILITOT 0.3 11/02/2019     Review of Systems  Constitutional: Negative for chills, fatigue and fever.  HENT: Negative for congestion, hearing loss, tinnitus, trouble swallowing and voice change.   Eyes: Negative for visual disturbance.  Respiratory: Negative for cough, chest tightness, shortness of breath and wheezing.   Cardiovascular: Negative for chest pain, palpitations and leg swelling.  Gastrointestinal: Positive for heartburn. Negative for abdominal pain, constipation, diarrhea and vomiting.  Endocrine: Negative for polydipsia and polyuria.  Genitourinary: Negative for dysuria, frequency, genital sores, vaginal bleeding and vaginal discharge.  Musculoskeletal: Positive for arthralgias, back pain and neck pain. Negative for gait problem and joint swelling.  Skin: Negative for color change and rash.  Neurological: Negative for dizziness, tremors, light-headedness and headaches.  Hematological: Negative for adenopathy. Does not bruise/bleed easily.  Psychiatric/Behavioral: Negative for dysphoric mood and sleep disturbance. The  patient is not nervous/anxious.     Patient Active Problem List   Diagnosis Date Noted  . BMI 32.0-32.9,adult 05/21/2016  . Tobacco  use disorder, moderate, in sustained remission 04/23/2016  . H/O cardiac catheterization 03/20/2015  . Coronary artery disease involving native heart with angina pectoris (HCC) 03/19/2015  . Essential (primary) hypertension 03/19/2015  . Fibrositis 03/19/2015  . Gastro-esophageal reflux disease without esophagitis 03/19/2015  . Hyperlipidemia, mixed 03/19/2015  . H/O arthrodesis 03/19/2015    Allergies  Allergen Reactions  . Azithromycin Diarrhea  . Cephalosporins   . Ibuprofen Nausea Only  . Penicillins   . Sulfa Antibiotics Photosensitivity    Past Surgical History:  Procedure Laterality Date  . ABDOMINAL HYSTERECTOMY  1986  . BACK SURGERY  08/20/2020   L4 L5 S1 nerve ablation [facility]  . CARPAL TUNNEL RELEASE Right   . CERVICAL FUSION  2015  . CESAREAN SECTION  1984  . COLONOSCOPY WITH ESOPHAGOGASTRODUODENOSCOPY ([facility])  08/31/2018   Dr. Earlean Polka  . KNEE ARTHROSCOPY Right   . PARTIAL HYSTERECTOMY    . ROTATOR CUFF REPAIR Left     Social History   Tobacco Use  . Smoking status: Former Smoker    Packs/day: 0.50    Years: 45.00    Pack years: 22.50    Types: Cigarettes    Quit date: 03/13/2012    Years since quitting: 8.7  . Smokeless tobacco: Never Used  Vaping Use  . Vaping Use: Never used  Substance Use Topics  . Alcohol use: No    Alcohol/week: 0.0 standard drinks  . Drug use: No     Medication list has been reviewed and updated.  Current Meds  Medication Sig  . acetaminophen-codeine (TYLENOL #4) 300-60 MG per tablet Take 1-2 tablets by mouth 4 (four) times daily as needed.   Ailene Ards ELLIPTA 62.5-25 MCG/INH AEPB INHALE 1 PUFF INTO THE LUNGS DAILY (Patient taking differently: as needed.)  . fluticasone (FLONASE) 50 MCG/ACT nasal spray SHAKE LIQUID AND USE 2 SPRAYS IN EACH NOSTRIL DAILY  . lidocaine (LIDODERM) 5 % Place 1 patch onto the skin as needed.   . metoprolol tartrate (LOPRESSOR) 25 MG tablet Take 25 mg by mouth 2 (two) times daily.   .  nitroGLYCERIN (NITROSTAT) 0.4 MG SL tablet Place 1 tablet under the tongue daily as needed.  Marland Kitchen omeprazole (PRILOSEC) 20 MG capsule TAKE 1 CAPSULE(20 MG) BY MOUTH TWICE DAILY BEFORE A MEAL  . rosuvastatin (CRESTOR) 40 MG tablet TK 1 T PO ONCE D.  . tiZANidine (ZANAFLEX) 4 MG tablet Take 4 mg by mouth 3 (three) times daily.   Marland Kitchen triamterene-hydrochlorothiazide (MAXZIDE-25) 37.5-25 MG tablet TAKE 1 TABLET BY MOUTH DAILY  . vitamin C (ASCORBIC ACID) 250 MG tablet Take 500 mg by mouth daily.  Monte Fantasia INHUB 100-50 MCG/DOSE AEPB INHALE 1 PUFF INTO THE LUNGS TWICE DAILY    PHQ 2/9 Scores 11/29/2020 10/25/2020 09/03/2020 11/02/2019  PHQ - 2 Score 0 0 0 0  PHQ- 9 Score 3 2 - 0    GAD 7 : Generalized Anxiety Score 10/25/2020  Nervous, Anxious, on Edge 0  Control/stop worrying 0  Worry too much - different things 0  Trouble relaxing 0  Restless 0  Easily annoyed or irritable 1  Afraid - awful might happen 0  Total GAD 7 Score 1    BP Readings from Last 3 Encounters:  11/29/20 128/80  11/02/19 112/70  08/31/19 118/85    Physical Exam Vitals and nursing note  reviewed.  Constitutional:      General: She is not in acute distress.    Appearance: She is well-developed.  HENT:     Head: Normocephalic and atraumatic.     Right Ear: Tympanic membrane and ear canal normal.     Left Ear: Tympanic membrane and ear canal normal.     Nose:     Right Sinus: No maxillary sinus tenderness.     Left Sinus: No maxillary sinus tenderness.  Eyes:     General: No scleral icterus.       Right eye: No discharge.        Left eye: No discharge.     Conjunctiva/sclera: Conjunctivae normal.  Neck:     Thyroid: No thyromegaly.     Vascular: No carotid bruit.  Cardiovascular:     Rate and Rhythm: Normal rate and regular rhythm.     Pulses: Normal pulses.     Heart sounds: Normal heart sounds.  Pulmonary:     Effort: Pulmonary effort is normal. No respiratory distress.     Breath sounds: No wheezing.   Chest:  Breasts:     Right: No mass, nipple discharge, skin change or tenderness.     Left: No mass, nipple discharge, skin change or tenderness.    Abdominal:     General: Bowel sounds are normal.     Palpations: Abdomen is soft.     Tenderness: There is no abdominal tenderness.  Musculoskeletal:     Cervical back: Normal range of motion. No erythema.     Right lower leg: No edema.     Left lower leg: No edema.  Lymphadenopathy:     Cervical: No cervical adenopathy.  Skin:    General: Skin is warm and dry.     Capillary Refill: Capillary refill takes less than 2 seconds.     Findings: No rash.  Neurological:     General: No focal deficit present.     Mental Status: She is alert and oriented to person, place, and time.     Cranial Nerves: No cranial nerve deficit.     Sensory: No sensory deficit.     Deep Tendon Reflexes: Reflexes are normal and symmetric.  Psychiatric:        Attention and Perception: Attention normal.        Mood and Affect: Mood normal.        Behavior: Behavior normal.     Wt Readings from Last 3 Encounters:  11/29/20 169 lb (76.7 kg)  11/02/19 164 lb (74.4 kg)  08/31/19 165 lb (74.8 kg)    BP 128/80   Pulse 72   Temp 98.3 F (36.8 C) (Oral)   Ht 5\' 2"  (1.575 m)   Wt 169 lb (76.7 kg)   SpO2 95%   BMI 30.91 kg/m   Assessment and Plan: 1. Annual physical exam Normal exam except for weight. Continue activity as tolerated Schedule annual Mammogram at [facility] in September.  2. Encounter for screening for osteoporosis She will follow up with Ortho for repeat studies and consideration of treatment  3. Essential (primary) hypertension Clinically stable exam with well controlled BP. Tolerating medications without side effects at this time. Pt to continue current regimen and low sodium diet; benefits of regular exercise as able discussed. - CBC with Differential/Platelet - Comprehensive metabolic panel - TSH  4. Hyperlipidemia, mixed On  Crestor - LDL at goal of < 70 - Lipid panel  5. Gastro-esophageal reflux disease without esophagitis Symptoms  fairly well controlled on daily PPI No red flag signs such as weight loss, n/v, melena but she is having some pill associated discomfort. Will continue omeprazole and she will resume crushing her medications. - CBC with Differential/Platelet  6. Coronary artery disease involving native coronary artery of native heart with angina pectoris (HCC) Stable; continue beta blocker and cardiology follow up as planned.   Partially dictated using Animal nutritionist. Any errors are unintentional.  Bari Edward, MD [facility]  11/29/2020

## 2020-11-30 LAB — CBC WITH DIFFERENTIAL/PLATELET
Basophils Absolute: 0.1 10*3/uL (ref 0.0–0.2)
Basos: 1 %
EOS (ABSOLUTE): 0.2 10*3/uL (ref 0.0–0.4)
Eos: 5 %
Hematocrit: 43.5 % (ref 34.0–46.6)
Hemoglobin: 14.3 g/dL (ref 11.1–15.9)
Immature Grans (Abs): 0 10*3/uL (ref 0.0–0.1)
Immature Granulocytes: 0 %
Lymphocytes Absolute: 2.1 10*3/uL (ref 0.7–3.1)
Lymphs: 41 %
MCH: 29.4 pg (ref 26.6–33.0)
MCHC: 32.9 g/dL (ref 31.5–35.7)
MCV: 90 fL (ref 79–97)
Monocytes Absolute: 0.6 10*3/uL (ref 0.1–0.9)
Monocytes: 13 %
Neutrophils Absolute: 2.1 10*3/uL (ref 1.4–7.0)
Neutrophils: 40 %
Platelets: 329 10*3/uL (ref 150–450)
RBC: 4.86 x10E6/uL (ref 3.77–5.28)
RDW: 12.9 % (ref 11.7–15.4)
WBC: 5.1 10*3/uL (ref 3.4–10.8)

## 2020-11-30 LAB — COMPREHENSIVE METABOLIC PANEL
ALT: 17 IU/L (ref 0–32)
AST: 18 IU/L (ref 0–40)
Albumin/Globulin Ratio: 1.7 (ref 1.2–2.2)
Albumin: 4.6 g/dL (ref 3.8–4.8)
Alkaline Phosphatase: 65 IU/L (ref 44–121)
BUN/Creatinine Ratio: 22 (ref 12–28)
BUN: 17 mg/dL (ref 8–27)
Bilirubin Total: 0.4 mg/dL (ref 0.0–1.2)
CO2: 26 mmol/L (ref 20–29)
Calcium: 10 mg/dL (ref 8.7–10.3)
Chloride: 101 mmol/L (ref 96–106)
Creatinine, Ser: 0.76 mg/dL (ref 0.57–1.00)
GFR calc Af Amer: 93 mL/min/{1.73_m2} (ref >59)
GFR calc non Af Amer: 80 mL/min/{1.73_m2} (ref >59)
Globulin, Total: 2.7 g/dL (ref 1.5–4.5)
Glucose: 93 mg/dL (ref 65–99)
Potassium: 4.5 mmol/L (ref 3.5–5.2)
Sodium: 142 mmol/L (ref 134–144)
Total Protein: 7.3 g/dL (ref 6.0–8.5)

## 2020-11-30 LAB — LIPID PANEL
Chol/HDL Ratio: 2.6 ratio (ref 0.0–4.4)
Cholesterol, Total: 151 mg/dL (ref 100–199)
HDL: 57 mg/dL (ref >39)
LDL Chol Calc (NIH): 69 mg/dL (ref 0–99)
Triglycerides: 148 mg/dL (ref 0–149)
VLDL Cholesterol Cal: 25 mg/dL (ref 5–40)

## 2020-11-30 LAB — TSH: TSH: 0.944 u[IU]/mL (ref 0.450–4.500)

## 2020-12-01 DIAGNOSIS — M79602 Pain in left arm: Secondary | ICD-10-CM | POA: Diagnosis not present

## 2020-12-01 DIAGNOSIS — M542 Cervicalgia: Secondary | ICD-10-CM | POA: Diagnosis not present

## 2020-12-04 DIAGNOSIS — M5116 Intervertebral disc disorders with radiculopathy, lumbar region: Secondary | ICD-10-CM | POA: Diagnosis not present

## 2020-12-04 DIAGNOSIS — Z79899 Other long term (current) drug therapy: Secondary | ICD-10-CM | POA: Diagnosis not present

## 2020-12-04 DIAGNOSIS — M479 Spondylosis, unspecified: Secondary | ICD-10-CM | POA: Diagnosis not present

## 2020-12-04 DIAGNOSIS — M4726 Other spondylosis with radiculopathy, lumbar region: Secondary | ICD-10-CM | POA: Diagnosis not present

## 2021-01-03 ENCOUNTER — Other Ambulatory Visit: Payer: Self-pay | Admitting: Internal Medicine

## 2021-01-03 DIAGNOSIS — R600 Localized edema: Secondary | ICD-10-CM

## 2021-01-03 NOTE — Telephone Encounter (Signed)
Requested Prescriptions  Pending Prescriptions Disp Refills  . triamterene-hydrochlorothiazide (MAXZIDE-25) 37.5-25 MG tablet [Pharmacy Med Name: TRIAMTERENE 37.5MG / HCTZ 25MG  TABS] 90 tablet 1    Sig: TAKE 1 TABLET BY MOUTH DAILY     Cardiovascular: Diuretic Combos Passed - 01/03/2021  6:27 AM      Passed - K in normal range and within 360 days    Potassium  Date Value Ref Range Status  11/29/2020 4.5 3.5 - 5.2 mmol/L Final         Passed - Na in normal range and within 360 days    Sodium  Date Value Ref Range Status  11/29/2020 142 134 - 144 mmol/L Final         Passed - Cr in normal range and within 360 days    Creatinine, Ser  Date Value Ref Range Status  11/29/2020 0.76 0.57 - 1.00 mg/dL Final         Passed - Ca in normal range and within 360 days    Calcium  Date Value Ref Range Status  11/29/2020 10.0 8.7 - 10.3 mg/dL Final         Passed - Last BP in normal range    BP Readings from Last 1 Encounters:  11/29/20 128/80         Passed - Valid encounter within last 6 months    Recent Outpatient Visits          1 month ago Annual physical exam   [facility], [place]   2 months ago Acute non-recurrent frontal sinusitis   [facility], MD   7 months ago Laryngitis   [facility], MD   1 year ago Annual physical exam   [facility], MD   1 year ago Viral URI   [facility], MD      Future Appointments            In 11 months Reubin Milan [name], MD [facility]

## 2021-02-06 DIAGNOSIS — E7800 Pure hypercholesterolemia, unspecified: Secondary | ICD-10-CM | POA: Diagnosis not present

## 2021-02-06 DIAGNOSIS — E782 Mixed hyperlipidemia: Secondary | ICD-10-CM | POA: Diagnosis not present

## 2021-02-06 DIAGNOSIS — I2119 ST elevation (STEMI) myocardial infarction involving other coronary artery of inferior wall: Secondary | ICD-10-CM | POA: Diagnosis not present

## 2021-02-06 DIAGNOSIS — R079 Chest pain, unspecified: Secondary | ICD-10-CM | POA: Diagnosis not present

## 2021-02-22 ENCOUNTER — Telehealth: Payer: Self-pay | Admitting: Internal Medicine

## 2021-02-22 ENCOUNTER — Other Ambulatory Visit: Payer: Self-pay | Admitting: Internal Medicine

## 2021-02-22 DIAGNOSIS — R600 Localized edema: Secondary | ICD-10-CM

## 2021-02-22 MED ORDER — TRIAMTERENE-HCTZ 37.5-25 MG PO TABS: 1.0000 | ORAL_TABLET | Freq: Every day | ORAL | 1 refills | Status: DC

## 2021-02-22 MED ORDER — OMEPRAZOLE 20 MG PO CPDR: DELAYED_RELEASE_CAPSULE | ORAL | 1 refills | Status: DC

## 2021-02-22 NOTE — Telephone Encounter (Signed)
Copied from CRM [id number]. Topic: Quick Communication - Rx Refill/Question >> Feb 22, 2021 12:14 PM Jordan Baxter, Jordan Baxter wrote: Medication: omeprazole (PRILOSEC) 20 MG capsule   triamterene-hydrochlorothiazide (MAXZIDE-25) 37.5-25 MG tablet   Has the patient contacted their pharmacy? Patient's pharmacy has made contact on their behalf. This will be patient's first time using this pharmacy.  Preferred Pharmacy (with phone number or street name): [facility] - [place], [place] - [address]  Phone:  [phone] Fax:  [phone]  Agent: Please be advised that RX refills may take up to 3 business days. We ask that you follow-up with your pharmacy.

## 2021-02-22 NOTE — Telephone Encounter (Signed)
Future visit in 9 months  

## 2021-04-09 DIAGNOSIS — M5116 Intervertebral disc disorders with radiculopathy, lumbar region: Secondary | ICD-10-CM | POA: Diagnosis not present

## 2021-04-09 DIAGNOSIS — M479 Spondylosis, unspecified: Secondary | ICD-10-CM | POA: Diagnosis not present

## 2021-04-09 DIAGNOSIS — M4726 Other spondylosis with radiculopathy, lumbar region: Secondary | ICD-10-CM | POA: Diagnosis not present

## 2021-04-09 DIAGNOSIS — Z79891 Long term (current) use of opiate analgesic: Secondary | ICD-10-CM | POA: Diagnosis not present

## 2021-04-09 DIAGNOSIS — Z5181 Encounter for therapeutic drug level monitoring: Secondary | ICD-10-CM | POA: Diagnosis not present

## 2021-04-09 DIAGNOSIS — Z79899 Other long term (current) drug therapy: Secondary | ICD-10-CM | POA: Diagnosis not present

## 2021-04-11 DIAGNOSIS — M47812 Spondylosis without myelopathy or radiculopathy, cervical region: Secondary | ICD-10-CM | POA: Diagnosis not present

## 2021-04-11 DIAGNOSIS — Z981 Arthrodesis status: Secondary | ICD-10-CM | POA: Diagnosis not present

## 2021-05-02 DIAGNOSIS — M47812 Spondylosis without myelopathy or radiculopathy, cervical region: Secondary | ICD-10-CM | POA: Diagnosis not present

## 2021-06-11 DIAGNOSIS — M25572 Pain in left ankle and joints of left foot: Secondary | ICD-10-CM | POA: Diagnosis not present

## 2021-06-11 DIAGNOSIS — S76012A Strain of muscle, fascia and tendon of left hip, initial encounter: Secondary | ICD-10-CM | POA: Diagnosis not present

## 2021-07-09 DIAGNOSIS — Z5181 Encounter for therapeutic drug level monitoring: Secondary | ICD-10-CM | POA: Diagnosis not present

## 2021-07-09 DIAGNOSIS — Z79899 Other long term (current) drug therapy: Secondary | ICD-10-CM | POA: Diagnosis not present

## 2021-07-09 DIAGNOSIS — M47817 Spondylosis without myelopathy or radiculopathy, lumbosacral region: Secondary | ICD-10-CM | POA: Diagnosis not present

## 2021-07-09 DIAGNOSIS — M4726 Other spondylosis with radiculopathy, lumbar region: Secondary | ICD-10-CM | POA: Diagnosis not present

## 2021-07-09 DIAGNOSIS — M7989 Other specified soft tissue disorders: Secondary | ICD-10-CM | POA: Diagnosis not present

## 2021-07-09 DIAGNOSIS — M47812 Spondylosis without myelopathy or radiculopathy, cervical region: Secondary | ICD-10-CM | POA: Diagnosis not present

## 2021-07-09 DIAGNOSIS — R2242 Localized swelling, mass and lump, left lower limb: Secondary | ICD-10-CM | POA: Diagnosis not present

## 2021-07-09 DIAGNOSIS — Z79891 Long term (current) use of opiate analgesic: Secondary | ICD-10-CM | POA: Diagnosis not present

## 2021-07-09 DIAGNOSIS — M479 Spondylosis, unspecified: Secondary | ICD-10-CM | POA: Diagnosis not present

## 2021-07-09 DIAGNOSIS — M25552 Pain in left hip: Secondary | ICD-10-CM | POA: Diagnosis not present

## 2021-07-09 DIAGNOSIS — M5116 Intervertebral disc disorders with radiculopathy, lumbar region: Secondary | ICD-10-CM | POA: Diagnosis not present

## 2021-07-15 DIAGNOSIS — M47812 Spondylosis without myelopathy or radiculopathy, cervical region: Secondary | ICD-10-CM | POA: Diagnosis not present

## 2021-07-15 DIAGNOSIS — I10 Essential (primary) hypertension: Secondary | ICD-10-CM | POA: Diagnosis not present

## 2021-07-16 DIAGNOSIS — M25552 Pain in left hip: Secondary | ICD-10-CM | POA: Diagnosis not present

## 2021-07-24 ENCOUNTER — Other Ambulatory Visit: Payer: Self-pay | Admitting: Internal Medicine

## 2021-07-24 DIAGNOSIS — R600 Localized edema: Secondary | ICD-10-CM

## 2021-07-24 NOTE — Telephone Encounter (Signed)
Requested Prescriptions  Pending Prescriptions Disp Refills  . triamterene-hydrochlorothiazide (MAXZIDE-25) 37.5-25 MG tablet [Pharmacy Med Name: TRIAMTERENE 37.5MG / HCTZ 25MG  TABS] 90 tablet 0    Sig: TAKE 1 TABLET BY MOUTH DAILY     Cardiovascular: Diuretic Combos Failed - 07/24/2021  6:29 AM      Failed - Valid encounter within last 6 months    Recent Outpatient Visits          7 months ago Annual physical exam   [facility], MD   9 months ago Acute non-recurrent frontal sinusitis   [facility], MD   1 year ago Laryngitis   [facility], MD   1 year ago Annual physical exam   [facility], MD   1 year ago Viral URI   [facility], MD      Future Appointments            In 4 months Reubin Milan, MD [facility]           Passed - K in normal range and within 360 days    Potassium  Date Value Ref Range Status  11/29/2020 4.5 3.5 - 5.2 mmol/L Final         Passed - Na in normal range and within 360 days    Sodium  Date Value Ref Range Status  11/29/2020 142 134 - 144 mmol/L Final         Passed - Cr in normal range and within 360 days    Creatinine, Ser  Date Value Ref Range Status  11/29/2020 0.76 0.57 - 1.00 mg/dL Final         Passed - Ca in normal range and within 360 days    Calcium  Date Value Ref Range Status  11/29/2020 10.0 8.7 - 10.3 mg/dL Final         Passed - Last BP in normal range    BP Readings from Last 1 Encounters:  11/29/20 128/80         . omeprazole (PRILOSEC) 20 MG capsule [Pharmacy Med Name: OMEPRAZOLE 20MG  CAPSULES] 180 capsule 0    Sig: TAKE 1 CAPSULE(20 MG) BY MOUTH TWICE DAILY BEFORE A MEAL     Gastroenterology: Proton Pump Inhibitors Passed - 07/24/2021  6:29 AM      Passed - Valid encounter within last 12 months    Recent Outpatient Visits          7 months ago Annual  physical exam   [facility] 09/23/2021, MD   9 months ago Acute non-recurrent frontal sinusitis   [facility] Reubin Milan, MD   1 year ago Laryngitis   [facility] Reubin Milan, MD   1 year ago Annual physical exam   [facility] Reubin Milan, MD   1 year ago Viral URI   [facility] Reubin Milan, MD      Future Appointments            In 4 months [facility] Reubin Milan, MD [facility]

## 2021-07-25 ENCOUNTER — Other Ambulatory Visit: Payer: Self-pay | Admitting: Internal Medicine

## 2021-07-25 DIAGNOSIS — R600 Localized edema: Secondary | ICD-10-CM

## 2021-07-25 NOTE — Telephone Encounter (Signed)
[facility] called and spoke to [place], Youth worker about the refill(s) omeprazole 20mg  and maxzide 37.5-25mg   requested. Advised it was sent on 07/24/21. Medications were sent to [facility] instead of [facility]. Advised will resend refill requests.   Requested Prescriptions  Pending Prescriptions Disp Refills   omeprazole (PRILOSEC) 20 MG capsule [Pharmacy Med Name: OMEPRAZOLE 20 MG Capsule Delayed Release] 180 capsule 0    Sig: TAKE 1 CAPSULE TWICE DAILY BEFORE MEALS     Gastroenterology: Proton Pump Inhibitors Passed - 07/25/2021  3:39 AM      Passed - Valid encounter within last 12 months    Recent Outpatient Visits           7 months ago Annual physical exam   [facility], [place]   9 months ago Acute non-recurrent frontal sinusitis   [facility], [place]   1 year ago Laryngitis   [facility], MD   1 year ago Annual physical exam   [facility], MD   1 year ago Viral URI   [facility], MD       Future Appointments             In 4 months Reubin Milan [name], MD [facility]             triamterene-hydrochlorothiazide (MAXZIDE-25) 37.5-25 MG tablet [Pharmacy Med Name: TRIAMTERENE/HYDROCHLOROTHIAZIDE 37.5-25 MG Tablet] 90 tablet 0    Sig: TAKE 1 TABLET EVERY DAY     Cardiovascular: Diuretic Combos Failed - 07/25/2021  3:39 AM      Failed - Valid encounter within last 6 months    Recent Outpatient Visits           7 months ago Annual physical exam   [facility], MD   9 months ago Acute non-recurrent frontal sinusitis   [facility], MD   1 year ago Laryngitis   [facility], MD   1 year ago Annual physical exam   [facility], MD   1 year ago Viral URI   [facility], MD        Future Appointments             In 4 months Reubin Milan, MD [facility]            Passed - K in normal range and within 360 days    Potassium  Date Value Ref Range Status  11/29/2020 4.5 3.5 - 5.2 mmol/L Final          Passed - Na in normal range and within 360 days    Sodium  Date Value Ref Range Status  11/29/2020 142 134 - 144 mmol/L Final          Passed - Cr in normal range and within 360 days    Creatinine, Ser  Date Value Ref Range Status  11/29/2020 0.76 0.57 - 1.00 mg/dL Final          Passed - Ca in normal range and within 360 days    Calcium  Date Value Ref Range Status  11/29/2020 10.0 8.7 - 10.3 mg/dL Final          Passed - Last BP in normal range    BP Readings from Last 1 Encounters:  11/29/20 128/80            

## 2021-08-23 DIAGNOSIS — M7062 Trochanteric bursitis, left hip: Secondary | ICD-10-CM | POA: Diagnosis not present

## 2021-09-09 ENCOUNTER — Ambulatory Visit (INDEPENDENT_AMBULATORY_CARE_PROVIDER_SITE_OTHER): Payer: Medicare PPO

## 2021-09-09 DIAGNOSIS — Z0000 Encounter for general adult medical examination without abnormal findings: Secondary | ICD-10-CM | POA: Diagnosis not present

## 2021-09-09 NOTE — Patient Instructions (Signed)
[name] , Thank you for taking time to come for your Medicare Wellness Visit. I appreciate your ongoing commitment to your health goals. Please review the following plan we discussed and let me know if I can assist you in the future.   Screening recommendations/referrals: Colonoscopy: done 01/03/20 Mammogram: done 07/12/20. Please schedule with [facility] Bone Density: done 07/01/16 Recommended yearly ophthalmology/optometry visit for glaucoma screening and checkup Recommended yearly dental visit for hygiene and checkup  Vaccinations: Influenza vaccine: declined Pneumococcal vaccine: done 07/30/17 Tdap vaccine: done 05/03/14 Shingles vaccine: done 11/03/17 & 01/01/18   Covid-19:declined  Advanced directives: Please bring a copy of your health care power of attorney and living will to the office at your convenience.   Conditions/risks identified: Recommend drinking 6-8 glasses of water per day   Next appointment: Follow up in one year for your annual wellness visit    Preventive Care 65 Years and Older, Female Preventive care refers to lifestyle choices and visits with your health care provider that can promote health and wellness. What does preventive care include? A yearly physical exam. This is also called an annual well check. Dental exams once or twice a year. Routine eye exams. Ask your health care provider how often you should have your eyes checked. Personal lifestyle choices, including: Daily care of your teeth and gums. Regular physical activity. Eating a healthy diet. Avoiding tobacco and drug use. Limiting alcohol use. Practicing safe sex. Taking low-dose aspirin every day. Taking vitamin and mineral supplements as recommended by your health care provider. What happens during an annual well check? The services and screenings done by your health care provider during your annual well check will depend on your age, overall health, lifestyle risk factors,  and family history of disease. Counseling  Your health care provider may ask you questions about your: Alcohol use. Tobacco use. Drug use. Emotional well-being. Home and relationship well-being. Sexual activity. Eating habits. History of falls. Memory and ability to understand (cognition). Work and work Astronomer. Reproductive health. Screening  You may have the following tests or measurements: Height, weight, and BMI. Blood pressure. Lipid and cholesterol levels. These may be checked every 5 years, or more frequently if you are over 70 years old. Skin check. Lung cancer screening. You may have this screening every year starting at age 70 if you have a 30-pack-year history of smoking and currently smoke or have quit within the past 15 years. Fecal occult blood test (FOBT) of the stool. You may have this test every year starting at age 70. Flexible sigmoidoscopy or colonoscopy. You may have a sigmoidoscopy every 5 years or a colonoscopy every 10 years starting at age 70. Hepatitis C blood test. Hepatitis B blood test. Sexually transmitted disease (STD) testing. Diabetes screening. This is done by checking your blood sugar (glucose) after you have not eaten for a while (fasting). You may have this done every 1-3 years. Bone density scan. This is done to screen for osteoporosis. You may have this done starting at age 70. Mammogram. This may be done every 1-2 years. Talk to your health care provider about how often you should have regular mammograms. Talk with your health care provider about your test results, treatment options, and if necessary, the need for more tests. Vaccines  Your health care provider may recommend certain vaccines, such as: Influenza vaccine. This is recommended every year. Tetanus, diphtheria, and acellular pertussis (Tdap, Td) vaccine. You may need a Td booster every 10 years. Zoster vaccine.  You may need this after age 70. Pneumococcal 13-valent conjugate  (PCV13) vaccine. One dose is recommended after age 70. Pneumococcal polysaccharide (PPSV23) vaccine. One dose is recommended after age 70. Talk to your health care provider about which screenings and vaccines you need and how often you need them. This information is not intended to replace advice given to you by your health care provider. Make sure you discuss any questions you have with your health care provider. Document Released: 10/26/2015 Document Revised: 06/18/2016 Document Reviewed: 07/31/2015 Elsevier Interactive Patient Education  2017 [place] Prevention in the Home Falls can cause injuries. They can happen to people of all ages. There are many things you can do to make your home safe and to help prevent falls. What can I do on the outside of my home? Regularly fix the edges of walkways and driveways and fix any cracks. Remove anything that might make you trip as you walk through a door, such as a raised step or threshold. Trim any bushes or trees on the path to your home. Use bright outdoor lighting. Clear any walking paths of anything that might make someone trip, such as rocks or tools. Regularly check to see if handrails are loose or broken. Make sure that both sides of any steps have handrails. Any raised decks and porches should have guardrails on the edges. Have any leaves, snow, or ice cleared regularly. Use sand or salt on walking paths during winter. Clean up any spills in your garage right away. This includes oil or grease spills. What can I do in the bathroom? Use night lights. Install grab bars by the toilet and in the tub and shower. Do not use towel bars as grab bars. Use non-skid mats or decals in the tub or shower. If you need to sit down in the shower, use a plastic, non-slip stool. Keep the floor dry. Clean up any water that spills on the floor as soon as it happens. Remove soap buildup in the tub or shower regularly. Attach bath mats securely with  double-sided non-slip rug tape. Do not have throw rugs and other things on the floor that can make you trip. What can I do in the bedroom? Use night lights. Make sure that you have a light by your bed that is easy to reach. Do not use any sheets or blankets that are too big for your bed. They should not hang down onto the floor. Have a firm chair that has side arms. You can use this for support while you get dressed. Do not have throw rugs and other things on the floor that can make you trip. What can I do in the kitchen? Clean up any spills right away. Avoid walking on wet floors. Keep items that you use a lot in easy-to-reach places. If you need to reach something above you, use a strong step stool that has a grab bar. Keep electrical cords out of the way. Do not use floor polish or wax that makes floors slippery. If you must use wax, use non-skid floor wax. Do not have throw rugs and other things on the floor that can make you trip. What can I do with my stairs? Do not leave any items on the stairs. Make sure that there are handrails on both sides of the stairs and use them. Fix handrails that are broken or loose. Make sure that handrails are as long as the stairways. Check any carpeting to make sure that it is  firmly attached to the stairs. Fix any carpet that is loose or worn. Avoid having throw rugs at the top or bottom of the stairs. If you do have throw rugs, attach them to the floor with carpet tape. Make sure that you have a light switch at the top of the stairs and the bottom of the stairs. If you do not have them, ask someone to add them for you. What else can I do to help prevent falls? Wear shoes that: Do not have high heels. Have rubber bottoms. Are comfortable and fit you well. Are closed at the toe. Do not wear sandals. If you use a stepladder: Make sure that it is fully opened. Do not climb a closed stepladder. Make sure that both sides of the stepladder are locked  into place. Ask someone to hold it for you, if possible. Clearly mark and make sure that you can see: Any grab bars or handrails. First and last steps. Where the edge of each step is. Use tools that help you move around (mobility aids) if they are needed. These include: Canes. Walkers. Scooters. Crutches. Turn on the lights when you go into a dark area. Replace any light bulbs as soon as they burn out. Set up your furniture so you have a clear path. Avoid moving your furniture around. If any of your floors are uneven, fix them. If there are any pets around you, be aware of where they are. Review your medicines with your doctor. Some medicines can make you feel dizzy. This can increase your chance of falling. Ask your doctor what other things that you can do to help prevent falls. This information is not intended to replace advice given to you by your health care provider. Make sure you discuss any questions you have with your health care provider. Document Released: 07/26/2009 Document Revised: 03/06/2016 Document Reviewed: 11/03/2014 Elsevier Interactive Patient Education  2017 [employer].

## 2021-09-09 NOTE — Progress Notes (Signed)
Subjective:   Jordan Baxter is a 70 y.o. female who presents for Medicare Annual (Subsequent) preventive examination.  Virtual Visit via Telephone Note  I connected with  Jordan Baxter on 09/09/21 at  8:00 AM EST by telephone and verified that I am speaking with the correct person using two identifiers.  Location: Patient: home Provider: [facility] Persons participating in the virtual visit: patient/Nurse Health Advisor   I discussed the limitations, risks, security and privacy concerns of performing an evaluation and management service by telephone and the availability of in person appointments. The patient expressed understanding and agreed to proceed.  Interactive audio and video telecommunications were attempted between this nurse and patient, however failed, due to patient having technical difficulties OR patient did not have access to video capability.  We continued and completed visit with audio only.  Some vital signs may be absent or patient reported.   Reather Littler, LPN    Review of Systems     Cardiac Risk Factors include: advanced age (>67men, >44 women);dyslipidemia;hypertension     Objective:    Today's Vitals   09/09/21 0800  PainSc: 4    There is no height or weight on file to calculate BMI.  Advanced Directives 09/09/2021 09/03/2020 08/31/2019 07/30/2017  Does Patient Have a Medical Advance Directive? Yes Yes Yes Yes  Type of Estate agent of [place];Living will Healthcare Power of [place];Living will Healthcare Power of [place];Living will Healthcare Power of [place];Living will  Copy of Healthcare Power of Attorney in Chart? No - copy requested No - copy requested No - copy requested No - copy requested    Current Medications (verified) Outpatient Encounter Medications as of 09/09/2021  Medication Sig   acetaminophen-codeine (TYLENOL #4) 300-60 MG per tablet Take 1-2 tablets by mouth 4 (four) times daily as needed.    ANORO ELLIPTA  62.5-25 MCG/INH AEPB INHALE 1 PUFF INTO THE LUNGS DAILY (Patient taking differently: as needed.)   ASHWAGANDHA PO Take by mouth. Gummy supplement   fluticasone (FLONASE) 50 MCG/ACT nasal spray SHAKE LIQUID AND USE 2 SPRAYS IN EACH NOSTRIL DAILY   metoprolol tartrate (LOPRESSOR) 25 MG tablet Take 25 mg by mouth 2 (two) times daily.    nitroGLYCERIN (NITROSTAT) 0.4 MG SL tablet Place 1 tablet under the tongue daily as needed.   omeprazole (PRILOSEC) 20 MG capsule TAKE 1 CAPSULE TWICE DAILY BEFORE MEALS   rosuvastatin (CRESTOR) 40 MG tablet Take 1 tablet by mouth daily.   tiZANidine (ZANAFLEX) 4 MG tablet Take 4 mg by mouth 3 (three) times daily.    triamterene-hydrochlorothiazide (MAXZIDE-25) 37.5-25 MG tablet TAKE 1 TABLET EVERY DAY   WIXELA INHUB 100-50 MCG/DOSE AEPB INHALE 1 PUFF INTO THE LUNGS TWICE DAILY   [DISCONTINUED] lidocaine (LIDODERM) 5 % Place 1 patch onto the skin as needed.    [DISCONTINUED] rosuvastatin (CRESTOR) 40 MG tablet TK 1 T PO ONCE D.   [DISCONTINUED] vitamin C (ASCORBIC ACID) 250 MG tablet Take 500 mg by mouth daily.   No facility-administered encounter medications on file as of 09/09/2021.    Allergies (verified) Azithromycin, Cephalosporins, Ibuprofen, Nsaids, Penicillins, and Sulfa antibiotics   History: Past Medical History:  Diagnosis Date   Allergy    Arthritis    Fibrositis    GERD (gastroesophageal reflux disease)    Hyperlipidemia    Hypertension    Past Surgical History:  Procedure Laterality Date   ABDOMINAL HYSTERECTOMY  1986   BACK SURGERY  08/20/2020   L4 L5 S1 nerve ablation  [facility]   CARPAL TUNNEL RELEASE Right    CERVICAL FUSION  2015   CESAREAN SECTION  1984   COLONOSCOPY WITH ESOPHAGOGASTRODUODENOSCOPY (EGD)  08/31/2018   Dr. Earlean Polka   KNEE ARTHROSCOPY Right    PARTIAL HYSTERECTOMY     ROTATOR CUFF REPAIR Left    Family History  Problem Relation Age of Onset   CAD Mother    Heart disease Mother    Cancer Father    Heart  disease Sister    Heart attack Sister    Heart disease Brother    Heart attack Sister    Arthritis Paternal Grandmother    Social History   Socioeconomic History   Marital status: Divorced    Spouse name: Not on file   Number of children: 1   Years of education: some college   Highest education level: Not on file  Occupational History   Occupation: retired  Tobacco Use   Smoking status: Former    Packs/day: 0.50    Years: 45.00    Pack years: 22.50    Types: Cigarettes    Quit date: 03/13/2012    Years since quitting: 9.4   Smokeless tobacco: Never  Vaping Use   Vaping Use: Never used  Substance and Sexual Activity   Alcohol use: No    Alcohol/week: 0.0 standard drinks   Drug use: No   Sexual activity: Never  Other Topics Concern   Not on file  Social History Narrative   Pt lives alone   Social Determinants of Health   Financial Resource Strain: Low Risk    Difficulty of Paying Living Expenses: Not hard at all  Food Insecurity: No Food Insecurity   Worried About Programme researcher, broadcasting/film/video in the Last Year: Never true   Ran Out of Food in the Last Year: Never true  Transportation Needs: No Transportation Needs   Lack of Transportation (Medical): No   Lack of Transportation (Non-Medical): No  Physical Activity: Inactive   Days of Exercise per Week: 0 days   Minutes of Exercise per Session: 0 min  Stress: No Stress Concern Present   Feeling of Stress : Not at all  Social Connections: Moderately Integrated   Frequency of Communication with Friends and Family: More than three times a week   Frequency of Social Gatherings with Friends and Family: More than three times a week   Attends Religious Services: More than 4 times per year   Active Member of [employer] or Organizations: Yes   Attends Engineer, structural: More than 4 times per year   Marital Status: Divorced    Tobacco Counseling Counseling given: Not Answered   Clinical Intake:  Pre-visit preparation  completed: Yes  Pain : 0-10 Pain Score: 4  Pain Type: Chronic pain Pain Location: Shoulder (also left hip and neck) Pain Orientation: Right Pain Descriptors / Indicators: Aching, Sore Pain Onset: More than a month ago Pain Frequency: Constant     Nutritional Risks: None Diabetes: No  How often do you need to have someone help you when you read instructions, pamphlets, or other written materials from your doctor or pharmacy?: 1 - Never   Interpreter Needed?: No  Information entered by :: Reather Littler LPN   Activities of Daily Living In your present state of health, do you have any difficulty performing the following activities: 09/09/2021  Hearing? N  Vision? N  Difficulty concentrating or making decisions? N  Walking or climbing stairs? N  Dressing or bathing?  N  Doing errands, shopping? N  Preparing Food and eating ? N  Using the Toilet? N  In the past six months, have you accidently leaked urine? N  Do you have problems with loss of bowel control? N  Managing your Medications? N  Managing your Finances? N  Housekeeping or managing your Housekeeping? N  Some recent data might be hidden    Patient Care Team: Reubin Milan, MD as PCP - General (Internal Medicine) Loren Racer, MD as Referring Physician (Physical Medicine and Rehabilitation) Laretta Bolster, MD as Referring Physician (Cardiology)  Indicate any recent Medical Services you may have received from other than [facility] providers in the past year (date may be approximate).     Assessment:   This is a routine wellness examination for Yehudis.  Hearing/Vision screen Hearing Screening - Comments:: Pt denies hearing difficulty Vision Screening - Comments:: Annual vision screenings at [facility] in [place]; due for exam  Dietary issues and exercise activities discussed: Current Exercise Habits: The patient does not participate in regular exercise at present, Exercise limited by: orthopedic  condition(s)   Goals Addressed             This Visit's Progress    DIET - INCREASE WATER INTAKE       Recommend drinking 6-8 glasses of water per day        Depression Screen PHQ 2/9 Scores 09/09/2021 11/29/2020 10/25/2020 09/03/2020 11/02/2019 08/31/2019 08/31/2019  PHQ - 2 Score 0 0 0 0 0 0 1  PHQ- 9 Score - 3 2 - 0 0 4    Fall Risk Fall Risk  09/09/2021 11/29/2020 10/25/2020 09/03/2020 08/31/2019  Falls in the past year? 1 1 1  0 0  Number falls in past yr: 0 0 0 0 0  Injury with Fall? 0 0 0 0 0  Risk for fall due to : Orthopedic patient - - Orthopedic patient -  Follow up Falls prevention discussed Falls evaluation completed Falls evaluation completed Falls prevention discussed Falls prevention discussed    FALL RISK PREVENTION PERTAINING TO THE HOME:  Any stairs in or around the home? Yes  If so, are there any without handrails? No  Home free of loose throw rugs in walkways, pet beds, electrical cords, etc? Yes  Adequate lighting in your home to reduce risk of falls? Yes   ASSISTIVE DEVICES UTILIZED TO PREVENT FALLS:  Life alert? No  Use of a cane, walker or w/c? No  Grab bars in the bathroom? Yes  Shower chair or bench in shower? No  Elevated toilet seat or a handicapped toilet? Yes   TIMED UP AND GO:  Was the test performed? No . Telephonic visit   Cognitive Function: Normal cognitive status assessed by direct observation by this Nurse Health Advisor. No abnormalities found.       6CIT Screen 08/31/2019 08/25/2018 07/30/2017 08/22/2016  What Year? 0 points 0 points 0 points 0 points  What month? 0 points 0 points 0 points 0 points  What time? 0 points 0 points 0 points 0 points  Count back from 20 0 points 0 points 0 points 0 points  Months in reverse 0 points 0 points 0 points 0 points  Repeat phrase 2 points 0 points 0 points 0 points  Total Score 2 0 0 0    Immunizations Immunization History  Administered Date(s) Administered   Fluad Quad(high  Dose 65+) 08/10/2019   Influenza Split 10/04/2009   Influenza, High Dose Seasonal PF  08/25/2018   Influenza,inj,Quad PF,6+ Mos 07/30/2017   Influenza-Unspecified 07/22/2015   Pneumococcal Conjugate-13 08/22/2016   Pneumococcal Polysaccharide-23 11/13/2009, 07/30/2017   Tdap 05/03/2014   Zoster Recombinat (Shingrix) 11/03/2017, 01/01/2018   Zoster, Live 10/15/2011    TDAP status: Up to date  Flu Vaccine status: Declined, Education has been provided regarding the importance of this vaccine but patient still declined. Advised may receive this vaccine at local pharmacy or Health Dept. Aware to provide a copy of the vaccination record if obtained from local pharmacy or Health Dept. Verbalized acceptance and understanding.  Pneumococcal vaccine status: Up to date  Covid-19 vaccine status: Declined, Education has been provided regarding the importance of this vaccine but patient still declined. Advised may receive this vaccine at local pharmacy or Health Dept.or vaccine clinic. Aware to provide a copy of the vaccination record if obtained from local pharmacy or Health Dept. Verbalized acceptance and understanding.  Qualifies for Shingles Vaccine? Yes   Zostavax completed Yes   Shingrix Completed?: Yes  Screening Tests Health Maintenance  Topic Date Due   COVID-19 Vaccine (1) Never done   INFLUENZA VACCINE  05/13/2021   MAMMOGRAM  07/12/2021   TETANUS/TDAP  05/03/2024   COLONOSCOPY (Pts 45-55yrs Insurance coverage will need to be confirmed)  01/02/2030   Pneumonia Vaccine 36+ Years old  Completed   DEXA SCAN  Completed   Hepatitis C Screening  Completed   Zoster Vaccines- Shingrix  Completed   HPV VACCINES  Aged Out    Health Maintenance  Health Maintenance Due  Topic Date Due   COVID-19 Vaccine (1) Never done   INFLUENZA VACCINE  05/13/2021   MAMMOGRAM  07/12/2021    Colorectal cancer screening: Type of screening: Colonoscopy. Completed 01/03/20. Repeat every 10  years  Mammogram status: Completed 07/12/20. Repeat every year  Bone Density status: Completed 07/01/16. Results reflect: Bone density results: OSTEOPENIA. Repeat every 2 years. Pt plans to discuss with [facility] tomorrow.   Lung Cancer Screening: (Low Dose CT Chest recommended if Age 44-80 years, 30 pack-year currently smoking OR have quit w/in 15years.) does not qualify.   Additional Screening:  Hepatitis C Screening: does qualify; Completed 02/19/17  Vision Screening: Recommended annual ophthalmology exams for early detection of glaucoma and other disorders of the eye. Is the patient up to date with their annual eye exam?  No  Who is the provider or what is the name of the office in which the patient attends annual eye exams? [facility].   Dental Screening: Recommended annual dental exams for proper oral hygiene  Community Resource Referral / Chronic Care Management: CRR required this visit?  No   CCM required this visit?  No      Plan:     I have personally reviewed and noted the following in the patient's chart:   Medical and social history Use of alcohol, tobacco or illicit drugs  Current medications and supplements including opioid prescriptions.  Functional ability and status Nutritional status Physical activity Advanced directives List of other physicians Hospitalizations, surgeries, and ER visits in previous 12 months Vitals Screenings to include cognitive, depression, and falls Referrals and appointments  In addition, I have reviewed and discussed with patient certain preventive protocols, quality metrics, and best practice recommendations. A written personalized care plan for preventive services as well as general preventive health recommendations were provided to patient.     Reather Littler, LPN   71/03/[id number]   Nurse Notes: none

## 2021-09-10 DIAGNOSIS — M479 Spondylosis, unspecified: Secondary | ICD-10-CM | POA: Diagnosis not present

## 2021-09-10 DIAGNOSIS — M5136 Other intervertebral disc degeneration, lumbar region: Secondary | ICD-10-CM | POA: Diagnosis not present

## 2021-09-10 DIAGNOSIS — M25559 Pain in unspecified hip: Secondary | ICD-10-CM | POA: Diagnosis not present

## 2021-09-10 DIAGNOSIS — M25552 Pain in left hip: Secondary | ICD-10-CM | POA: Diagnosis not present

## 2021-09-10 DIAGNOSIS — M5416 Radiculopathy, lumbar region: Secondary | ICD-10-CM | POA: Diagnosis not present

## 2021-09-10 DIAGNOSIS — Z79899 Other long term (current) drug therapy: Secondary | ICD-10-CM | POA: Diagnosis not present

## 2021-09-10 DIAGNOSIS — R2242 Localized swelling, mass and lump, left lower limb: Secondary | ICD-10-CM | POA: Diagnosis not present

## 2021-09-10 DIAGNOSIS — M47817 Spondylosis without myelopathy or radiculopathy, lumbosacral region: Secondary | ICD-10-CM | POA: Diagnosis not present

## 2021-09-10 DIAGNOSIS — M47896 Other spondylosis, lumbar region: Secondary | ICD-10-CM | POA: Diagnosis not present

## 2021-09-11 ENCOUNTER — Encounter: Payer: Self-pay | Admitting: Internal Medicine

## 2021-09-11 ENCOUNTER — Ambulatory Visit: Payer: Medicare PPO | Admitting: Internal Medicine

## 2021-09-11 ENCOUNTER — Other Ambulatory Visit: Payer: Self-pay

## 2021-09-11 VITALS — BP 138/82 | HR 73 | Temp 98.5°F | Ht 62.0 in | Wt 168.0 lb

## 2021-09-11 DIAGNOSIS — M797 Fibromyalgia: Secondary | ICD-10-CM

## 2021-09-11 DIAGNOSIS — M5412 Radiculopathy, cervical region: Secondary | ICD-10-CM | POA: Insufficient documentation

## 2021-09-11 DIAGNOSIS — M7062 Trochanteric bursitis, left hip: Secondary | ICD-10-CM | POA: Diagnosis not present

## 2021-09-11 DIAGNOSIS — G8929 Other chronic pain: Secondary | ICD-10-CM

## 2021-09-11 DIAGNOSIS — I10 Essential (primary) hypertension: Secondary | ICD-10-CM | POA: Diagnosis not present

## 2021-09-11 DIAGNOSIS — M25511 Pain in right shoulder: Secondary | ICD-10-CM

## 2021-09-11 NOTE — Progress Notes (Signed)
Date:  09/11/2021   Name:  Jordan Baxter   DOB:  09/23/1951   MRN:  [id number]   Chief Complaint: Medication Problem ( pain clinic advised patient urine is not clean thererfore will not prescribe any medication, patient will bring urine results but wants to follow up with PCP )  Fibromyalgia/fibrositis - has been seeing [facility] pain clinic but recent UDS suggested that she was taking additional narcotics so she was taken off of Tylenol #4.  She was given enough to taper off along with Gabapentin and Clonidine patch.  It is unclear to me that she actually has fibromyalgia - she has several other issues that cause most of her sx - neck and back pain mostly; recent hip pain/bursitis and now worsening right shoulder pain.  She has had lumbar and cervical fusions plus Left shoulder rotator cuff repair.  Shoulder Pain  The pain is present in the right shoulder. This is a chronic problem. The current episode started 1 to 4 weeks ago. The problem has been gradually worsening. The quality of the pain is described as aching and burning. The pain is at a severity of 3/10. Associated symptoms include an inability to bear weight and a limited range of motion. Pertinent negatives include no fever. The symptoms are aggravated by activity.  Neck Pain  This is a chronic problem. Associated with: hx of cervical fusion. The pain is present in the midline. The quality of the pain is described as burning and shooting. The pain is moderate. Associated symptoms include headaches. Pertinent negatives include no chest pain or fever.  Hip Pain  The incident occurred more than 1 week ago. The incident occurred at home. The injury mechanism is unknown. Associated symptoms include an inability to bear weight. Treatments tried: recent steroid injection at [facility].   Lab Results  Component Value Date   NA 142 11/29/2020   K 4.5 11/29/2020   CO2 26 11/29/2020   GLUCOSE 93 11/29/2020   BUN 17 11/29/2020    CREATININE 0.76 11/29/2020   CALCIUM 10.0 11/29/2020   GFRNONAA 80 11/29/2020   Lab Results  Component Value Date   CHOL 151 11/29/2020   HDL 57 11/29/2020   LDLCALC 69 11/29/2020   TRIG 148 11/29/2020   CHOLHDL 2.6 11/29/2020   Lab Results  Component Value Date   TSH 0.944 11/29/2020   No results found for: HGBA1C Lab Results  Component Value Date   WBC 5.1 11/29/2020   HGB 14.3 11/29/2020   HCT 43.5 11/29/2020   MCV 90 11/29/2020   PLT 329 11/29/2020   Lab Results  Component Value Date   ALT 17 11/29/2020   AST 18 11/29/2020   ALKPHOS 65 11/29/2020   BILITOT 0.4 11/29/2020   No results found for: 25OHVITD2, 25OHVITD3, VD25OH   Review of Systems  Constitutional:  Negative for fatigue, fever and unexpected weight change.  Respiratory:  Negative for cough, chest tightness, shortness of breath and wheezing.   Cardiovascular:  Negative for chest pain and leg swelling.  Gastrointestinal:  Negative for abdominal pain, diarrhea and vomiting.  Musculoskeletal:  Positive for arthralgias (right shoulder, left hip), back pain (lumbar), myalgias (right shoulder and left arm 2/2 cervical disease) and neck pain.  Neurological:  Positive for headaches. Negative for dizziness.  Psychiatric/Behavioral:  Negative for dysphoric mood. The patient is not nervous/anxious.    Patient Active Problem List   Diagnosis Date Noted   BMI 32.0-32.9,adult 05/21/2016   Tobacco use disorder,  moderate, in sustained remission 04/23/2016   H/O cardiac catheterization 03/20/2015   Coronary artery disease involving native heart with angina pectoris (HCC) 03/19/2015   Essential (primary) hypertension 03/19/2015   Fibrositis 03/19/2015   Gastro-esophageal reflux disease without esophagitis 03/19/2015   Hyperlipidemia, mixed 03/19/2015   H/O arthrodesis 03/19/2015    Allergies  Allergen Reactions   Azithromycin Diarrhea   Cephalosporins    Ibuprofen Nausea Only   Nsaids    Penicillins    Sulfa  Antibiotics Photosensitivity    Past Surgical History:  Procedure Laterality Date   ABDOMINAL HYSTERECTOMY  1986   BACK SURGERY  08/20/2020   L4 L5 S1 nerve ablation [facility]   CARPAL TUNNEL RELEASE Right    CERVICAL FUSION  2015   CESAREAN SECTION  1984   COLONOSCOPY WITH ESOPHAGOGASTRODUODENOSCOPY ([facility])  08/31/2018   Dr. Earlean Polka   KNEE ARTHROSCOPY Right    PARTIAL HYSTERECTOMY     ROTATOR CUFF REPAIR Left     Social History   Tobacco Use   Smoking status: Former    Packs/day: 0.50    Years: 45.00    Pack years: 22.50    Types: Cigarettes    Quit date: 03/13/2012    Years since quitting: 9.5   Smokeless tobacco: Never  Vaping Use   Vaping Use: Never used  Substance Use Topics   Alcohol use: No    Alcohol/week: 0.0 standard drinks   Drug use: No     Medication list has been reviewed and updated.  Current Meds  Medication Sig   acetaminophen-codeine (TYLENOL #4) 300-60 MG per tablet Take 1-2 tablets by mouth 4 (four) times daily as needed.    ANORO ELLIPTA 62.5-25 MCG/INH AEPB INHALE 1 PUFF INTO THE LUNGS DAILY (Patient taking differently: as needed.)   ASHWAGANDHA PO Take by mouth. Gummy supplement   fluticasone (FLONASE) 50 MCG/ACT nasal spray SHAKE LIQUID AND USE 2 SPRAYS IN EACH NOSTRIL DAILY   metoprolol tartrate (LOPRESSOR) 25 MG tablet Take 25 mg by mouth 2 (two) times daily.    nitroGLYCERIN (NITROSTAT) 0.4 MG SL tablet Place 1 tablet under the tongue daily as needed.   omeprazole (PRILOSEC) 20 MG capsule TAKE 1 CAPSULE TWICE DAILY BEFORE MEALS   rosuvastatin (CRESTOR) 40 MG tablet Take 1 tablet by mouth daily.   tiZANidine (ZANAFLEX) 4 MG tablet Take 4 mg by mouth 3 (three) times daily.    triamterene-hydrochlorothiazide (MAXZIDE-25) 37.5-25 MG tablet TAKE 1 TABLET EVERY DAY   WIXELA INHUB 100-50 MCG/DOSE AEPB INHALE 1 PUFF INTO THE LUNGS TWICE DAILY    PHQ 2/9 Scores 09/11/2021 09/09/2021 11/29/2020 10/25/2020  PHQ - 2 Score 0 0 0 0  PHQ- 9 Score 0 - 3  2    GAD 7 : Generalized Anxiety Score 09/11/2021 10/25/2020  Nervous, Anxious, on Edge 0 0  Control/stop worrying 0 0  Worry too much - different things 0 0  Trouble relaxing 0 0  Restless 0 0  Easily annoyed or irritable 0 1  Afraid - awful might happen 0 0  Total GAD 7 Score 0 1    BP Readings from Last 3 Encounters:  09/11/21 138/82  11/29/20 128/80  11/02/19 112/70    Physical Exam Vitals and nursing note reviewed.  Constitutional:      General: She is not in acute distress.    Appearance: Normal appearance. She is well-developed.  HENT:     Head: Normocephalic and atraumatic.  Cardiovascular:     Rate and  Rhythm: Normal rate and regular rhythm.  Pulmonary:     Effort: Pulmonary effort is normal. No respiratory distress.     Breath sounds: No wheezing or rhonchi.  Musculoskeletal:     Right shoulder: Tenderness present. No deformity or effusion. Decreased range of motion.     Left shoulder: Normal.     Cervical back: Spasms and tenderness present. Decreased range of motion.     Left hip: Tenderness present.  Skin:    General: Skin is warm and dry.     Findings: No rash.  Neurological:     Mental Status: She is alert and oriented to person, place, and time.  Psychiatric:        Mood and Affect: Mood normal.        Behavior: Behavior normal.    Wt Readings from Last 3 Encounters:  11/29/20 169 lb (76.7 kg)  11/02/19 164 lb (74.4 kg)  08/31/19 165 lb (74.8 kg)    BP 138/82   Pulse 73   Temp 98.5 F (36.9 C) (Oral)   Ht 5\' 2"  (1.575 m)   SpO2 94%   BMI 30.91 kg/m   Assessment and Plan: 1. Chronic right shoulder pain She would like to go outside of [facility] for this new problem. - Ambulatory referral to Orthopedic Surgery  2. Essential (primary) hypertension Clinically stable exam with well controlled BP. Tolerating medications without side effects at this time. Pt to continue current regimen and low sodium diet; benefits of regular exercise as  able discussed.  3. Cervical radiculopathy With left sided arm and hand sx Planning nerve ablation in the near future with Dr. at [facility]  4. Trochanteric bursitis of left hip S/p steroid injection - hopefully sx will improve Continue AS Tylenol as needed   5. Fibrositis Uncertain as to the accuracy of this dx. She is weaning off of Tylenol #4 She has tired Lyrica in the past - did not tolerate it.  Recently tried gabapentin but it caused significant nausea. Recommend addressing the specific Ortho issues and continuing Tylenol Consider trial of SSNI   Partially dictated using Jordan. Any errors are unintentional.  Animal nutritionist, MD [facility]  09/11/2021

## 2021-10-07 DIAGNOSIS — I252 Old myocardial infarction: Secondary | ICD-10-CM | POA: Diagnosis not present

## 2021-10-07 DIAGNOSIS — E785 Hyperlipidemia, unspecified: Secondary | ICD-10-CM | POA: Diagnosis not present

## 2021-10-07 DIAGNOSIS — I10 Essential (primary) hypertension: Secondary | ICD-10-CM | POA: Diagnosis not present

## 2021-10-07 DIAGNOSIS — Z79891 Long term (current) use of opiate analgesic: Secondary | ICD-10-CM | POA: Diagnosis not present

## 2021-10-07 DIAGNOSIS — K219 Gastro-esophageal reflux disease without esophagitis: Secondary | ICD-10-CM | POA: Diagnosis not present

## 2021-10-07 DIAGNOSIS — Z8249 Family history of ischemic heart disease and other diseases of the circulatory system: Secondary | ICD-10-CM | POA: Diagnosis not present

## 2021-10-07 DIAGNOSIS — I25119 Atherosclerotic heart disease of native coronary artery with unspecified angina pectoris: Secondary | ICD-10-CM | POA: Diagnosis not present

## 2021-10-07 DIAGNOSIS — E669 Obesity, unspecified: Secondary | ICD-10-CM | POA: Diagnosis not present

## 2021-10-07 DIAGNOSIS — Z6830 Body mass index (BMI) 30.0-30.9, adult: Secondary | ICD-10-CM | POA: Diagnosis not present

## 2021-10-21 DIAGNOSIS — M75101 Unspecified rotator cuff tear or rupture of right shoulder, not specified as traumatic: Secondary | ICD-10-CM | POA: Diagnosis not present

## 2021-10-25 DIAGNOSIS — M75101 Unspecified rotator cuff tear or rupture of right shoulder, not specified as traumatic: Secondary | ICD-10-CM | POA: Diagnosis not present

## 2021-10-31 DIAGNOSIS — M79659 Pain in unspecified thigh: Secondary | ICD-10-CM | POA: Diagnosis not present

## 2021-11-05 DIAGNOSIS — M25551 Pain in right hip: Secondary | ICD-10-CM | POA: Diagnosis not present

## 2021-11-05 DIAGNOSIS — S76911A Strain of unspecified muscles, fascia and tendons at thigh level, right thigh, initial encounter: Secondary | ICD-10-CM | POA: Diagnosis not present

## 2021-11-05 DIAGNOSIS — S32010A Wedge compression fracture of first lumbar vertebra, initial encounter for closed fracture: Secondary | ICD-10-CM | POA: Diagnosis not present

## 2021-11-05 DIAGNOSIS — M5136 Other intervertebral disc degeneration, lumbar region: Secondary | ICD-10-CM | POA: Diagnosis not present

## 2021-11-05 DIAGNOSIS — M1611 Unilateral primary osteoarthritis, right hip: Secondary | ICD-10-CM | POA: Diagnosis not present

## 2021-11-05 DIAGNOSIS — M545 Low back pain, unspecified: Secondary | ICD-10-CM | POA: Diagnosis not present

## 2021-11-11 DIAGNOSIS — I10 Essential (primary) hypertension: Secondary | ICD-10-CM | POA: Diagnosis not present

## 2021-11-11 DIAGNOSIS — M47812 Spondylosis without myelopathy or radiculopathy, cervical region: Secondary | ICD-10-CM | POA: Diagnosis not present

## 2021-11-13 HISTORY — PX: ROTATOR CUFF REPAIR W/ DISTAL CLAVICLE EXCISION: SHX2365

## 2021-11-14 DIAGNOSIS — M25511 Pain in right shoulder: Secondary | ICD-10-CM | POA: Diagnosis not present

## 2021-11-19 DIAGNOSIS — S76911A Strain of unspecified muscles, fascia and tendons at thigh level, right thigh, initial encounter: Secondary | ICD-10-CM | POA: Diagnosis not present

## 2021-12-04 ENCOUNTER — Encounter: Payer: Self-pay | Admitting: Internal Medicine

## 2021-12-04 ENCOUNTER — Other Ambulatory Visit: Payer: Self-pay

## 2021-12-04 ENCOUNTER — Ambulatory Visit (INDEPENDENT_AMBULATORY_CARE_PROVIDER_SITE_OTHER): Payer: Medicare PPO | Admitting: Internal Medicine

## 2021-12-04 VITALS — BP 114/78 | HR 66 | Ht 62.0 in | Wt 170.0 lb

## 2021-12-04 DIAGNOSIS — Z0000 Encounter for general adult medical examination without abnormal findings: Secondary | ICD-10-CM | POA: Diagnosis not present

## 2021-12-04 DIAGNOSIS — K219 Gastro-esophageal reflux disease without esophagitis: Secondary | ICD-10-CM | POA: Diagnosis not present

## 2021-12-04 DIAGNOSIS — Z13820 Encounter for screening for osteoporosis: Secondary | ICD-10-CM

## 2021-12-04 DIAGNOSIS — M797 Fibromyalgia: Secondary | ICD-10-CM | POA: Diagnosis not present

## 2021-12-04 DIAGNOSIS — E782 Mixed hyperlipidemia: Secondary | ICD-10-CM | POA: Diagnosis not present

## 2021-12-04 DIAGNOSIS — M75101 Unspecified rotator cuff tear or rupture of right shoulder, not specified as traumatic: Secondary | ICD-10-CM

## 2021-12-04 DIAGNOSIS — M12811 Other specific arthropathies, not elsewhere classified, right shoulder: Secondary | ICD-10-CM | POA: Diagnosis not present

## 2021-12-04 DIAGNOSIS — Z1231 Encounter for screening mammogram for malignant neoplasm of breast: Secondary | ICD-10-CM

## 2021-12-04 DIAGNOSIS — I25119 Atherosclerotic heart disease of native coronary artery with unspecified angina pectoris: Secondary | ICD-10-CM | POA: Diagnosis not present

## 2021-12-04 DIAGNOSIS — I10 Essential (primary) hypertension: Secondary | ICD-10-CM

## 2021-12-04 LAB — POCT URINALYSIS DIPSTICK
Bilirubin, UA: NEGATIVE
Blood, UA: NEGATIVE
Glucose, UA: NEGATIVE
Ketones, UA: NEGATIVE
Leukocytes, UA: NEGATIVE
Nitrite, UA: NEGATIVE
Protein, UA: NEGATIVE
Spec Grav, UA: <=1.005 — AB (ref 1.010–1.025)
Urobilinogen, UA: 0.2 E.U./dL
pH, UA: 6.5 (ref 5.0–8.0)

## 2021-12-04 MED ORDER — TRIAMTERENE-HCTZ 37.5-25 MG PO TABS: 1.0000 | ORAL_TABLET | Freq: Every day | ORAL | 3 refills | Status: DC

## 2021-12-04 MED ORDER — OMEPRAZOLE 20 MG PO CPDR: DELAYED_RELEASE_CAPSULE | ORAL | 3 refills | Status: DC

## 2021-12-04 NOTE — Progress Notes (Signed)
Date:  12/04/2021   Name:  Jordan Baxter   DOB:  03-Apr-1951   MRN:  [id number]   Chief Complaint: No chief complaint on file. Jordan Baxter is a 71 y.o. female who presents today for her Complete Annual Exam. She feels well. She reports exercising none. She reports she is sleeping well. Breast complaints none.  Mammogram: 06/2020 [facility] DEXA: 06/2016 osteopenia Pap smear: discontinued Colonoscopy: 12/2019  Immunization History  Administered Date(s) Administered   Fluad Quad(high Dose 65+) 08/10/2019   Influenza Split 10/04/2009   Influenza, High Dose Seasonal PF 08/25/2018   Influenza,inj,Quad PF,6+ Mos 07/30/2017   Influenza-Unspecified 07/22/2015   Pneumococcal Conjugate-13 08/22/2016   Pneumococcal Polysaccharide-23 11/13/2009, 07/30/2017   Tdap 05/03/2014   Zoster Recombinat (Shingrix) 11/03/2017, 01/01/2018   Zoster, Live 10/15/2011    Hypertension This is a chronic problem. The problem is controlled. Pertinent negatives include no chest pain, headaches, palpitations or shortness of breath. Past treatments include central alpha agonists, diuretics and beta blockers. The current treatment provides significant improvement. Hypertensive end-organ damage includes CAD/MI. There is no history of kidney disease or CVA.  Hyperlipidemia This is a chronic problem. The problem is controlled. Associated symptoms include myalgias. Pertinent negatives include no chest pain or shortness of breath. Current antihyperlipidemic treatment includes statins. The current treatment provides significant improvement of lipids.  Orthopedic issues - she had had nerve ablation in her back but is now having other issues.  Torn muscles in her hip that she sustained while doing a yard sale.  Also torn right shoulder rotator cuff.  One Ortho mentioned that she should be tested for auto immune disease - her sister has SLE and RA.  Lab Results  Component Value Date   NA 142 11/29/2020   K 4.5 11/29/2020   CO2 26  11/29/2020   GLUCOSE 93 11/29/2020   BUN 17 11/29/2020   CREATININE 0.76 11/29/2020   CALCIUM 10.0 11/29/2020   GFRNONAA 80 11/29/2020   Lab Results  Component Value Date   CHOL 151 11/29/2020   HDL 57 11/29/2020   LDLCALC 69 11/29/2020   TRIG 148 11/29/2020   CHOLHDL 2.6 11/29/2020   Lab Results  Component Value Date   TSH 0.944 11/29/2020   No results found for: HGBA1C Lab Results  Component Value Date   WBC 5.1 11/29/2020   HGB 14.3 11/29/2020   HCT 43.5 11/29/2020   MCV 90 11/29/2020   PLT 329 11/29/2020   Lab Results  Component Value Date   ALT 17 11/29/2020   AST 18 11/29/2020   ALKPHOS 65 11/29/2020   BILITOT 0.4 11/29/2020   No results found for: 25OHVITD2, 25OHVITD3, VD25OH   Review of Systems  Constitutional:  Negative for chills, diaphoresis, fatigue and unexpected weight change.  HENT:  Negative for trouble swallowing.   Respiratory:  Negative for cough, chest tightness, shortness of breath and wheezing.   Cardiovascular:  Negative for chest pain, palpitations and leg swelling.  Gastrointestinal:  Negative for abdominal pain, constipation and diarrhea.  Musculoskeletal:  Positive for arthralgias, back pain and myalgias.  Neurological:  Negative for dizziness, light-headedness and headaches.  Hematological:  Negative for adenopathy.  Psychiatric/Behavioral:  Negative for dysphoric mood and sleep disturbance. The patient is not nervous/anxious.    Patient Active Problem List   Diagnosis Date Noted   Trochanteric bursitis of left hip 09/11/2021   Cervical radiculopathy 09/11/2021   BMI 32.0-32.9,adult 05/21/2016   Tobacco use disorder, moderate, in sustained remission 04/23/2016  H/O cardiac catheterization 03/20/2015   Coronary artery disease involving native heart with angina pectoris (HCC) 03/19/2015   Essential (primary) hypertension 03/19/2015   Fibrositis 03/19/2015   Gastro-esophageal reflux disease without esophagitis 03/19/2015    Hyperlipidemia, mixed 03/19/2015   H/O arthrodesis 03/19/2015    Allergies  Allergen Reactions   Azithromycin Diarrhea   Cephalosporins    Ibuprofen Nausea Only   Nsaids    Penicillins    Sulfa Antibiotics Photosensitivity    Past Surgical History:  Procedure Laterality Date   ABDOMINAL HYSTERECTOMY  1986   BACK SURGERY  08/20/2020   L4 L5 S1 nerve [facility]   CARPAL TUNNEL RELEASE Right    CERVICAL FUSION  2015   CESAREAN SECTION  1984   COLONOSCOPY WITH ESOPHAGOGASTRODUODENOSCOPY ([facility])  08/31/2018   Dr. Earlean Polka   KNEE ARTHROSCOPY Right    PARTIAL HYSTERECTOMY     ROTATOR CUFF REPAIR Left     Social History   Tobacco Use   Smoking status: Former    Packs/day: 0.50    Years: 45.00    Pack years: 22.50    Types: Cigarettes    Quit date: 03/13/2012    Years since quitting: 9.7   Smokeless tobacco: Never  Vaping Use   Vaping Use: Never used  Substance Use Topics   Alcohol use: No    Alcohol/week: 0.0 standard drinks   Drug use: No     Medication list has been reviewed and updated.  Current Meds  Medication Sig   Acetaminophen (ARTHRITIS PAIN PO) Take by mouth daily.   metoprolol tartrate (LOPRESSOR) 25 MG tablet Take 25 mg by mouth 2 (two) times daily.    nitroGLYCERIN (NITROSTAT) 0.4 MG SL tablet Place 1 tablet under the tongue daily as needed.   omeprazole (PRILOSEC) 20 MG capsule TAKE 1 CAPSULE TWICE DAILY BEFORE MEALS   rosuvastatin (CRESTOR) 40 MG tablet Take 1 tablet by mouth daily.   triamterene-hydrochlorothiazide (MAXZIDE-25) 37.5-25 MG tablet TAKE 1 TABLET EVERY DAY   [DISCONTINUED] acetaminophen-codeine (TYLENOL #4) 300-60 MG per tablet Take 1-2 tablets by mouth 4 (four) times daily as needed.    [DISCONTINUED] ANORO ELLIPTA 62.5-25 MCG/INH AEPB INHALE 1 PUFF INTO THE LUNGS DAILY (Patient taking differently: as needed.)   [DISCONTINUED] fluticasone (FLONASE) 50 MCG/ACT nasal spray SHAKE LIQUID AND USE 2 SPRAYS IN EACH NOSTRIL DAILY    [DISCONTINUED] tiZANidine (ZANAFLEX) 4 MG tablet Take 4 mg by mouth 3 (three) times daily.     PHQ 2/9 Scores 12/04/2021 09/11/2021 09/09/2021 11/29/2020  PHQ - 2 Score 0 0 0 0  PHQ- 9 Score 1 0 - 3    GAD 7 : Generalized Anxiety Score 12/04/2021 09/11/2021 10/25/2020  Nervous, Anxious, on Edge 0 0 0  Control/stop worrying 0 0 0  Worry too much - different things 0 0 0  Trouble relaxing 0 0 0  Restless 0 0 0  Easily annoyed or irritable 0 0 1  Afraid - awful might happen 0 0 0  Total GAD 7 Score 0 0 1  Anxiety Difficulty Not difficult at all - -    BP Readings from Last 3 Encounters:  12/04/21 114/78  09/11/21 138/82  11/29/20 128/80    Physical Exam Vitals and nursing note reviewed.  Constitutional:      General: She is not in acute distress.    Appearance: Normal appearance. She is well-developed.  HENT:     Head: Normocephalic and atraumatic.     Right Ear: Tympanic  membrane and ear canal normal.     Left Ear: Tympanic membrane and ear canal normal.     Nose:     Right Sinus: No maxillary sinus tenderness.     Left Sinus: No maxillary sinus tenderness.  Eyes:     General: No scleral icterus.       Right eye: No discharge.        Left eye: No discharge.     Conjunctiva/sclera: Conjunctivae normal.  Neck:     Thyroid: No thyromegaly.     Vascular: No carotid bruit.  Cardiovascular:     Rate and Rhythm: Normal rate and regular rhythm.     Pulses: Normal pulses.     Heart sounds: Normal heart sounds.  Pulmonary:     Effort: Pulmonary effort is normal. No respiratory distress.     Breath sounds: No wheezing.  Chest:  Breasts:    Right: No mass, nipple discharge, skin change or tenderness.     Left: No mass, nipple discharge, skin change or tenderness.  Abdominal:     General: Bowel sounds are normal.     Palpations: Abdomen is soft.     Tenderness: There is no abdominal tenderness.  Musculoskeletal:     Right shoulder: Tenderness present. Decreased range of  motion.     Cervical back: Normal range of motion. No erythema.     Left hip: Tenderness present. Decreased range of motion.     Right lower leg: No edema.     Left lower leg: No edema.  Lymphadenopathy:     Cervical: No cervical adenopathy.  Skin:    General: Skin is warm and dry.     Capillary Refill: Capillary refill takes less than 2 seconds.     Findings: No rash.  Neurological:     General: No focal deficit present.     Mental Status: She is alert and oriented to person, place, and time.     Cranial Nerves: No cranial nerve deficit.     Sensory: No sensory deficit.     Deep Tendon Reflexes: Reflexes are normal and symmetric.  Psychiatric:        Attention and Perception: Attention normal.        Mood and Affect: Mood normal.    Wt Readings from Last 3 Encounters:  12/04/21 170 lb (77.1 kg)  09/11/21 168 lb (76.2 kg)  11/29/20 169 lb (76.7 kg)    BP 114/78    Pulse 66    Ht  (1.575 m)    Wt 170 lb (77.1 kg)    SpO2 95%    BMI 31.09 kg/m   Assessment and Plan: 1. Annual physical exam Exam is normal except for weight. Encourage appropriate dietary changes and exercise as able. - Hemoglobin A1c  2. Encounter for screening mammogram for breast cancer Pt to schedule at [facility]  3. Encounter for screening for osteoporosis Due for screening  4. Essential (primary) hypertension Clinically stable exam with well controlled BP. Tolerating medications without side effects at this time. Pt to continue current regimen and low sodium diet; benefits of regular exercise as able discussed. - CBC with Differential/Platelet - Comprehensive metabolic panel - TSH - POCT urinalysis dipstick - triamterene-hydrochlorothiazide (MAXZIDE-25) 37.5-25 MG tablet; Take 1 tablet by mouth daily.  Dispense: 90 tablet; Refill: 3  5. Gastro-esophageal reflux disease without esophagitis Symptoms well controlled on daily PPI No red flag signs such as weight loss, n/v, melena. History of stomach  ulcer. Will continue  daily omeprazole bid.. - CBC with Differential/Platelet - omeprazole (PRILOSEC) 20 MG capsule; TAKE 1 CAPSULE TWICE DAILY BEFORE MEALS  Dispense: 180 capsule; Refill: 3  6. Hyperlipidemia, mixed Tolerating statin medication without side effects at this time LDL is at goal of < 70 on current dose Continue same therapy without change at this time. - Lipid panel  7. Fibrositis Long standing pain   8. Coronary artery disease involving native coronary artery of native heart with angina pectoris (HCC) S/p DES in 2016 Continues on statin and beta blocker  9. Rotator cuff tear arthropathy of right shoulder Currently being treated and followed by Orthopedics Will get screening labs to rule out overlapping autoimmune process - ANA w/Reflex if Positive - Rheumatoid factor   Partially dictated using Animal nutritionist. Any errors are unintentional.  Bari Edward, MD [facility]  12/04/2021

## 2021-12-05 LAB — CBC WITH DIFFERENTIAL/PLATELET
Basophils Absolute: 0.1 10*3/uL (ref 0.0–0.2)
Basos: 1 %
EOS (ABSOLUTE): 0.2 10*3/uL (ref 0.0–0.4)
Eos: 3 %
Hematocrit: 44.2 % (ref 34.0–46.6)
Hemoglobin: 14.5 g/dL (ref 11.1–15.9)
Immature Grans (Abs): 0 10*3/uL (ref 0.0–0.1)
Immature Granulocytes: 0 %
Lymphocytes Absolute: 1.9 10*3/uL (ref 0.7–3.1)
Lymphs: 30 %
MCH: 30 pg (ref 26.6–33.0)
MCHC: 32.8 g/dL (ref 31.5–35.7)
MCV: 92 fL (ref 79–97)
Monocytes Absolute: 0.6 10*3/uL (ref 0.1–0.9)
Monocytes: 10 %
Neutrophils Absolute: 3.5 10*3/uL (ref 1.4–7.0)
Neutrophils: 56 %
Platelets: 303 10*3/uL (ref 150–450)
RBC: 4.83 x10E6/uL (ref 3.77–5.28)
RDW: 13.8 % (ref 11.7–15.4)
WBC: 6.3 10*3/uL (ref 3.4–10.8)

## 2021-12-05 LAB — COMPREHENSIVE METABOLIC PANEL
ALT: 23 IU/L (ref 0–32)
AST: 20 IU/L (ref 0–40)
Albumin/Globulin Ratio: 2.5 — ABNORMAL HIGH (ref 1.2–2.2)
Albumin: 4.8 g/dL (ref 3.8–4.8)
Alkaline Phosphatase: 63 IU/L (ref 44–121)
BUN/Creatinine Ratio: 18 (ref 12–28)
BUN: 14 mg/dL (ref 8–27)
Bilirubin Total: 0.4 mg/dL (ref 0.0–1.2)
CO2: 29 mmol/L (ref 20–29)
Calcium: 9.6 mg/dL (ref 8.7–10.3)
Chloride: 100 mmol/L (ref 96–106)
Creatinine, Ser: 0.79 mg/dL (ref 0.57–1.00)
Globulin, Total: 1.9 g/dL (ref 1.5–4.5)
Glucose: 88 mg/dL (ref 70–99)
Potassium: 4.3 mmol/L (ref 3.5–5.2)
Sodium: 143 mmol/L (ref 134–144)
Total Protein: 6.7 g/dL (ref 6.0–8.5)
eGFR: 80 mL/min/{1.73_m2} (ref >59)

## 2021-12-05 LAB — ANA W/REFLEX IF POSITIVE: Anti Nuclear Antibody (ANA): NEGATIVE

## 2021-12-05 LAB — TSH: TSH: 1.49 u[IU]/mL (ref 0.450–4.500)

## 2021-12-05 LAB — HEMOGLOBIN A1C
Est. average glucose Bld gHb Est-mCnc: 140 mg/dL
Hgb A1c MFr Bld: 6.5 % — ABNORMAL HIGH (ref 4.8–5.6)

## 2021-12-05 LAB — LIPID PANEL
Chol/HDL Ratio: 2.6 ratio (ref 0.0–4.4)
Cholesterol, Total: 161 mg/dL (ref 100–199)
HDL: 62 mg/dL (ref >39)
LDL Chol Calc (NIH): 73 mg/dL (ref 0–99)
Triglycerides: 156 mg/dL — ABNORMAL HIGH (ref 0–149)
VLDL Cholesterol Cal: 26 mg/dL (ref 5–40)

## 2021-12-05 LAB — RHEUMATOID FACTOR: Rheumatoid fact SerPl-aCnc: <10 IU/mL (ref <14.0)

## 2021-12-09 DIAGNOSIS — M25511 Pain in right shoulder: Secondary | ICD-10-CM | POA: Diagnosis not present

## 2021-12-17 DIAGNOSIS — S76911A Strain of unspecified muscles, fascia and tendons at thigh level, right thigh, initial encounter: Secondary | ICD-10-CM | POA: Diagnosis not present

## 2021-12-20 ENCOUNTER — Other Ambulatory Visit: Payer: Self-pay

## 2021-12-20 ENCOUNTER — Ambulatory Visit: Payer: Medicare PPO | Admitting: Internal Medicine

## 2021-12-20 ENCOUNTER — Encounter: Payer: Self-pay | Admitting: Internal Medicine

## 2021-12-20 ENCOUNTER — Other Ambulatory Visit: Payer: Self-pay | Admitting: Internal Medicine

## 2021-12-20 VITALS — BP 114/78 | HR 68 | Temp 98.4°F | Ht 62.0 in | Wt 169.0 lb

## 2021-12-20 DIAGNOSIS — K219 Gastro-esophageal reflux disease without esophagitis: Secondary | ICD-10-CM

## 2021-12-20 DIAGNOSIS — J3089 Other allergic rhinitis: Secondary | ICD-10-CM

## 2021-12-20 DIAGNOSIS — Z13820 Encounter for screening for osteoporosis: Secondary | ICD-10-CM

## 2021-12-20 NOTE — Progress Notes (Signed)
? ? ?Date:  12/20/2021  ? ?Name:  Jordan Baxter   DOB:  February 10, 1951   MRN:  [id number] ? ? ?Chief Complaint: Allergies (X1 week, watery eyes, cough, chest tightness, sneezing, runny nose, no fever, negative covid 2 days ago ) ? ?Sinus Problem ?This is a new problem. The current episode started 1 to 4 weeks ago. The problem is unchanged. There has been no fever. Associated symptoms include coughing, sinus pressure and sneezing. Pertinent negatives include no chills, congestion or shortness of breath. (Watery eyes, PND, dry cough) Past treatments include nothing.  ? ?Lab Results  ?Component Value Date  ? NA 143 12/04/2021  ? K 4.3 12/04/2021  ? CO2 29 12/04/2021  ? GLUCOSE 88 12/04/2021  ? BUN 14 12/04/2021  ? CREATININE 0.79 12/04/2021  ? CALCIUM 9.6 12/04/2021  ? EGFR 80 12/04/2021  ? GFRNONAA 80 11/29/2020  ? ?Lab Results  ?Component Value Date  ? CHOL 161 12/04/2021  ? HDL 62 12/04/2021  ? [place] 73 12/04/2021  ? TRIG 156 (H) 12/04/2021  ? CHOLHDL 2.6 12/04/2021  ? ?Lab Results  ?Component Value Date  ? TSH 1.490 12/04/2021  ? ?Lab Results  ?Component Value Date  ? HGBA1C 6.5 (H) 12/04/2021  ? ?Lab Results  ?Component Value Date  ? WBC 6.3 12/04/2021  ? HGB 14.5 12/04/2021  ? HCT 44.2 12/04/2021  ? MCV 92 12/04/2021  ? PLT 303 12/04/2021  ? ?Lab Results  ?Component Value Date  ? ALT 23 12/04/2021  ? AST 20 12/04/2021  ? ALKPHOS 63 12/04/2021  ? BILITOT 0.4 12/04/2021  ? ?No results found for: 25OHVITD2, [place], VD25OH  ? ?Review of Systems  ?Constitutional:  Negative for chills, fatigue and fever.  ?HENT:  Positive for postnasal drip, rhinorrhea, sinus pressure and sneezing. Negative for congestion, trouble swallowing and voice change.   ?Respiratory:  Positive for cough. Negative for chest tightness, shortness of breath and wheezing.   ?Cardiovascular:  Negative for chest pain and palpitations.  ? ?Patient Active Problem List  ? Diagnosis Date Noted  ? Rotator cuff tear arthropathy of right shoulder 12/04/2021   ? Trochanteric bursitis of left hip 09/11/2021  ? Cervical radiculopathy 09/11/2021  ? BMI 32.0-32.9,adult 05/21/2016  ? Tobacco use disorder, moderate, in sustained remission 04/23/2016  ? H/O cardiac catheterization 03/20/2015  ? Coronary artery disease involving native heart with angina pectoris ([place]) 03/19/2015  ? Essential (primary) hypertension 03/19/2015  ? Fibrositis 03/19/2015  ? Gastro-esophageal reflux disease without esophagitis 03/19/2015  ? Hyperlipidemia, mixed 03/19/2015  ? H/O arthrodesis 03/19/2015  ? ? ?Allergies  ?Allergen Reactions  ? Azithromycin Diarrhea  ? Cephalosporins   ? Ibuprofen Nausea Only  ? Nsaids   ? Penicillins   ? Sulfa Antibiotics Photosensitivity  ? ? ?Past Surgical History:  ?Procedure Laterality Date  ? ABDOMINAL HYSTERECTOMY  1986  ? BACK SURGERY  08/20/2020  ? L4 L5 S1 nerve [facility]  ? CARPAL TUNNEL RELEASE Right   ? CERVICAL FUSION  2015  ? [place]  ? COLONOSCOPY WITH ESOPHAGOGASTRODUODENOSCOPY (EGD)  08/31/2018  ? Dr. Epimenio Foot  ? KNEE ARTHROSCOPY Right   ? PARTIAL HYSTERECTOMY    ? ROTATOR CUFF REPAIR Left   ? ? ?Social History  ? ?Tobacco Use  ? Smoking status: Former  ?  Packs/day: 0.50  ?  Years: 45.00  ?  Pack years: 22.50  ?  Types: Cigarettes  ?  Quit date: 03/13/2012  ?  Years since quitting: 9.7  ?  Smokeless tobacco: Never  ?Vaping Use  ? Vaping Use: Never used  ?Substance Use Topics  ? Alcohol use: No  ?  Alcohol/week: 0.0 standard drinks  ? Drug use: No  ? ? ? ?Medication list has been reviewed and updated. ? ?Current Meds  ?Medication Sig  ? Acetaminophen (ARTHRITIS PAIN PO) Take by mouth daily.  ? metoprolol tartrate (LOPRESSOR) 25 MG tablet Take 25 mg by mouth 2 (two) times daily.   ? nitroGLYCERIN (NITROSTAT) 0.4 MG SL tablet Place 1 tablet under the tongue daily as needed.  ? omeprazole (PRILOSEC) 20 MG capsule TAKE 1 CAPSULE TWICE DAILY BEFORE MEALS  ? rosuvastatin (CRESTOR) 40 MG tablet Take 1 tablet by mouth daily.  ?  triamterene-hydrochlorothiazide (MAXZIDE-25) 37.5-25 MG tablet Take 1 tablet by mouth daily.  ? ? ?PHQ 2/9 Scores 12/20/2021 12/04/2021 09/11/2021 09/09/2021  ?PHQ - 2 Score 0 0 0 0  ?PHQ- 9 Score 0 1 0 -  ? ? ?GAD 7 : Generalized Anxiety Score 12/20/2021 12/04/2021 09/11/2021 10/25/2020  ?Nervous, Anxious, on Edge 0 0 0 0  ?Control/stop worrying 0 0 0 0  ?Worry too much - different things 0 0 0 0  ?Trouble relaxing 0 0 0 0  ?Restless 0 0 0 0  ?Easily annoyed or irritable 0 0 0 1  ?Afraid - awful might happen 0 0 0 0  ?Total GAD 7 Score 0 0 0 1  ?Anxiety Difficulty - Not difficult at all - -  ? ? ?BP Readings from Last 3 Encounters:  ?12/20/21 114/78  ?12/04/21 114/78  ?09/11/21 138/82  ? ? ?Physical Exam ?Vitals and nursing note reviewed.  ?Constitutional:   ?   General: She is not in acute distress. ?   Appearance: Normal appearance. She is well-developed.  ?HENT:  ?   Head: Normocephalic and atraumatic.  ?   Right Ear: Hearing, tympanic membrane and ear canal normal.  ?   Left Ear: Hearing, tympanic membrane and ear canal normal.  ?   Nose:  ?   Right Sinus: Maxillary sinus tenderness present. No frontal sinus tenderness.  ?   Left Sinus: Maxillary sinus tenderness present. No frontal sinus tenderness.  ?Cardiovascular:  ?   Rate and Rhythm: Normal rate and regular rhythm.  ?Pulmonary:  ?   Effort: Pulmonary effort is normal. No respiratory distress.  ?   Breath sounds: No wheezing or rhonchi.  ?Musculoskeletal:  ?   Cervical back: Normal range of motion.  ?Lymphadenopathy:  ?   Cervical: No cervical adenopathy.  ?Skin: ?   General: Skin is warm and dry.  ?   Findings: No rash.  ?Neurological:  ?   Mental Status: She is alert and oriented to person, place, and time.  ?Psychiatric:     ?   Mood and Affect: Mood normal.     ?   Behavior: Behavior normal.  ? ? ?Wt Readings from Last 3 Encounters:  ?12/20/21 169 lb (76.7 kg)  ?12/04/21 170 lb (77.1 kg)  ?09/11/21 168 lb (76.2 kg)  ? ? ?BP 114/78   Pulse 68   Temp 98.4  ?F (36.9 ?C) (Oral)   Ht 5' 2"  (1.575 m)   Wt 169 lb (76.7 kg)   SpO2 94%   BMI 30.91 kg/m?  ? ?Assessment and Plan: ?1. Environmental and seasonal allergies ?No evidence of bacterial infection at this time. ?Begin loratadine or allegra daily along with Fluticasone nasal spray throughout the allergy season ?Call for antibiotics if sinus discharge  becomes discolored ? ? ?Partially dictated using Editor, commissioning. Any errors are unintentional. ? ?Halina Maidens, MD ?[facility] ? ?12/20/2021 ? ? ? ? ?

## 2021-12-20 NOTE — Telephone Encounter (Signed)
Sending to different pharmacy ?Requested Prescriptions  ?Pending Prescriptions Disp Refills  ?? triamterene-hydrochlorothiazide (MAXZIDE-25) 37.5-25 MG tablet [Pharmacy Med Name: TRIAMTERENE/HYDROCHLOROTHIAZIDE 37.5-25 MG Tablet] 90 tablet 3  ?  Sig: TAKE 1 TABLET EVERY DAY  ?  ? Cardiovascular: Diuretic Combos Passed - 12/20/2021  5:31 PM  ?  ?  Passed - K in normal range and within 180 days  ?  Potassium  ?Date Value Ref Range Status  ?12/04/2021 4.3 3.5 - 5.2 mmol/L Final  ?   ?  ?  Passed - Na in normal range and within 180 days  ?  Sodium  ?Date Value Ref Range Status  ?12/04/2021 143 134 - 144 mmol/L Final  ?   ?  ?  Passed - Cr in normal range and within 180 days  ?  Creatinine, Ser  ?Date Value Ref Range Status  ?12/04/2021 0.79 0.57 - 1.00 mg/dL Final  ?   ?  ?  Passed - Last BP in normal range  ?  BP Readings from Last 1 Encounters:  ?12/20/21 114/78  ?   ?  ?  Passed - Valid encounter within last 6 months  ?  Recent Outpatient Visits   ?      ? Today Environmental and seasonal allergies  ? [facility] Glean Hess, MD  ? 2 weeks ago Annual physical exam  ? [facility] Glean Hess, MD  ? 3 months ago Chronic right shoulder pain  ? [facility] Glean Hess, MD  ? 1 year ago Annual physical exam  ? [facility] Glean Hess, MD  ? 1 year ago Acute non-recurrent frontal sinusitis  ? [facility] Glean Hess, MD  ?  ?  ?Future Appointments   ?        ? In 11 months Glean Hess, MD [facility]  ?  ? ?  ?  ?  ?? omeprazole (PRILOSEC) 20 MG capsule [Pharmacy Med Name: OMEPRAZOLE 20 MG Capsule Delayed Release] 180 capsule 3  ?  Sig: TAKE 1 CAPSULE TWICE DAILY BEFORE MEALS  ?  ? Gastroenterology: Proton Pump Inhibitors Passed - 12/20/2021  5:31 PM  ?  ?  Passed - Valid encounter within last 12 months  ?  Recent Outpatient Visits   ?      ? Today Environmental and seasonal allergies  ? [facility] Glean Hess, MD  ? 2 weeks ago Annual physical exam  ? [facility] Glean Hess, MD  ? 3 months ago Chronic right shoulder pain  ? [facility] Glean Hess, MD  ? 1 year ago Annual physical exam  ? [facility] Glean Hess, MD  ? 1 year ago Acute non-recurrent frontal sinusitis  ? [facility] Glean Hess, MD  ?  ?  ?Future Appointments   ?        ? In 11 months Glean Hess, MD [facility] [place]  ?  ? ?  ?  ?  ? ? ?

## 2021-12-20 NOTE — Patient Instructions (Addendum)
Loratadine 10 mg or Fexofenadine 180 mg once a day to reduce sneezing, watery eyes, runny nose, post nasal drip. ? ?Fluticasone nasal spray (Flonase) 2 sprays each nostril once a day. ? ?You can also take Benadryl at bedtime ?

## 2021-12-26 DIAGNOSIS — M79604 Pain in right leg: Secondary | ICD-10-CM | POA: Diagnosis not present

## 2021-12-26 DIAGNOSIS — S76911D Strain of unspecified muscles, fascia and tendons at thigh level, right thigh, subsequent encounter: Secondary | ICD-10-CM | POA: Diagnosis not present

## 2021-12-26 DIAGNOSIS — S76911A Strain of unspecified muscles, fascia and tendons at thigh level, right thigh, initial encounter: Secondary | ICD-10-CM | POA: Diagnosis not present

## 2021-12-30 DIAGNOSIS — M79604 Pain in right leg: Secondary | ICD-10-CM | POA: Diagnosis not present

## 2021-12-30 DIAGNOSIS — S76911D Strain of unspecified muscles, fascia and tendons at thigh level, right thigh, subsequent encounter: Secondary | ICD-10-CM | POA: Diagnosis not present

## 2021-12-30 DIAGNOSIS — S76911A Strain of unspecified muscles, fascia and tendons at thigh level, right thigh, initial encounter: Secondary | ICD-10-CM | POA: Diagnosis not present

## 2022-01-01 DIAGNOSIS — M79604 Pain in right leg: Secondary | ICD-10-CM | POA: Diagnosis not present

## 2022-01-01 DIAGNOSIS — S76911D Strain of unspecified muscles, fascia and tendons at thigh level, right thigh, subsequent encounter: Secondary | ICD-10-CM | POA: Diagnosis not present

## 2022-01-01 DIAGNOSIS — S76911A Strain of unspecified muscles, fascia and tendons at thigh level, right thigh, initial encounter: Secondary | ICD-10-CM | POA: Diagnosis not present

## 2022-01-06 DIAGNOSIS — S76911D Strain of unspecified muscles, fascia and tendons at thigh level, right thigh, subsequent encounter: Secondary | ICD-10-CM | POA: Diagnosis not present

## 2022-01-06 DIAGNOSIS — M79604 Pain in right leg: Secondary | ICD-10-CM | POA: Diagnosis not present

## 2022-01-06 DIAGNOSIS — S76911A Strain of unspecified muscles, fascia and tendons at thigh level, right thigh, initial encounter: Secondary | ICD-10-CM | POA: Diagnosis not present

## 2022-01-13 DIAGNOSIS — M79604 Pain in right leg: Secondary | ICD-10-CM | POA: Diagnosis not present

## 2022-01-13 DIAGNOSIS — S76911D Strain of unspecified muscles, fascia and tendons at thigh level, right thigh, subsequent encounter: Secondary | ICD-10-CM | POA: Diagnosis not present

## 2022-01-15 DIAGNOSIS — M79604 Pain in right leg: Secondary | ICD-10-CM | POA: Diagnosis not present

## 2022-01-15 DIAGNOSIS — S76911D Strain of unspecified muscles, fascia and tendons at thigh level, right thigh, subsequent encounter: Secondary | ICD-10-CM | POA: Diagnosis not present

## 2022-01-20 DIAGNOSIS — M79604 Pain in right leg: Secondary | ICD-10-CM | POA: Diagnosis not present

## 2022-01-20 DIAGNOSIS — S76911D Strain of unspecified muscles, fascia and tendons at thigh level, right thigh, subsequent encounter: Secondary | ICD-10-CM | POA: Diagnosis not present

## 2022-01-22 DIAGNOSIS — S76911D Strain of unspecified muscles, fascia and tendons at thigh level, right thigh, subsequent encounter: Secondary | ICD-10-CM | POA: Diagnosis not present

## 2022-01-22 DIAGNOSIS — M79604 Pain in right leg: Secondary | ICD-10-CM | POA: Diagnosis not present

## 2022-01-27 DIAGNOSIS — S76911D Strain of unspecified muscles, fascia and tendons at thigh level, right thigh, subsequent encounter: Secondary | ICD-10-CM | POA: Diagnosis not present

## 2022-01-27 DIAGNOSIS — M79604 Pain in right leg: Secondary | ICD-10-CM | POA: Diagnosis not present

## 2022-01-30 DIAGNOSIS — S76911D Strain of unspecified muscles, fascia and tendons at thigh level, right thigh, subsequent encounter: Secondary | ICD-10-CM | POA: Diagnosis not present

## 2022-01-30 DIAGNOSIS — M79604 Pain in right leg: Secondary | ICD-10-CM | POA: Diagnosis not present

## 2022-02-03 DIAGNOSIS — M79604 Pain in right leg: Secondary | ICD-10-CM | POA: Diagnosis not present

## 2022-02-03 DIAGNOSIS — S76911D Strain of unspecified muscles, fascia and tendons at thigh level, right thigh, subsequent encounter: Secondary | ICD-10-CM | POA: Diagnosis not present

## 2022-02-06 DIAGNOSIS — M25511 Pain in right shoulder: Secondary | ICD-10-CM | POA: Diagnosis not present

## 2022-02-07 DIAGNOSIS — S76911D Strain of unspecified muscles, fascia and tendons at thigh level, right thigh, subsequent encounter: Secondary | ICD-10-CM | POA: Diagnosis not present

## 2022-02-07 DIAGNOSIS — M79604 Pain in right leg: Secondary | ICD-10-CM | POA: Diagnosis not present

## 2022-02-13 ENCOUNTER — Ambulatory Visit: Payer: Self-pay | Admitting: *Deleted

## 2022-02-13 NOTE — Telephone Encounter (Signed)
Called pt told her that we did not have any appts available to go to UC. Pt verbalized understanding. ? ?KP ?

## 2022-02-13 NOTE — Telephone Encounter (Signed)
?  Chief Complaint: fever, diarrhea ?Symptoms: diarrhea ?Frequency: started Sunday ?Pertinent Negatives: Patient denies abdominal pain ?Disposition: [] ED /[] Urgent Care (no appt availability in office) / [] Appointment(In office/virtual)/ []  [place] [facility]/ [] Home Care/ [] Refused Recommended Disposition /[]  Mobile Bus/ []  Follow-up with PCP ?Additional Notes: Patient states she started with fever/diarrhea Sunday. Fever returned to normal Tuesday. Patient did use Imodium and diarrhea has slowed- but still can not eat without loose stool. Patient has been trying to hydrate- encouraged electrolyte replacement and bland diet- call to office to see if patient can have appointment tomorrow- they request call for review and patient has been informed to expect call back. Patient has been advised UC may be directed if the office can not see her tomorrow. ?

## 2022-02-13 NOTE — Telephone Encounter (Signed)
Summary: advice - fever and diarrhea  ? Pt called in stating she has had a fever for a few days along with diarrhea, pt was not sure if she should be seen, no open appts this week, pt needed advice.   ?  ? ?Reason for Disposition ? [1] MODERATE diarrhea (e.g., 4-6 times / day more than normal) AND [2] present > 48 hours (2 days) ? ?Answer Assessment - Initial Assessment Questions ?1. DIARRHEA SEVERITY: "How bad is the diarrhea?" "How many more stools have you had in the past 24 hours than normal?"  ?  - NO DIARRHEA (SCALE 0) ?  - MILD (SCALE 1-3): Few loose or mushy BMs; increase of 1-3 stools over normal daily number of stools; mild increase in ostomy output. ?  -  MODERATE (SCALE 4-7): Increase of 4-6 stools daily over normal; moderate increase in ostomy output. ?* SEVERE (SCALE 8-10; OR 'WORST POSSIBLE'): Increase of 7 or more stools daily over normal; moderate increase in ostomy output; incontinence. ?    Mild/moderate ?2. ONSET: "When did the diarrhea begin?"  ?    Sunday ?3. BM CONSISTENCY: "How loose or watery is the diarrhea?"  ?    loose ?4. VOMITING: "Are you also vomiting?" If Yes, ask: "How many times in the past 24 hours?"  ?    No- nausea ?5. ABDOMINAL PAIN: "Are you having any abdominal pain?" If Yes, ask: "What does it feel like?" (e.g., crampy, dull, intermittent, constant)  ?    no ?6. ABDOMINAL PAIN SEVERITY: If present, ask: "How bad is the pain?"  (e.g., Scale 1-10; mild, moderate, or severe) ?  - MILD (1-3): doesn't interfere with normal activities, abdomen soft and not tender to touch  ?  - MODERATE (4-7): interferes with normal activities or awakens from sleep, abdomen tender to touch  ?  - SEVERE (8-10): excruciating pain, doubled over, unable to do any normal activities   ?    na ?7. ORAL INTAKE: If vomiting, "Have you been able to drink liquids?" "How much liquids have you had in the past 24 hours?" ?    Yes- water,tea,juice- not enough ?8. HYDRATION: "Any signs of dehydration?" (e.g., dry  mouth [not just dry lips], too weak to stand, dizziness, new weight loss) "When did you last urinate?" ?    No- patient is bed/couch ?9. EXPOSURE: "Have you traveled to a foreign country recently?" "Have you been exposed to anyone with diarrhea?" "Could you have eaten any food that was spoiled?" ?    no ?10. ANTIBIOTIC USE: "Are you taking antibiotics now or have you taken antibiotics in the past 2 months?" ?      no ?11. OTHER SYMPTOMS: "Do you have any other symptoms?" (e.g., fever, blood in stool) ?      Fever- started with diarrhea- normal now ?12. PREGNANCY: "Is there any chance you are pregnant?" "When was your last menstrual period?" ? ?Protocols used: Diarrhea-A-AH ? ?

## 2022-02-14 DIAGNOSIS — R197 Diarrhea, unspecified: Secondary | ICD-10-CM | POA: Diagnosis not present

## 2022-02-19 ENCOUNTER — Other Ambulatory Visit: Payer: Self-pay

## 2022-02-19 ENCOUNTER — Ambulatory Visit: Payer: Medicare PPO | Admitting: Internal Medicine

## 2022-02-19 ENCOUNTER — Encounter: Payer: Self-pay | Admitting: Internal Medicine

## 2022-02-19 VITALS — BP 134/76 | HR 69 | Ht 62.0 in | Wt 162.6 lb

## 2022-02-19 DIAGNOSIS — R7989 Other specified abnormal findings of blood chemistry: Secondary | ICD-10-CM

## 2022-02-19 DIAGNOSIS — R197 Diarrhea, unspecified: Secondary | ICD-10-CM | POA: Diagnosis not present

## 2022-02-19 MED ORDER — DICYCLOMINE HCL 10 MG PO CAPS: 10.0000 mg | ORAL_CAPSULE | Freq: Three times a day (TID) | ORAL | 0 refills | Status: DC

## 2022-02-19 NOTE — Progress Notes (Signed)
? ? ?Date:  02/19/2022  ? ?Name:  Jordan Baxter   DOB:  December 09, 1950   MRN:  [id number] ? ? ?Chief Complaint: Diarrhea (Diarrhea has stopped. Now having more solid stools. ) ? ?Diarrhea  ?This is a recurrent problem. Episode onset: 10 days. The problem has been gradually improving. The stool consistency is described as Watery and mucous. Associated symptoms include arthralgias (right shoulder pain), increased flatus and myalgias. Pertinent negatives include no abdominal pain, chills, fever, headaches or vomiting.  ?Onset 10 days ago with fatigue and fever followed by diarrhea - very loose stools 4-5 times per day.  This lasted for several days then the fever resolved but she continued to feel very weak.  She did a Covid test on day #3 which was negative. ?She went to the UC on day #6.  CMP showed elevated liver tests but normal electrolytes and negative UA.  She was sent home with hydration and Bentyl. ?Currently she is having 1-2 formed stools per day but still feels tired.  No more fever or new symptoms.  She has no sick contacts, drinks well water, no recent antibiotics. ?She is scheduled for shoulder surgery on 03/09/22. ? ? ?Lab Results  ?Component Value Date  ? NA 143 12/04/2021  ? K 4.3 12/04/2021  ? CO2 29 12/04/2021  ? GLUCOSE 88 12/04/2021  ? BUN 14 12/04/2021  ? CREATININE 0.79 12/04/2021  ? CALCIUM 9.6 12/04/2021  ? EGFR 80 12/04/2021  ? GFRNONAA 80 11/29/2020  ? ?Lab Results  ?Component Value Date  ? CHOL 161 12/04/2021  ? HDL 62 12/04/2021  ? [place] 73 12/04/2021  ? TRIG 156 (H) 12/04/2021  ? CHOLHDL 2.6 12/04/2021  ? ?Lab Results  ?Component Value Date  ? TSH 1.490 12/04/2021  ? ?Lab Results  ?Component Value Date  ? HGBA1C 6.5 (H) 12/04/2021  ? ?Lab Results  ?Component Value Date  ? WBC 6.3 12/04/2021  ? HGB 14.5 12/04/2021  ? HCT 44.2 12/04/2021  ? MCV 92 12/04/2021  ? PLT 303 12/04/2021  ? ?Lab Results  ?Component Value Date  ? ALT 23 12/04/2021  ? AST 20 12/04/2021  ? ALKPHOS 63 12/04/2021  ? BILITOT  0.4 12/04/2021  ? ?No results found for: 25OHVITD2, [place], VD25OH  ? ?Review of Systems  ?Constitutional:  Positive for fatigue. Negative for chills, diaphoresis and fever.  ?HENT:  Negative for trouble swallowing.   ?Respiratory:  Negative for chest tightness and shortness of breath.   ?Cardiovascular:  Negative for chest pain.  ?Gastrointestinal:  Positive for flatus. Negative for abdominal pain, blood in stool, diarrhea (resolved), nausea and vomiting.  ?Musculoskeletal:  Positive for arthralgias (right shoulder pain), back pain and myalgias.  ?Neurological:  Negative for dizziness, light-headedness and headaches.  ?Psychiatric/Behavioral:  Negative for dysphoric mood and sleep disturbance. The patient is not nervous/anxious.   ? ?Patient Active Problem List  ? Diagnosis Date Noted  ? Rotator cuff tear arthropathy of right shoulder 12/04/2021  ? Trochanteric bursitis of left hip 09/11/2021  ? Cervical radiculopathy 09/11/2021  ? BMI 32.0-32.9,adult 05/21/2016  ? Tobacco use disorder, moderate, in sustained remission 04/23/2016  ? H/O cardiac catheterization 03/20/2015  ? Coronary artery disease involving native heart with angina pectoris ([place]) 03/19/2015  ? Essential (primary) hypertension 03/19/2015  ? Fibrositis 03/19/2015  ? Gastro-esophageal reflux disease without esophagitis 03/19/2015  ? Hyperlipidemia, mixed 03/19/2015  ? H/O arthrodesis 03/19/2015  ? ? ?Allergies  ?Allergen Reactions  ? Azithromycin Diarrhea  ? Cephalosporins   ?  Ibuprofen Nausea Only  ? Nsaids   ? Penicillins   ? Sulfa Antibiotics Photosensitivity  ? ? ?Past Surgical History:  ?Procedure Laterality Date  ? ABDOMINAL HYSTERECTOMY  1986  ? BACK SURGERY  08/20/2020  ? L4 L5 S1 nerve [facility]  ? CARPAL TUNNEL RELEASE Right   ? CERVICAL FUSION  2015  ? [place]  ? COLONOSCOPY WITH ESOPHAGOGASTRODUODENOSCOPY (EGD)  08/31/2018  ? Dr. Epimenio Foot  ? KNEE ARTHROSCOPY Right   ? PARTIAL HYSTERECTOMY    ? ROTATOR CUFF REPAIR  Left   ? ? ?Social History  ? ?Tobacco Use  ? Smoking status: Former  ?  Packs/day: 0.50  ?  Years: 45.00  ?  Pack years: 22.50  ?  Types: Cigarettes  ?  Quit date: 03/13/2012  ?  Years since quitting: 9.9  ? Smokeless tobacco: Never  ?Vaping Use  ? Vaping Use: Never used  ?Substance Use Topics  ? Alcohol use: No  ?  Alcohol/week: 0.0 standard drinks  ? Drug use: No  ? ? ? ?Medication list has been reviewed and updated. ? ?Current Meds  ?Medication Sig  ? Acetaminophen (ARTHRITIS PAIN PO) Take by mouth daily.  ? metoprolol tartrate (LOPRESSOR) 25 MG tablet Take 25 mg by mouth 2 (two) times daily.   ? nitroGLYCERIN (NITROSTAT) 0.4 MG SL tablet Place 1 tablet under the tongue daily as needed.  ? omeprazole (PRILOSEC) 20 MG capsule TAKE 1 CAPSULE TWICE DAILY BEFORE MEALS  ? rosuvastatin (CRESTOR) 40 MG tablet Take 1 tablet by mouth daily.  ? triamterene-hydrochlorothiazide (MAXZIDE-25) 37.5-25 MG tablet TAKE 1 TABLET EVERY DAY  ? ? ? ?  12/20/2021  ? 10:59 AM 12/04/2021  ? 10:08 AM 09/11/2021  ?  9:56 AM 10/25/2020  ?  7:50 AM  ?GAD 7 : Generalized Anxiety Score  ?Nervous, Anxious, on Edge 0 0 0 0  ?Control/stop worrying 0 0 0 0  ?Worry too much - different things 0 0 0 0  ?Trouble relaxing 0 0 0 0  ?Restless 0 0 0 0  ?Easily annoyed or irritable 0 0 0 1  ?Afraid - awful might happen 0 0 0 0  ?Total GAD 7 Score 0 0 0 1  ?Anxiety Difficulty  Not difficult at all    ? ? ? ?  12/20/2021  ? 10:59 AM  ?Depression screen PHQ 2/9  ?Decreased Interest 0  ?Down, Depressed, Hopeless 0  ?PHQ - 2 Score 0  ?Altered sleeping 0  ?Tired, decreased energy 0  ?Change in appetite 0  ?Feeling bad or failure about yourself  0  ?Trouble concentrating 0  ?Moving slowly or fidgety/restless 0  ?Suicidal thoughts 0  ?PHQ-9 Score 0  ?Difficult doing work/chores Not difficult at all  ? ? ?BP Readings from Last 3 Encounters:  ?02/19/22 134/76  ?12/20/21 114/78  ?12/04/21 114/78  ? ? ?Physical Exam ?Vitals and nursing note reviewed.  ?Constitutional:   ?    General: She is not in acute distress. ?   Appearance: She is well-developed. She is not ill-appearing.  ?   Comments: Appear fatigued  ?HENT:  ?   Head: Normocephalic and atraumatic.  ?Cardiovascular:  ?   Rate and Rhythm: Normal rate and regular rhythm.  ?   Pulses: Normal pulses.  ?   Heart sounds: No murmur heard. ?Pulmonary:  ?   Effort: Pulmonary effort is normal. No respiratory distress.  ?   Breath sounds: No wheezing or rhonchi.  ?  Abdominal:  ?   General: Abdomen is flat.  ?   Palpations: Abdomen is soft. There is no mass.  ?   Tenderness: There is no abdominal tenderness. There is no guarding or rebound.  ?   Hernia: No hernia is present.  ?Musculoskeletal:  ?   Cervical back: Normal range of motion.  ?   Right lower leg: No edema.  ?   Left lower leg: No edema.  ?Lymphadenopathy:  ?   Cervical: No cervical adenopathy.  ?Skin: ?   General: Skin is warm and dry.  ?   Findings: No rash.  ?   Comments: Normal skin turgor  ?Neurological:  ?   General: No focal deficit present.  ?   Mental Status: She is alert and oriented to person, place, and time.  ?Psychiatric:     ?   Mood and Affect: Mood normal.     ?   Behavior: Behavior normal.  ? ? ?Wt Readings from Last 3 Encounters:  ?02/19/22 162 lb 9.6 oz (73.8 kg)  ?12/20/21 169 lb (76.7 kg)  ?12/04/21 170 lb (77.1 kg)  ? ? ?BP 134/76   Pulse 69   Ht 5' 2"  (1.575 m)   Wt 162 lb 9.6 oz (73.8 kg)   BMI 29.74 kg/m?  ? ?Assessment and Plan: ?1. Diarrhea, unspecified type ?Likely viral which is improving with supportive care. ?Continue fluids, advance diet ?Continue Bentyl ?- CBC with Differential/Platelet ?- dicyclomine (BENTYL) 10 MG capsule; Take 1 capsule (10 mg total) by mouth 4 (four) times daily -  before meals and at bedtime.  Dispense: 40 capsule; Refill: 0 ? ?2. Elevated liver function tests ?Will recheck labs and rule out infectious hepatitis  ?- Hepatitis, Acute ?- Comprehensive metabolic panel ?- Acute Viral Hepatitis (HAV, HBV, HCV) ? ? ?Partially  dictated using Editor, commissioning. Any errors are unintentional. ? ?Halina Maidens, MD ?[facility] ?[facility] ? ?02/19/2022 ? ? ? ? ? ?

## 2022-02-20 LAB — CBC WITH DIFFERENTIAL/PLATELET
Basophils Absolute: 0.1 10*3/uL (ref 0.0–0.2)
Basos: 1 %
EOS (ABSOLUTE): 0.2 10*3/uL (ref 0.0–0.4)
Eos: 2 %
Hematocrit: 41.4 % (ref 34.0–46.6)
Hemoglobin: 13.2 g/dL (ref 11.1–15.9)
Immature Grans (Abs): 0 10*3/uL (ref 0.0–0.1)
Immature Granulocytes: 0 %
Lymphocytes Absolute: 5.1 10*3/uL — ABNORMAL HIGH (ref 0.7–3.1)
Lymphs: 57 %
MCH: 28.7 pg (ref 26.6–33.0)
MCHC: 31.9 g/dL (ref 31.5–35.7)
MCV: 90 fL (ref 79–97)
Monocytes Absolute: 0.8 10*3/uL (ref 0.1–0.9)
Monocytes: 9 %
Neutrophils Absolute: 2.7 10*3/uL (ref 1.4–7.0)
Neutrophils: 31 %
Platelets: 409 10*3/uL (ref 150–450)
RBC: 4.6 x10E6/uL (ref 3.77–5.28)
RDW: 12.8 % (ref 11.7–15.4)
WBC: 8.8 10*3/uL (ref 3.4–10.8)

## 2022-02-20 LAB — COMPREHENSIVE METABOLIC PANEL
ALT: 27 IU/L (ref 0–32)
AST: 24 IU/L (ref 0–40)
Albumin/Globulin Ratio: 1.4 (ref 1.2–2.2)
Albumin: 4.1 g/dL (ref 3.8–4.8)
Alkaline Phosphatase: 62 IU/L (ref 44–121)
BUN/Creatinine Ratio: 14 (ref 12–28)
BUN: 10 mg/dL (ref 8–27)
Bilirubin Total: 0.3 mg/dL (ref 0.0–1.2)
CO2: 28 mmol/L (ref 20–29)
Calcium: 9.3 mg/dL (ref 8.7–10.3)
Chloride: 101 mmol/L (ref 96–106)
Creatinine, Ser: 0.74 mg/dL (ref 0.57–1.00)
Globulin, Total: 3 g/dL (ref 1.5–4.5)
Glucose: 97 mg/dL (ref 70–99)
Potassium: 4.5 mmol/L (ref 3.5–5.2)
Sodium: 143 mmol/L (ref 134–144)
Total Protein: 7.1 g/dL (ref 6.0–8.5)
eGFR: 87 mL/min/{1.73_m2} (ref >59)

## 2022-02-20 LAB — ACUTE VIRAL HEPATITIS (HAV, HBV, HCV)
HCV Ab: NONREACTIVE
Hep A IgM: NEGATIVE
Hep B C IgM: NEGATIVE
Hepatitis B Surface Ag: NEGATIVE

## 2022-02-20 LAB — HCV INTERPRETATION

## 2022-02-27 DIAGNOSIS — M19011 Primary osteoarthritis, right shoulder: Secondary | ICD-10-CM | POA: Diagnosis not present

## 2022-02-27 DIAGNOSIS — M7541 Impingement syndrome of right shoulder: Secondary | ICD-10-CM | POA: Diagnosis not present

## 2022-02-27 DIAGNOSIS — S46011A Strain of muscle(s) and tendon(s) of the rotator cuff of right shoulder, initial encounter: Secondary | ICD-10-CM | POA: Diagnosis not present

## 2022-02-27 DIAGNOSIS — M7501 Adhesive capsulitis of right shoulder: Secondary | ICD-10-CM | POA: Diagnosis not present

## 2022-02-27 DIAGNOSIS — M67811 Other specified disorders of synovium, right shoulder: Secondary | ICD-10-CM | POA: Diagnosis not present

## 2022-02-27 DIAGNOSIS — Z955 Presence of coronary angioplasty implant and graft: Secondary | ICD-10-CM | POA: Diagnosis not present

## 2022-02-27 DIAGNOSIS — M7521 Bicipital tendinitis, right shoulder: Secondary | ICD-10-CM | POA: Diagnosis not present

## 2022-02-27 DIAGNOSIS — M25511 Pain in right shoulder: Secondary | ICD-10-CM | POA: Diagnosis not present

## 2022-02-27 DIAGNOSIS — S46211A Strain of muscle, fascia and tendon of other parts of biceps, right arm, initial encounter: Secondary | ICD-10-CM | POA: Diagnosis not present

## 2022-02-27 DIAGNOSIS — G8918 Other acute postprocedural pain: Secondary | ICD-10-CM | POA: Diagnosis not present

## 2022-02-27 DIAGNOSIS — M75101 Unspecified rotator cuff tear or rupture of right shoulder, not specified as traumatic: Secondary | ICD-10-CM | POA: Diagnosis not present

## 2022-03-14 DIAGNOSIS — M25511 Pain in right shoulder: Secondary | ICD-10-CM | POA: Diagnosis not present

## 2022-03-19 DIAGNOSIS — Z9889 Other specified postprocedural states: Secondary | ICD-10-CM | POA: Diagnosis not present

## 2022-03-19 DIAGNOSIS — M25511 Pain in right shoulder: Secondary | ICD-10-CM | POA: Diagnosis not present

## 2022-04-01 DIAGNOSIS — M25511 Pain in right shoulder: Secondary | ICD-10-CM | POA: Diagnosis not present

## 2022-04-02 ENCOUNTER — Telehealth: Payer: Self-pay | Admitting: Internal Medicine

## 2022-04-02 NOTE — Telephone Encounter (Signed)
Copied from CRM [id number]. Topic: General - Other >> Apr 02, 2022  9:38 AM Lynford Citizen wrote: Reason for CRM: Pt wanted to know if Dr [name] would refill her "heart medicines" including metoprolol tartrate  stated she has not been able to contact her heart doctors to get an appt or refills. Pt did not want to set up an appt until she had confirmation Dr [name] would be able to write this Rx.

## 2022-04-02 NOTE — Telephone Encounter (Signed)
Called and left VM on patient home phone. Informed I can see in chart where Cardiology received her call today about medication refills. Informed her she needs to continue to see cardiology for these, and they may ask her to make an appt because she has not see them in a while.   - Slayden Mennenga

## 2022-04-08 DIAGNOSIS — M25511 Pain in right shoulder: Secondary | ICD-10-CM | POA: Diagnosis not present

## 2022-04-14 DIAGNOSIS — M25511 Pain in right shoulder: Secondary | ICD-10-CM | POA: Diagnosis not present

## 2022-04-14 DIAGNOSIS — Z9889 Other specified postprocedural states: Secondary | ICD-10-CM | POA: Diagnosis not present

## 2022-04-17 DIAGNOSIS — M25511 Pain in right shoulder: Secondary | ICD-10-CM | POA: Diagnosis not present

## 2022-04-17 DIAGNOSIS — Z9889 Other specified postprocedural states: Secondary | ICD-10-CM | POA: Diagnosis not present

## 2022-04-21 DIAGNOSIS — Z9889 Other specified postprocedural states: Secondary | ICD-10-CM | POA: Diagnosis not present

## 2022-04-21 DIAGNOSIS — M25511 Pain in right shoulder: Secondary | ICD-10-CM | POA: Diagnosis not present

## 2022-04-24 DIAGNOSIS — Z9889 Other specified postprocedural states: Secondary | ICD-10-CM | POA: Diagnosis not present

## 2022-04-24 DIAGNOSIS — M25511 Pain in right shoulder: Secondary | ICD-10-CM | POA: Diagnosis not present

## 2022-04-25 DIAGNOSIS — E7800 Pure hypercholesterolemia, unspecified: Secondary | ICD-10-CM | POA: Diagnosis not present

## 2022-04-25 DIAGNOSIS — E782 Mixed hyperlipidemia: Secondary | ICD-10-CM | POA: Diagnosis not present

## 2022-04-25 DIAGNOSIS — I2119 ST elevation (STEMI) myocardial infarction involving other coronary artery of inferior wall: Secondary | ICD-10-CM | POA: Diagnosis not present

## 2022-04-28 DIAGNOSIS — Z9889 Other specified postprocedural states: Secondary | ICD-10-CM | POA: Diagnosis not present

## 2022-04-28 DIAGNOSIS — M25511 Pain in right shoulder: Secondary | ICD-10-CM | POA: Diagnosis not present

## 2022-05-07 DIAGNOSIS — Z9889 Other specified postprocedural states: Secondary | ICD-10-CM | POA: Diagnosis not present

## 2022-05-07 DIAGNOSIS — M25511 Pain in right shoulder: Secondary | ICD-10-CM | POA: Diagnosis not present

## 2022-05-14 DIAGNOSIS — M25511 Pain in right shoulder: Secondary | ICD-10-CM | POA: Diagnosis not present

## 2022-05-14 DIAGNOSIS — Z9889 Other specified postprocedural states: Secondary | ICD-10-CM | POA: Diagnosis not present

## 2022-05-15 DIAGNOSIS — M25551 Pain in right hip: Secondary | ICD-10-CM | POA: Diagnosis not present

## 2022-05-15 DIAGNOSIS — M25552 Pain in left hip: Secondary | ICD-10-CM | POA: Diagnosis not present

## 2022-05-21 DIAGNOSIS — Z9889 Other specified postprocedural states: Secondary | ICD-10-CM | POA: Diagnosis not present

## 2022-05-21 DIAGNOSIS — M25511 Pain in right shoulder: Secondary | ICD-10-CM | POA: Diagnosis not present

## 2022-05-26 DIAGNOSIS — M25511 Pain in right shoulder: Secondary | ICD-10-CM | POA: Diagnosis not present

## 2022-05-26 DIAGNOSIS — Z9889 Other specified postprocedural states: Secondary | ICD-10-CM | POA: Diagnosis not present

## 2022-06-02 DIAGNOSIS — S76111A Strain of right quadriceps muscle, fascia and tendon, initial encounter: Secondary | ICD-10-CM | POA: Diagnosis not present

## 2022-06-02 DIAGNOSIS — M25511 Pain in right shoulder: Secondary | ICD-10-CM | POA: Diagnosis not present

## 2022-06-02 DIAGNOSIS — Z9889 Other specified postprocedural states: Secondary | ICD-10-CM | POA: Diagnosis not present

## 2022-06-09 DIAGNOSIS — M25511 Pain in right shoulder: Secondary | ICD-10-CM | POA: Diagnosis not present

## 2022-06-09 DIAGNOSIS — Z9889 Other specified postprocedural states: Secondary | ICD-10-CM | POA: Diagnosis not present

## 2022-06-17 DIAGNOSIS — Z9889 Other specified postprocedural states: Secondary | ICD-10-CM | POA: Diagnosis not present

## 2022-06-17 DIAGNOSIS — M25511 Pain in right shoulder: Secondary | ICD-10-CM | POA: Diagnosis not present

## 2022-06-24 DIAGNOSIS — M25511 Pain in right shoulder: Secondary | ICD-10-CM | POA: Diagnosis not present

## 2022-06-24 DIAGNOSIS — Z9889 Other specified postprocedural states: Secondary | ICD-10-CM | POA: Diagnosis not present

## 2022-06-30 DIAGNOSIS — M25511 Pain in right shoulder: Secondary | ICD-10-CM | POA: Diagnosis not present

## 2022-07-08 DIAGNOSIS — M25511 Pain in right shoulder: Secondary | ICD-10-CM | POA: Diagnosis not present

## 2022-07-08 DIAGNOSIS — Z9889 Other specified postprocedural states: Secondary | ICD-10-CM | POA: Diagnosis not present

## 2022-07-21 DIAGNOSIS — Z9889 Other specified postprocedural states: Secondary | ICD-10-CM | POA: Diagnosis not present

## 2022-07-21 DIAGNOSIS — M25511 Pain in right shoulder: Secondary | ICD-10-CM | POA: Diagnosis not present

## 2022-07-28 DIAGNOSIS — M25511 Pain in right shoulder: Secondary | ICD-10-CM | POA: Diagnosis not present

## 2022-07-28 DIAGNOSIS — Z9889 Other specified postprocedural states: Secondary | ICD-10-CM | POA: Diagnosis not present

## 2022-08-04 DIAGNOSIS — Z9889 Other specified postprocedural states: Secondary | ICD-10-CM | POA: Diagnosis not present

## 2022-08-04 DIAGNOSIS — M25511 Pain in right shoulder: Secondary | ICD-10-CM | POA: Diagnosis not present

## 2022-08-11 DIAGNOSIS — M25511 Pain in right shoulder: Secondary | ICD-10-CM | POA: Diagnosis not present

## 2022-08-13 DIAGNOSIS — M25511 Pain in right shoulder: Secondary | ICD-10-CM | POA: Diagnosis not present

## 2022-08-14 ENCOUNTER — Ambulatory Visit: Payer: Self-pay

## 2022-08-14 NOTE — Telephone Encounter (Signed)
Message from Scherrie Gerlach sent at 08/14/2022  8:26 AM EDT  Summary: sore throat/raw   Pt started w/ a scratchy throat, but yesterday it started to get a sore throat, and it feels raw when she swollows down into chest.  Wanted to see Dr Carolin Coy, but no appts.  Please advise         Chief Complaint: sore throat Symptoms: nasal congestion and drainage, coughing up yellow phlegm, watery eyes, headache or rash Frequency: yesterday Pertinent Negatives: Patient denies SOB,  Disposition: [] ED /[] Urgent Care (no appt availability in office) / [x] Appointment(In office/virtual)/ []  [place] [facility]/ [] Home Care/ [] Refused Recommended Disposition /[] [place] [facility]/ []  Follow-up with PCP Additional Notes: appt scheduled with PCP tomorrow am   Reason for Disposition  SEVERE (e.g., excruciating) throat pain  Answer Assessment - Initial Assessment Questions 1. ONSET: "When did the throat start hurting?" (Hours or days ago)      Yesterday 2. SEVERITY: "How bad is the sore throat?" (Scale 1-10; mild, moderate or severe)   - MILD (1-3):  Doesn't interfere with eating or normal activities.   - MODERATE (4-7): Interferes with eating some solids and normal activities.   - SEVERE (8-10):  Excruciating pain, interferes with most normal activities.   - SEVERE WITH DYSPHAGIA (10): Can't swallow liquids, drooling.     severe 3. STREP EXPOSURE: "Has there been any exposure to strep within the past week?" If Yes, ask: "What type of contact occurred?"      no 4.  VIRAL SYMPTOMS: "Are there any symptoms of a cold, such as a runny nose, cough, hoarse voice or red eyes?"      Feels like moving down to chest, cough, runny nose, sinus drainage 5. FEVER: "Do you have a fever?" If Yes, ask: "What is your temperature, how was it measured, and when did it start?"     Off and on -  6. PUS ON THE TONSILS: "Is there pus on the tonsils in the back of your throat?"     Cannot look 7. OTHER SYMPTOMS: "Do  you have any other symptoms?" (e.g., difficulty breathing, headache, rash)     Eye water, clear nasal discharge- coughing up yellow 8. PREGNANCY: "Is there any chance you are pregnant?" "When was your last menstrual period?"     N/a  Protocols used: Sore Throat-A-AH

## 2022-08-15 ENCOUNTER — Encounter: Payer: Self-pay | Admitting: Internal Medicine

## 2022-08-15 ENCOUNTER — Ambulatory Visit: Payer: Medicare PPO | Admitting: Internal Medicine

## 2022-08-15 VITALS — BP 126/76 | HR 77 | Temp 98.2°F | Ht 62.0 in | Wt 172.0 lb

## 2022-08-15 DIAGNOSIS — M12811 Other specific arthropathies, not elsewhere classified, right shoulder: Secondary | ICD-10-CM

## 2022-08-15 DIAGNOSIS — J0100 Acute maxillary sinusitis, unspecified: Secondary | ICD-10-CM

## 2022-08-15 DIAGNOSIS — M75101 Unspecified rotator cuff tear or rupture of right shoulder, not specified as traumatic: Secondary | ICD-10-CM

## 2022-08-15 DIAGNOSIS — R059 Cough, unspecified: Secondary | ICD-10-CM | POA: Diagnosis not present

## 2022-08-15 LAB — POC COVID19 BINAXNOW: SARS Coronavirus 2 Ag: NEGATIVE

## 2022-08-15 MED ORDER — DOXYCYCLINE HYCLATE 100 MG PO TABS: 100.0000 mg | ORAL_TABLET | Freq: Two times a day (BID) | ORAL | 0 refills | Status: AC

## 2022-08-15 MED ORDER — GUAIFENESIN-CODEINE 100-10 MG/5ML PO SYRP: 5.0000 mL | ORAL_SOLUTION | Freq: Three times a day (TID) | ORAL | 0 refills | Status: DC | PRN

## 2022-08-15 NOTE — Patient Instructions (Signed)
[place]

## 2022-08-15 NOTE — Progress Notes (Signed)
Date:  08/15/2022   Name:  Jordan Baxter   DOB:  Sep 05, 1951   MRN:  [id number]   Chief Complaint: Sore Throat  Sore Throat  This is a new problem. Episode onset: X4 days. The problem has been rapidly worsening. The pain is worse on the right side. There has been no fever. The pain is at a severity of 2/10. The pain is mild. Associated symptoms include congestion, coughing, headaches, a hoarse voice and trouble swallowing. Pertinent negatives include no ear pain or shortness of breath. She has tried gargles and acetaminophen for the symptoms. The treatment provided mild relief.  Sinus Problem This is a recurrent problem. Episode onset: 5 days ago. The problem has been gradually worsening since onset. There has been no fever. Associated symptoms include congestion, coughing, headaches, a hoarse voice, sinus pressure and a sore throat. Pertinent negatives include no chills, ear pain or shortness of breath. Treatments tried: antihistamines. The treatment provided no relief.    Lab Results  Component Value Date   NA 143 02/19/2022   K 4.5 02/19/2022   CO2 28 02/19/2022   GLUCOSE 97 02/19/2022   BUN 10 02/19/2022   CREATININE 0.74 02/19/2022   CALCIUM 9.3 02/19/2022   EGFR 87 02/19/2022   GFRNONAA 80 11/29/2020   Lab Results  Component Value Date   CHOL 161 12/04/2021   HDL 62 12/04/2021   LDLCALC 73 12/04/2021   TRIG 156 (H) 12/04/2021   CHOLHDL 2.6 12/04/2021   Lab Results  Component Value Date   TSH 1.490 12/04/2021   Lab Results  Component Value Date   HGBA1C 6.5 (H) 12/04/2021   Lab Results  Component Value Date   WBC 8.8 02/19/2022   HGB 13.2 02/19/2022   HCT 41.4 02/19/2022   MCV 90 02/19/2022   PLT 409 02/19/2022   Lab Results  Component Value Date   ALT 27 02/19/2022   AST 24 02/19/2022   ALKPHOS 62 02/19/2022   BILITOT 0.3 02/19/2022   No results found for: "25OHVITD2", "25OHVITD3", "VD25OH"   Review of Systems  Constitutional:  Negative for chills,  fatigue and fever.  HENT:  Positive for congestion, hoarse voice, postnasal drip, sinus pressure, sore throat and trouble swallowing. Negative for ear pain.   Respiratory:  Positive for cough. Negative for chest tightness, shortness of breath and wheezing.   Neurological:  Positive for headaches.  Psychiatric/Behavioral:  Negative for dysphoric mood and sleep disturbance. The patient is not nervous/anxious.     Patient Active Problem List   Diagnosis Date Noted   Rotator cuff tear arthropathy of right shoulder 12/04/2021   Trochanteric bursitis of left hip 09/11/2021   Cervical radiculopathy 09/11/2021   BMI 32.0-32.9,adult 05/21/2016   Tobacco use disorder, moderate, in sustained remission 04/23/2016   H/O cardiac catheterization 03/20/2015   Coronary artery disease involving native heart with angina pectoris ([place]) 03/19/2015   Essential (primary) hypertension 03/19/2015   Fibrositis 03/19/2015   Gastro-esophageal reflux disease without esophagitis 03/19/2015   Hyperlipidemia, mixed 03/19/2015   H/O arthrodesis 03/19/2015    Allergies  Allergen Reactions   Azithromycin Diarrhea   Cephalosporins    Ibuprofen Nausea Only   Nsaids Other (See Comments)   Penicillins Other (See Comments)   Sulfa Antibiotics Photosensitivity    Past Surgical History:  Procedure Laterality Date   ABDOMINAL HYSTERECTOMY  1986   BACK SURGERY  08/20/2020   L4 L5 S1 nerve [facility]   CARPAL TUNNEL RELEASE Right  CERVICAL FUSION  2015   CESAREAN SECTION  1984   COLONOSCOPY WITH ESOPHAGOGASTRODUODENOSCOPY ([facility]GD)  08/31/2018   Dr. Epimenio Foot   KNEE ARTHROSCOPY Right    PARTIAL HYSTERECTOMY     ROTATOR CUFF REPAIR Left     Social History   Tobacco Use   Smoking status: Former    Packs/day: 0.50    Years: 45.00    Total pack years: 22.50    Types: Cigarettes    Quit date: 03/13/2012    Years since quitting: 10.4   Smokeless tobacco: Never  Vaping Use   Vaping Use: Never used   Substance Use Topics   Alcohol use: No    Alcohol/week: 0.0 standard drinks of alcohol   Drug use: No     Medication list has been reviewed and updated.  Current Meds  Medication Sig   Acetaminophen (ARTHRITIS PAIN PO) Take by mouth daily.   metoprolol tartrate (LOPRESSOR) 25 MG tablet Take 25 mg by mouth 2 (two) times daily.    nitroGLYCERIN (NITROSTAT) 0.4 MG SL tablet Place 1 tablet under the tongue daily as needed.   omeprazole (PRILOSEC) 20 MG capsule TAKE 1 CAPSULE TWICE DAILY BEFORE MEALS   rosuvastatin (CRESTOR) 40 MG tablet Take 1 tablet by mouth daily.   triamterene-hydrochlorothiazide (MAXZIDE-25) 37.5-25 MG tablet TAKE 1 TABLET EVERY DAY   [DISCONTINUED] dicyclomine (BENTYL) 10 MG capsule Take 1 capsule (10 mg total) by mouth 4 (four) times daily -  before meals and at bedtime.       08/15/2022   11:29 AM 02/19/2022   10:44 AM 12/20/2021   10:59 AM 12/04/2021   10:08 AM  GAD 7 : Generalized Anxiety Score  Nervous, Anxious, on Edge 0 0 0 0  Control/stop worrying 0 0 0 0  Worry too much - different things 0 0 0 0  Trouble relaxing 0 0 0 0  Restless 0 0 0 0  Easily annoyed or irritable 0 0 0 0  Afraid - awful might happen 0 0 0 0  Total GAD 7 Score 0 0 0 0  Anxiety Difficulty Not difficult at all Not difficult at all  Not difficult at all       08/15/2022   11:29 AM 02/19/2022   10:44 AM 12/20/2021   10:59 AM  Depression screen PHQ 2/9  Decreased Interest 0 0 0  Down, Depressed, Hopeless 0 0 0  PHQ - 2 Score 0 0 0  Altered sleeping 0 0 0  Tired, decreased energy 0 2 0  Change in appetite 0 0 0  Feeling bad or failure about yourself  0 0 0  Trouble concentrating 0 0 0  Moving slowly or fidgety/restless 0 0 0  Suicidal thoughts 0 0 0  PHQ-9 Score 0 2 0  Difficult doing work/chores Not difficult at all Not difficult at all Not difficult at all    BP Readings from Last 3 Encounters:  08/15/22 126/76  02/19/22 134/76  12/20/21 114/78    Physical  Exam Constitutional:      Appearance: She is well-developed.  HENT:     Right Ear: Ear canal and external ear normal. Tympanic membrane is not erythematous or retracted.     Left Ear: Ear canal and external ear normal. Tympanic membrane is not erythematous or retracted.     Nose:     Right Sinus: Maxillary sinus tenderness present. No frontal sinus tenderness.     Left Sinus: Maxillary sinus tenderness present. No frontal sinus tenderness.  Mouth/Throat:     Mouth: No oral lesions.     Pharynx: Uvula midline. Posterior oropharyngeal erythema (and thick yellow mucoid drainage) present. No oropharyngeal exudate.     Tonsils: No tonsillar exudate.  Eyes:     Conjunctiva/sclera: Conjunctivae normal.  Cardiovascular:     Rate and Rhythm: Normal rate and regular rhythm.     Heart sounds: Normal heart sounds.  Pulmonary:     Breath sounds: Normal breath sounds. No wheezing or rales.  Lymphadenopathy:     Cervical: No cervical adenopathy.  Neurological:     Mental Status: She is alert and oriented to person, place, and time.     Wt Readings from Last 3 Encounters:  08/15/22 172 lb (78 kg)  02/19/22 162 lb 9.6 oz (73.8 kg)  12/20/21 169 lb (76.7 kg)    BP 126/76   Pulse 77   Temp 98.2 F (36.8 C) (Oral)   Ht _0  (1.575 m)   Wt 172 lb (78 kg)   SpO2 95%   BMI 31.46 kg/m   Assessment and Plan: 1. Acute non-recurrent maxillary sinusitis Continue non sedating antihistamines otc Use Robitussin AC at bedtime - doxycycline (VIBRA-TABS) 100 MG tablet; Take 1 tablet (100 mg total) by mouth 2 (two) times daily for 10 days.  Dispense: 20 tablet; Refill: 0  2. Cough, unspecified type - POC COVID-19 - negative - guaiFENesin-codeine (ROBITUSSIN AC) 100-10 MG/5ML syrup; Take 5 mLs by mouth 3 (three) times daily as needed for cough.  Dispense: 118 mL; Refill: 0   Partially dictated using Editor, commissioning. Any errors are unintentional.  Halina Maidens, MD [facility]  08/15/2022

## 2022-08-25 DIAGNOSIS — M25511 Pain in right shoulder: Secondary | ICD-10-CM | POA: Diagnosis not present

## 2022-08-27 ENCOUNTER — Ambulatory Visit: Payer: Self-pay | Admitting: *Deleted

## 2022-08-27 NOTE — Telephone Encounter (Signed)
  Chief Complaint: Seen on 08/15/2022 for URI and coughing.    Still coughing a lot.   Has shoulder surgery coming up Dec. 7, 2023.   Wants to get rid of the cough and congestion before then.   Would Dr. [name] be willing to call in something else for me? Symptoms: Coughing up white, clear mucus and blowing the same from her nose.   Throat is scratchy too. Frequency: Hard to sleep at night due to coughing  Pertinent Negatives: Patient denies that the cough ever went away. Disposition: [] ED /[] Urgent Care (no appt availability in office) / [] Appointment(In office/virtual)/ []  [place] [facility]/ [] Home Care/ [] Refused Recommended Disposition /[] [place] [facility]/ [x]  Follow-up with PCP Additional Notes: Message sent to Dr. .   If something is called in wants it called to [facility].

## 2022-08-27 NOTE — Telephone Encounter (Signed)
Called and informed pt of Der [name] saying to give more time. Pt said she will. Explained if she starts getting worse to come back and see [place] and she said she will.  Jordan Baxter

## 2022-08-27 NOTE — Telephone Encounter (Signed)
Message from [place] sent at 08/27/2022  9:23 AM EST  Summary: bad cough   Patient called in says still has bad cough, even after taking antibiotics. She didn't want to make another appt, because she had on on 11/03.          Call History   Type Contact Phone/Fax User  08/27/2022 09:21 AM EST Phone (Incoming) Jordan Olive "Alvino Chapel" (Self) [phone] Rexene Edison) Eliseo Gum, [place]   Reason for Disposition  [1] Continuous (nonstop) coughing interferes with work or school AND [2] no improvement using cough treatment per Care Advice    For shoulder surgery on Dec. 7, 2023  Wanting to get rid of cough and congestion.  Answer Assessment - Initial Assessment Questions 1. ONSET: "When did the cough begin?"      Coughing up thick clear white mucus.   Blowing clear white from nose too.   Pt is for shoulder surgery on Dec. 7, 2023 so really concerned about getting rid of this cough and congestion.  My throat is scratchy.   I finished antibiotic on Monday.   I'm taking allergy medicine OTC.    I get these allergies every year.   I waited too long this year to start medication and now I'm fighting coughing and bronchitis.  2. SEVERITY: "How bad is the cough today?"      I've not stopped coughing. Hard to sleep at night due to coughing. I had shoulder surgery in May.   Dec. 7 I'm having another shoulder surgery.    3. SPUTUM: "Describe the color of your sputum" (none, dry cough; clear, white, yellow, green)     White clear mucus from nose and being coughed up 4. HEMOPTYSIS: "Are you coughing up any blood?" If so ask: "How much?" (flecks, streaks, tablespoons, etc.)     No 5. DIFFICULTY BREATHING: "Are you having difficulty breathing?" If Yes, ask: "How bad is it?" (e.g., mild, moderate, severe)    - MILD: No SOB at rest, mild SOB with walking, speaks normally in sentences, can lie down, no retractions, pulse < 100.    - MODERATE: SOB at rest, SOB with minimal exertion and prefers to sit,  cannot lie down flat, speaks in phrases, mild retractions, audible wheezing, pulse 100-120.    - SEVERE: Very SOB at rest, speaks in single words, struggling to breathe, sitting hunched forward, retractions, pulse > 120      No 6. FEVER: "Do you have a fever?" If Yes, ask: "What is your temperature, how was it measured, and when did it start?"     No 7. CARDIAC HISTORY: "Do you have any history of heart disease?" (e.g., heart attack, congestive heart failure)      Not asked 8. LUNG HISTORY: "Do you have any history of lung disease?"  (e.g., pulmonary embolus, asthma, emphysema)     Not asked 9. PE RISK FACTORS: "Do you have a history of blood clots?" (or: recent major surgery, recent prolonged travel, bedridden)     Not asked 10. OTHER SYMPTOMS: "Do you have any other symptoms?" (e.g., runny nose, wheezing, chest pain)       See above 11. PREGNANCY: "Is there any chance you are pregnant?" "When was your last menstrual period?"       N/A due to age 71. TRAVEL: "Have you traveled out of the country in the last month?" (e.g., travel history, exposures)       N/A  Protocols used: Cough - Acute Productive-A-AH

## 2022-09-08 ENCOUNTER — Ambulatory Visit: Payer: Medicare PPO | Admitting: Internal Medicine

## 2022-09-08 ENCOUNTER — Encounter: Payer: Self-pay | Admitting: Internal Medicine

## 2022-09-08 ENCOUNTER — Ambulatory Visit
Admission: RE | Admit: 2022-09-08 | Discharge: 2022-09-08 | Disposition: A | Discharge status: Home / Self Care | Payer: Medicare PPO | Source: Ambulatory Visit | Attending: Internal Medicine | Admitting: Internal Medicine

## 2022-09-08 ENCOUNTER — Ambulatory Visit: Payer: Medicare PPO

## 2022-09-08 ENCOUNTER — Ambulatory Visit
Admission: RE | Admit: 2022-09-08 | Discharge: 2022-09-08 | Disposition: A | Discharge status: Home / Self Care | Payer: Medicare PPO | Attending: Internal Medicine | Admitting: Internal Medicine

## 2022-09-08 VITALS — BP 116/70 | HR 72 | Temp 97.3°F | Ht 62.0 in | Wt 170.0 lb

## 2022-09-08 DIAGNOSIS — R052 Subacute cough: Secondary | ICD-10-CM

## 2022-09-08 DIAGNOSIS — R059 Cough, unspecified: Secondary | ICD-10-CM | POA: Diagnosis not present

## 2022-09-08 MED ORDER — GUAIFENESIN-CODEINE 100-10 MG/5ML PO SYRP: 5.0000 mL | ORAL_SOLUTION | Freq: Three times a day (TID) | ORAL | 0 refills | Status: DC | PRN

## 2022-09-08 MED ORDER — LEVOFLOXACIN 500 MG PO TABS: 500.0000 mg | ORAL_TABLET | Freq: Every day | ORAL | 0 refills | Status: AC

## 2022-09-08 NOTE — Progress Notes (Signed)
Date:  09/08/2022   Name:  Jordan Baxter   DOB:  1951/07/03   MRN:  [id number]   Chief Complaint: Cough (Ongoing now for 4 weeks. Bringing up thick white mucous. No fever. Some SOB with activity. )  Cough This is a new problem. The current episode started 1 to 4 weeks ago. The problem has been gradually worsening. The problem occurs every few minutes. The cough is Productive of sputum. Pertinent negatives include no chest pain, chills, fever, headaches, shortness of breath or wheezing. The symptoms are aggravated by cold air, exercise and lying down.    Lab Results  Component Value Date   NA 143 02/19/2022   K 4.5 02/19/2022   CO2 28 02/19/2022   GLUCOSE 97 02/19/2022   BUN 10 02/19/2022   CREATININE 0.74 02/19/2022   CALCIUM 9.3 02/19/2022   EGFR 87 02/19/2022   GFRNONAA 80 11/29/2020   Lab Results  Component Value Date   CHOL 161 12/04/2021   HDL 62 12/04/2021   LDLCALC 73 12/04/2021   TRIG 156 (H) 12/04/2021   CHOLHDL 2.6 12/04/2021   Lab Results  Component Value Date   TSH 1.490 12/04/2021   Lab Results  Component Value Date   HGBA1C 6.5 (H) 12/04/2021   Lab Results  Component Value Date   WBC 8.8 02/19/2022   HGB 13.2 02/19/2022   HCT 41.4 02/19/2022   MCV 90 02/19/2022   PLT 409 02/19/2022   Lab Results  Component Value Date   ALT 27 02/19/2022   AST 24 02/19/2022   ALKPHOS 62 02/19/2022   BILITOT 0.3 02/19/2022   No results found for: "25OHVITD2", "25OHVITD3", "VD25OH"   Review of Systems  Constitutional:  Negative for chills and fever.  Respiratory:  Positive for cough. Negative for shortness of breath and wheezing.   Cardiovascular:  Negative for chest pain and palpitations.  Musculoskeletal:  Positive for arthralgias (shoulder surgery on 12/7).  Neurological:  Negative for dizziness and headaches.    Patient Active Problem List   Diagnosis Date Noted   Rotator cuff tear arthropathy of right shoulder 12/04/2021   Trochanteric bursitis of  left hip 09/11/2021   Cervical radiculopathy 09/11/2021   BMI 32.0-32.9,adult 05/21/2016   Tobacco use disorder, moderate, in sustained remission 04/23/2016   H/O cardiac catheterization 03/20/2015   Coronary artery disease involving native heart with angina pectoris ([place]) 03/19/2015   Essential (primary) hypertension 03/19/2015   Fibrositis 03/19/2015   Gastro-esophageal reflux disease without esophagitis 03/19/2015   Hyperlipidemia, mixed 03/19/2015   H/O arthrodesis 03/19/2015    Allergies  Allergen Reactions   Azithromycin Diarrhea   Cephalosporins    Ibuprofen Nausea Only   Nsaids Other (See Comments)   Penicillins Other (See Comments)   Sulfa Antibiotics Photosensitivity    Past Surgical History:  Procedure Laterality Date   ABDOMINAL HYSTERECTOMY  1986   BACK SURGERY  08/20/2020   L4 L5 S1 nerve [facility]   CARPAL TUNNEL RELEASE Right    CERVICAL FUSION  2015   CESAREAN SECTION  1984   COLONOSCOPY WITH ESOPHAGOGASTRODUODENOSCOPY ([facility])  08/31/2018   Dr. Epimenio Foot   KNEE ARTHROSCOPY Right    PARTIAL HYSTERECTOMY     ROTATOR CUFF REPAIR Left    ROTATOR CUFF REPAIR W/ DISTAL CLAVICLE EXCISION Right 11/2021    Social History   Tobacco Use   Smoking status: Former    Packs/day: 0.50    Years: 45.00    Total pack years: 22.50  Types: Cigarettes    Quit date: 03/13/2012    Years since quitting: 10.4   Smokeless tobacco: Never  Vaping Use   Vaping Use: Never used  Substance Use Topics   Alcohol use: No    Alcohol/week: 0.0 standard drinks of alcohol   Drug use: No     Medication list has been reviewed and updated.  Current Meds  Medication Sig   Acetaminophen (ARTHRITIS PAIN PO) Take by mouth daily.   metoprolol tartrate (LOPRESSOR) 25 MG tablet Take 25 mg by mouth 2 (two) times daily.    nitroGLYCERIN (NITROSTAT) 0.4 MG SL tablet Place 1 tablet under the tongue daily as needed.   omeprazole (PRILOSEC) 20 MG capsule TAKE 1 CAPSULE TWICE DAILY  BEFORE MEALS   rosuvastatin (CRESTOR) 40 MG tablet Take 1 tablet by mouth daily.   triamterene-hydrochlorothiazide (MAXZIDE-25) 37.5-25 MG tablet TAKE 1 TABLET EVERY DAY       08/15/2022   11:29 AM 02/19/2022   10:44 AM 12/20/2021   10:59 AM 12/04/2021   10:08 AM  GAD 7 : Generalized Anxiety Score  Nervous, Anxious, on Edge 0 0 0 0  Control/stop worrying 0 0 0 0  Worry too much - different things 0 0 0 0  Trouble relaxing 0 0 0 0  Restless 0 0 0 0  Easily annoyed or irritable 0 0 0 0  Afraid - awful might happen 0 0 0 0  Total GAD 7 Score 0 0 0 0  Anxiety Difficulty Not difficult at all Not difficult at all  Not difficult at all       08/15/2022   11:29 AM 02/19/2022   10:44 AM 12/20/2021   10:59 AM  Depression screen PHQ 2/9  Decreased Interest 0 0 0  Down, Depressed, Hopeless 0 0 0  PHQ - 2 Score 0 0 0  Altered sleeping 0 0 0  Tired, decreased energy 0 2 0  Change in appetite 0 0 0  Feeling bad or failure about yourself  0 0 0  Trouble concentrating 0 0 0  Moving slowly or fidgety/restless 0 0 0  Suicidal thoughts 0 0 0  PHQ-9 Score 0 2 0  Difficult doing work/chores Not difficult at all Not difficult at all Not difficult at all    BP Readings from Last 3 Encounters:  09/08/22 116/70  08/15/22 126/76  02/19/22 134/76    Physical Exam Vitals and nursing note reviewed.  Constitutional:      General: She is not in acute distress.    Appearance: Normal appearance. She is well-developed.  HENT:     Head: Normocephalic and atraumatic.  Cardiovascular:     Rate and Rhythm: Normal rate and regular rhythm.     Pulses: Normal pulses.     Heart sounds: No murmur heard. Pulmonary:     Effort: Pulmonary effort is normal. No respiratory distress.     Breath sounds: No wheezing or rhonchi.  Musculoskeletal:     Cervical back: Normal range of motion.     Right lower leg: No edema.     Left lower leg: No edema.  Lymphadenopathy:     Cervical: No cervical adenopathy.   Skin:    General: Skin is warm and dry.     Findings: No rash.  Neurological:     Mental Status: She is alert and oriented to person, place, and time.  Psychiatric:        Mood and Affect: Mood normal.  Behavior: Behavior normal.     Wt Readings from Last 3 Encounters:  09/08/22 170 lb (77.1 kg)  08/15/22 172 lb (78 kg)  02/19/22 162 lb 9.6 oz (73.8 kg)    BP 116/70   Pulse 72   Temp (!) 97.3 F (36.3 C) (Oral)   Ht _0  (1.575 m)   Wt 170 lb (77.1 kg)   SpO2 92%   BMI 31.09 kg/m   Assessment and Plan: 1. Subacute cough S/p Doxycycline for sinusitis Will get CXR since surgery is upcoming and treat presumptively - DG Chest 2 View - levofloxacin (LEVAQUIN) 500 MG tablet; Take 1 tablet (500 mg total) by mouth daily for 7 days.  Dispense: 7 tablet; Refill: 0 - guaiFENesin-codeine (ROBITUSSIN AC) 100-10 MG/5ML syrup; Take 5 mLs by mouth 3 (three) times daily as needed for cough.  Dispense: 118 mL; Refill: 0   Partially dictated using Editor, commissioning. Any errors are unintentional.  Halina Maidens, MD [facility]  09/08/2022

## 2022-09-10 ENCOUNTER — Ambulatory Visit (INDEPENDENT_AMBULATORY_CARE_PROVIDER_SITE_OTHER): Payer: Medicare PPO

## 2022-09-10 ENCOUNTER — Ambulatory Visit: Payer: Medicare PPO

## 2022-09-10 VITALS — Ht 62.0 in | Wt 170.0 lb

## 2022-09-10 DIAGNOSIS — Z0000 Encounter for general adult medical examination without abnormal findings: Secondary | ICD-10-CM

## 2022-09-10 NOTE — Progress Notes (Signed)
Subjective:   Margaretta Chittum is a 71 y.o. female who presents for Medicare Annual (Subsequent) preventive examination.  I connected with  [name] on 09/10/22 by a audio enabled telemedicine application and verified that I am speaking with the correct person using two identifiers.  Patient Location: Home  Provider Location: Office/Clinic  I discussed the limitations of evaluation and management by telemedicine. The patient expressed understanding and agreed to proceed.    Review of Systems    Defer to PCP Cardiac Risk Factors include: advanced age (>10men, >62 women)     Objective:    Today's Vitals   09/10/22 0825  Weight: 170 lb (77.1 kg)  Height: 5\' 2"  (1.575 m)  PainSc: 2    Body mass index is 31.09 kg/m.     09/10/2022    8:28 AM 09/09/2021    8:10 AM 09/03/2020    8:29 AM 08/31/2019    8:23 AM 07/30/2017    9:53 AM  Advanced Directives  Does Patient Have a Medical Advance Directive? Yes Yes Yes Yes Yes  Type of Advance Directive Living will Healthcare Power of [place];Living will Healthcare Power of [place];Living will Healthcare Power of [place];Living will Healthcare Power of [place];Living will  Copy of Healthcare Power of Attorney in Chart?  No - copy requested No - copy requested No - copy requested No - copy requested    Current Medications (verified) Outpatient Encounter Medications as of 09/10/2022  Medication Sig   Acetaminophen (ARTHRITIS PAIN PO) Take by mouth daily.   guaiFENesin-codeine (ROBITUSSIN AC) 100-10 MG/5ML syrup Take 5 mLs by mouth 3 (three) times daily as needed for cough.   levofloxacin (LEVAQUIN) 500 MG tablet Take 1 tablet (500 mg total) by mouth daily for 7 days.   metoprolol tartrate (LOPRESSOR) 25 MG tablet Take 25 mg by mouth 2 (two) times daily.    nitroGLYCERIN (NITROSTAT) 0.4 MG SL tablet Place 1 tablet under the tongue daily as needed.   omeprazole (PRILOSEC) 20 MG capsule TAKE 1 CAPSULE TWICE DAILY BEFORE MEALS    rosuvastatin (CRESTOR) 40 MG tablet Take 1 tablet by mouth daily.   triamterene-hydrochlorothiazide (MAXZIDE-25) 37.5-25 MG tablet TAKE 1 TABLET EVERY DAY   No facility-administered encounter medications on file as of 09/10/2022.    Allergies (verified) Azithromycin, Cephalosporins, Ibuprofen, Nsaids, Penicillins, and Sulfa antibiotics   History: Past Medical History:  Diagnosis Date   Allergy    Arthritis    Fibrositis    GERD (gastroesophageal reflux disease)    Hyperlipidemia    Hypertension    Past Surgical History:  Procedure Laterality Date   ABDOMINAL HYSTERECTOMY  1986   BACK SURGERY  08/20/2020   L4 L5 S1 nerve [facility]   CARPAL TUNNEL RELEASE Right    CERVICAL FUSION  2015   CESAREAN SECTION  1984   COLONOSCOPY WITH ESOPHAGOGASTRODUODENOSCOPY (EGD)  08/31/2018   Dr. 09/02/2018   KNEE ARTHROSCOPY Right    PARTIAL HYSTERECTOMY     ROTATOR CUFF REPAIR Left    ROTATOR CUFF REPAIR W/ DISTAL CLAVICLE EXCISION Right 11/2021   Family History  Problem Relation Age of Onset   CAD Mother    Heart disease Mother    Cancer Father    Heart disease Sister    Heart attack Sister    Heart disease Brother    Heart attack Sister    Arthritis Paternal Grandmother    Social History   Socioeconomic History   Marital status: Divorced    Spouse  name: Not on file   Number of children: 1   Years of education: some college   Highest education level: Not on file  Occupational History   Occupation: retired  Tobacco Use   Smoking status: Former    Packs/day: 0.50    Years: 45.00    Total pack years: 22.50    Types: Cigarettes    Quit date: 03/13/2012    Years since quitting: 10.5   Smokeless tobacco: Never  Vaping Use   Vaping Use: Never used  Substance and Sexual Activity   Alcohol use: No    Alcohol/week: 0.0 standard drinks of alcohol   Drug use: No   Sexual activity: Never  Other Topics Concern   Not on file  Social History Narrative   Pt lives alone    Social Determinants of Health   Financial Resource Strain: Low Risk  (08/15/2022)   Overall Financial Resource Strain (CARDIA)    Difficulty of Paying Living Expenses: Not hard at all  Food Insecurity: No Food Insecurity (09/10/2022)   Hunger Vital Sign    Worried About Running Out of Food in the Last Year: Never true    Ran Out of Food in the Last Year: Never true  Transportation Needs: No Transportation Needs (08/15/2022)   PRAPARE - Administrator, Civil Service (Medical): No    Lack of Transportation (Non-Medical): No  Physical Activity: Insufficiently Active (09/10/2022)   Exercise Vital Sign    Days of Exercise per Week: 2 days    Minutes of Exercise per Session: 30 min  Stress: No Stress Concern Present (09/10/2022)   [employer] of Occupational Health - Occupational Stress Questionnaire    Feeling of Stress : Not at all  Social Connections: Moderately Integrated (09/10/2022)   Social Connection and Isolation Panel [NHANES]    Frequency of Communication with Friends and Family: More than three times a week    Frequency of Social Gatherings with Friends and Family: Once a week    Attends Religious Services: More than 4 times per year    Active Member of [employer] or Organizations: Yes    Attends Engineer, structural: More than 4 times per year    Marital Status: Divorced    Tobacco Counseling Counseling given: Not Answered   Clinical Intake:  Pre-visit preparation completed: Yes  Pain : 0-10 Pain Score: 2  Pain Type: Acute pain Pain Location: Shoulder Pain Orientation: Right Pain Descriptors / Indicators: Aching, Constant     BMI - recorded: 31.09 Nutritional Status: BMI > 30  Obese Nutritional Risks: None Diabetes: No     Diabetic? No.  Interpreter Needed?: No  Information entered by :: Margaretha Sheffield, CMA   Activities of Daily Living    09/10/2022    8:29 AM 12/20/2021   10:59 AM  In your present state of health, do you  have any difficulty performing the following activities:  Hearing? 0 0  Vision? 0 0  Difficulty concentrating or making decisions? 0 0  Walking or climbing stairs? 1 0  Dressing or bathing? 0 0  Doing errands, shopping? 0 0  Preparing Food and eating ? N   Using the Toilet? N   In the past six months, have you accidently leaked urine? N   Do you have problems with loss of bowel control? N   Managing your Medications? N   Managing your Finances? N   Housekeeping or managing your Housekeeping? N     Patient Care  Team: Reubin Milan, MD as PCP - General (Internal Medicine) Loren Racer, MD as Referring Physician (Physical Medicine and Rehabilitation) Laretta Bolster, MD as Referring Physician (Cardiology)  Indicate any recent Medical Services you may have received from other than [facility] providers in the past year (date may be approximate).     Assessment:   This is a routine wellness examination for [place].  Hearing/Vision screen Hearing Screening - Comments:: No concerns. Vision Screening - Comments:: Wears prescription glasses when driving.  Dietary issues and exercise activities discussed: Current Exercise Habits: The patient has a physically strenuous job, but has no regular exercise apart from work., Exercise limited by: None identified   Goals Addressed   None   Depression Screen    09/10/2022    8:29 AM 08/15/2022   11:29 AM 02/19/2022   10:44 AM 12/20/2021   10:59 AM 12/04/2021   10:08 AM 09/11/2021    9:55 AM 09/09/2021    8:09 AM  PHQ 2/9 Scores  PHQ - 2 Score 0 0 0 0 0 0 0  PHQ- 9 Score 6 0 2 0 1 0     Fall Risk    09/10/2022    8:28 AM 08/15/2022   11:29 AM 12/20/2021   11:00 AM 12/04/2021   10:08 AM 09/11/2021    9:56 AM  Fall Risk   Falls in the past year? 0 0 0 0 0  Number falls in past yr: 0 0 0 0 0  Injury with Fall? 0 0 0 0 0  Risk for fall due to : No Fall Risks No Fall Risks No Fall Risks No Fall Risks No Fall Risks  Follow up Falls  evaluation completed Falls evaluation completed Falls evaluation completed Falls evaluation completed Falls evaluation completed    FALL RISK PREVENTION PERTAINING TO THE HOME:  Any stairs in or around the home? Yes  If so, are there any without handrails? Yes  Home free of loose throw rugs in walkways, pet beds, electrical cords, etc? Yes  Adequate lighting in your home to reduce risk of falls? Yes   ASSISTIVE DEVICES UTILIZED TO PREVENT FALLS:  Life alert? No  Use of a cane, walker or w/c? No  Grab bars in the bathroom? Yes  Shower chair or bench in shower? No  Elevated toilet seat or a handicapped toilet? Yes    Cognitive Function:        09/10/2022    8:30 AM 08/31/2019    8:26 AM 08/25/2018   11:45 AM 07/30/2017    9:57 AM 08/22/2016   10:37 AM  6CIT Screen  What Year? 0 points 0 points 0 points 0 points 0 points  What month? 0 points 0 points 0 points 0 points 0 points  What time? 0 points 0 points 0 points 0 points 0 points  Count back from 20 0 points 0 points 0 points 0 points 0 points  Months in reverse 0 points 0 points 0 points 0 points 0 points  Repeat phrase 0 points 2 points 0 points 0 points 0 points  Total Score 0 points 2 points 0 points 0 points 0 points    Immunizations Immunization History  Administered Date(s) Administered   Fluad Quad(high Dose 65+) 08/10/2019   Influenza Split 10/04/2009   Influenza, High Dose Seasonal PF 08/25/2018   Influenza,inj,Quad PF,6+ Mos 07/30/2017   Influenza-Unspecified 07/22/2015   Pneumococcal Conjugate-13 08/22/2016   Pneumococcal Polysaccharide-23 11/13/2009, 07/30/2017   Tdap 05/03/2014   Zoster  Recombinat (Shingrix) 11/03/2017, 01/01/2018   Zoster, Live 10/15/2011    TDAP status: Up to date  Flu Vaccine status: Declined, Education has been provided regarding the importance of this vaccine but patient still declined. Advised may receive this vaccine at local pharmacy or Health Dept. Aware to provide a  copy of the vaccination record if obtained from local pharmacy or Health Dept. Verbalized acceptance and understanding.  Pneumococcal vaccine status: Up to date  Covid-19 vaccine status: Declined, Education has been provided regarding the importance of this vaccine but patient still declined. Advised may receive this vaccine at local pharmacy or Health Dept.or vaccine clinic. Aware to provide a copy of the vaccination record if obtained from local pharmacy or Health Dept. Verbalized acceptance and understanding.  Qualifies for Shingles Vaccine? Yes   Zostavax completed Yes   Shingrix Completed?: Yes  Screening Tests Health Maintenance  Topic Date Due   COVID-19 Vaccine (1) Never done   Lung Cancer Screening  Never done   MAMMOGRAM  07/12/2021   INFLUENZA VACCINE  01/11/2023 (Originally 05/13/2022)   Medicare Annual Wellness (AWV)  09/11/2023   COLONOSCOPY (Pts 45-75yrs Insurance coverage will need to be confirmed)  01/02/2030   Pneumonia Vaccine 57+ Years old  Completed   DEXA SCAN  Completed   Hepatitis C Screening  Completed   Zoster Vaccines- Shingrix  Completed   HPV VACCINES  Aged Out    Health Maintenance  Health Maintenance Due  Topic Date Due   COVID-19 Vaccine (1) Never done   Lung Cancer Screening  Never done   MAMMOGRAM  07/12/2021    Colorectal cancer screening: Type of screening: Colonoscopy. Completed 01/03/2020. Repeat every 10 years  Mammogram: Patient declined due to shoulder pain and surgery - will discuss at future appointment.  Bone Density status: Completed 07/01/2016. Results reflect: Bone density results: OSTEOPENIA. Repeat every 3 years.  Lung Cancer Screening: (Low Dose CT Chest recommended if Age 69-80 years, 30 pack-year currently smoking OR have quit w/in 15years.) does not qualify.    Additional Screening:  Hepatitis C Screening: does qualify; Completed 02/19/2022  Vision Screening: Recommended annual ophthalmology exams for early detection  of glaucoma and other disorders of the eye. Is the patient up to date with their annual eye exam?  Yes  Who is the provider or what is the name of the office in which the patient attends annual eye exams? [facility]  If pt is not established with a provider, would they like to be referred to a provider to establish care? No .   Dental Screening: Recommended annual dental exams for proper oral hygiene  Community Resource Referral / Chronic Care Management: CRR required this visit?  No   CCM required this visit?  No      Plan:     I have personally reviewed and noted the following in the patient's chart:   Medical and social history Use of alcohol, tobacco or illicit drugs  Current medications and supplements including opioid prescriptions. Patient is not currently taking opioid prescriptions. Functional ability and status Nutritional status Physical activity Advanced directives List of other physicians Hospitalizations, surgeries, and ER visits in previous 12 months Vitals Screenings to include cognitive, depression, and falls Referrals and appointments  In addition, I have reviewed and discussed with patient certain preventive protocols, quality metrics, and best practice recommendations. A written personalized care plan for preventive services as well as general preventive health recommendations were provided to patient.     Sarea Fyfe N Oleda Borski,  CMA   09/10/2022     Ms. Begeman , Thank you for taking time to come for your Medicare Wellness Visit. I appreciate your ongoing commitment to your health goals. Please review the following plan we discussed and let me know if I can assist you in the future.   These are the goals we discussed:  Goals      DIET - INCREASE WATER INTAKE     Recommend drinking 6-8 glasses of water per day      Increase physical activity     Recommend increasing physical activity 3 days per week for 30 minutes.      Reduce sugar intake to X  grams per day     Recommend to eliminate sweets from your diet        This is a list of the screening recommended for you and due dates:  Health Maintenance  Topic Date Due   COVID-19 Vaccine (1) Never done   Screening for Lung Cancer  Never done   Mammogram  07/12/2021   Flu Shot  01/11/2023*   Medicare Annual Wellness Visit  09/11/2023   Colon Cancer Screening  01/02/2030   Pneumonia Vaccine  Completed   DEXA scan (bone density measurement)  Completed   Hepatitis C Screening: USPSTF Recommendation to screen - Ages 54-79 yo.  Completed   Zoster (Shingles) Vaccine  Completed   HPV Vaccine  Aged Out  *Topic was postponed. The date shown is not the original due date.     Nurse Notes:  None.

## 2022-09-11 ENCOUNTER — Telehealth: Payer: Self-pay

## 2022-09-11 NOTE — Telephone Encounter (Signed)
PA completed waiting on insurance approval.  Key: [id number]  [id number]

## 2022-09-12 NOTE — Telephone Encounter (Signed)
Denied  KP 

## 2022-09-16 ENCOUNTER — Telehealth: Payer: Self-pay | Admitting: Internal Medicine

## 2022-09-16 NOTE — Telephone Encounter (Signed)
Copied from CRM [id number] [id number]. Topic: General - Other >> Sep 16, 2022  9:13 AM [place] B wrote: Reason for CRM: [facility] called in checking on surgical clearance forms she faxed in a few times,but no response and the patients surgery is Thursday, for right shoulder. She states this is urgent or surgery can be cancelled.  Please call back fax number is [phone]

## 2022-09-16 NOTE — Telephone Encounter (Signed)
Faxed Surgery Clearance about 30 mins ago.

## 2022-09-18 DIAGNOSIS — G8918 Other acute postprocedural pain: Secondary | ICD-10-CM | POA: Diagnosis not present

## 2022-09-18 DIAGNOSIS — M7551 Bursitis of right shoulder: Secondary | ICD-10-CM | POA: Diagnosis not present

## 2022-09-18 DIAGNOSIS — M65811 Other synovitis and tenosynovitis, right shoulder: Secondary | ICD-10-CM | POA: Diagnosis not present

## 2022-09-18 DIAGNOSIS — M7501 Adhesive capsulitis of right shoulder: Secondary | ICD-10-CM | POA: Diagnosis not present

## 2022-09-18 DIAGNOSIS — M24111 Other articular cartilage disorders, right shoulder: Secondary | ICD-10-CM | POA: Diagnosis not present

## 2022-09-18 DIAGNOSIS — M7541 Impingement syndrome of right shoulder: Secondary | ICD-10-CM | POA: Diagnosis not present

## 2022-09-18 DIAGNOSIS — M24611 Ankylosis, right shoulder: Secondary | ICD-10-CM | POA: Diagnosis not present

## 2022-09-18 DIAGNOSIS — M19111 Post-traumatic osteoarthritis, right shoulder: Secondary | ICD-10-CM | POA: Diagnosis not present

## 2022-09-25 DIAGNOSIS — M25611 Stiffness of right shoulder, not elsewhere classified: Secondary | ICD-10-CM | POA: Diagnosis not present

## 2022-09-25 DIAGNOSIS — M25511 Pain in right shoulder: Secondary | ICD-10-CM | POA: Diagnosis not present

## 2022-10-02 DIAGNOSIS — G8918 Other acute postprocedural pain: Secondary | ICD-10-CM | POA: Diagnosis not present

## 2022-10-02 DIAGNOSIS — M25511 Pain in right shoulder: Secondary | ICD-10-CM | POA: Diagnosis not present

## 2022-10-02 DIAGNOSIS — M25512 Pain in left shoulder: Secondary | ICD-10-CM | POA: Diagnosis not present

## 2022-10-09 ENCOUNTER — Ambulatory Visit: Payer: Medicare PPO | Admitting: Family Medicine

## 2022-10-09 ENCOUNTER — Encounter: Payer: Self-pay | Admitting: Family Medicine

## 2022-10-09 VITALS — BP 128/78 | HR 88 | Temp 98.6°F | Ht 62.0 in | Wt 163.0 lb

## 2022-10-09 DIAGNOSIS — R059 Cough, unspecified: Secondary | ICD-10-CM

## 2022-10-09 DIAGNOSIS — R6889 Other general symptoms and signs: Secondary | ICD-10-CM

## 2022-10-09 DIAGNOSIS — J0110 Acute frontal sinusitis, unspecified: Secondary | ICD-10-CM | POA: Diagnosis not present

## 2022-10-09 LAB — POCT INFLUENZA A/B
Influenza A, POC: NEGATIVE
Influenza B, POC: NEGATIVE

## 2022-10-09 MED ORDER — TRIAMCINOLONE ACETONIDE 55 MCG/ACT NA AERO: 2.0000 | INHALATION_SPRAY | Freq: Every day | NASAL | 12 refills | Status: AC

## 2022-10-09 MED ORDER — DOXYCYCLINE HYCLATE 100 MG PO TABS: 100.0000 mg | ORAL_TABLET | Freq: Two times a day (BID) | ORAL | 0 refills | Status: DC

## 2022-10-09 NOTE — Progress Notes (Signed)
Date:  10/09/2022   Name:  Jordan Baxter   DOB:  August 04, 1951   MRN:  [id number]   Chief Complaint: Cough (Started 4 days ago with cough and sore throat, having head congestion. No production. COVID negative)  Cough This is a new problem. The problem has been waxing and waning. The problem occurs hourly. The cough is Non-productive. Associated symptoms include nasal congestion, postnasal drip and a sore throat. Pertinent negatives include no chest pain, ear pain, shortness of breath or wheezing.  Sinusitis This is a new problem. The current episode started in the past 7 days. The problem has been gradually worsening since onset. There has been no fever. The pain is mild. Associated symptoms include congestion, coughing, sinus pressure and a sore throat. Pertinent negatives include no ear pain, hoarse voice or shortness of breath.    Lab Results  Component Value Date   NA 143 02/19/2022   K 4.5 02/19/2022   CO2 28 02/19/2022   GLUCOSE 97 02/19/2022   BUN 10 02/19/2022   CREATININE 0.74 02/19/2022   CALCIUM 9.3 02/19/2022   EGFR 87 02/19/2022   GFRNONAA 80 11/29/2020   Lab Results  Component Value Date   CHOL 161 12/04/2021   HDL 62 12/04/2021   LDLCALC 73 12/04/2021   TRIG 156 (H) 12/04/2021   CHOLHDL 2.6 12/04/2021   Lab Results  Component Value Date   TSH 1.490 12/04/2021   Lab Results  Component Value Date   HGBA1C 6.5 (H) 12/04/2021   Lab Results  Component Value Date   WBC 8.8 02/19/2022   HGB 13.2 02/19/2022   HCT 41.4 02/19/2022   MCV 90 02/19/2022   PLT 409 02/19/2022   Lab Results  Component Value Date   ALT 27 02/19/2022   AST 24 02/19/2022   ALKPHOS 62 02/19/2022   BILITOT 0.3 02/19/2022   No results found for: "25OHVITD2", "25OHVITD3", "VD25OH"   Review of Systems  HENT:  Positive for congestion, postnasal drip, sinus pressure and sore throat. Negative for ear pain, hoarse voice and voice change.   Respiratory:  Positive for cough. Negative for  chest tightness, shortness of breath and wheezing.   Cardiovascular:  Negative for chest pain.    Patient Active Problem List   Diagnosis Date Noted   Rotator cuff tear arthropathy of right shoulder 12/04/2021   Trochanteric bursitis of left hip 09/11/2021   Cervical radiculopathy 09/11/2021   BMI 32.0-32.9,adult 05/21/2016   Tobacco use disorder, moderate, in sustained remission 04/23/2016   H/O cardiac catheterization 03/20/2015   Coronary artery disease involving native heart with angina pectoris ([place]) 03/19/2015   Essential (primary) hypertension 03/19/2015   Fibrositis 03/19/2015   Gastro-esophageal reflux disease without esophagitis 03/19/2015   Hyperlipidemia, mixed 03/19/2015   H/O arthrodesis 03/19/2015    Allergies  Allergen Reactions   Azithromycin Diarrhea   Cephalosporins    Ibuprofen Nausea Only   Nsaids Other (See Comments)   Penicillins Other (See Comments)   Sulfa Antibiotics Photosensitivity    Past Surgical History:  Procedure Laterality Date   ABDOMINAL HYSTERECTOMY  1986   BACK SURGERY  08/20/2020   L4 L5 S1 nerve [facility]   CARPAL TUNNEL RELEASE Right    CERVICAL FUSION  2015   CESAREAN SECTION  1984   COLONOSCOPY WITH ESOPHAGOGASTRODUODENOSCOPY (EGD)  08/31/2018   Dr. Epimenio Foot   KNEE ARTHROSCOPY Right    PARTIAL HYSTERECTOMY     ROTATOR CUFF REPAIR Left    ROTATOR CUFF  REPAIR W/ DISTAL CLAVICLE EXCISION Right 11/2021    Social History   Tobacco Use   Smoking status: Former    Packs/day: 0.50    Years: 45.00    Total pack years: 22.50    Types: Cigarettes    Quit date: 03/13/2012    Years since quitting: 10.5   Smokeless tobacco: Never  Vaping Use   Vaping Use: Never used  Substance Use Topics   Alcohol use: No    Alcohol/week: 0.0 standard drinks of alcohol   Drug use: No     Medication list has been reviewed and updated.  Current Meds  Medication Sig   aspirin EC 81 MG tablet Take 81 mg by mouth daily. Swallow  whole.   guaiFENesin-codeine (ROBITUSSIN AC) 100-10 MG/5ML syrup Take 5 mLs by mouth 3 (three) times daily as needed for cough.   metoprolol tartrate (LOPRESSOR) 25 MG tablet Take 25 mg by mouth 2 (two) times daily.    nitroGLYCERIN (NITROSTAT) 0.4 MG SL tablet Place 1 tablet under the tongue daily as needed.   omeprazole (PRILOSEC) 20 MG capsule TAKE 1 CAPSULE TWICE DAILY BEFORE MEALS   rosuvastatin (CRESTOR) 40 MG tablet Take 1 tablet by mouth daily.   triamterene-hydrochlorothiazide (MAXZIDE-25) 37.5-25 MG tablet TAKE 1 TABLET EVERY DAY       10/09/2022    4:20 PM 08/15/2022   11:29 AM 02/19/2022   10:44 AM 12/20/2021   10:59 AM  GAD 7 : Generalized Anxiety Score  Nervous, Anxious, on Edge 0 0 0 0  Control/stop worrying 0 0 0 0  Worry too much - different things 0 0 0 0  Trouble relaxing 0 0 0 0  Restless 0 0 0 0  Easily annoyed or irritable 0 0 0 0  Afraid - awful might happen 0 0 0 0  Total GAD 7 Score 0 0 0 0  Anxiety Difficulty Not difficult at all Not difficult at all Not difficult at all        10/09/2022    4:20 PM 09/10/2022    8:29 AM 08/15/2022   11:29 AM  Depression screen PHQ 2/9  Decreased Interest 0 0 0  Down, Depressed, Hopeless 0 0 0  PHQ - 2 Score 0 0 0  Altered sleeping 0 3 0  Tired, decreased energy 0 3 0  Change in appetite 0 0 0  Feeling bad or failure about yourself  0 0 0  Trouble concentrating 0 0 0  Moving slowly or fidgety/restless 0 0 0  Suicidal thoughts 0 0 0  PHQ-9 Score 0 6 0  Difficult doing work/chores Not difficult at all Somewhat difficult Not difficult at all    BP Readings from Last 3 Encounters:  10/09/22 128/78  09/08/22 116/70  08/15/22 126/76    Physical Exam Vitals and nursing note reviewed.  HENT:     Right Ear: Tympanic membrane and ear canal normal.     Left Ear: Ear canal normal.     Nose: Nose normal.  Cardiovascular:     Rate and Rhythm: Normal rate.     Heart sounds: No murmur heard.    No friction rub. No  gallop.  Pulmonary:     Breath sounds: No wheezing, rhonchi or rales.  Chest:     Chest wall: No tenderness.  Lymphadenopathy:     Cervical: No cervical adenopathy.     Wt Readings from Last 3 Encounters:  10/09/22 163 lb (73.9 kg)  09/10/22 170 lb (77.1  kg)  09/08/22 170 lb (77.1 kg)    BP 128/78   Pulse 88   Temp 98.6 F (37 C) (Oral)   Ht _0  (1.575 m)   Wt 163 lb (73.9 kg)   SpO2 96%   BMI 29.81 kg/m   Assessment and Plan:  1. Flu-like symptoms Patient with flulike symptoms but checked around COVID which was negative.  Patient has symptomatology of possible influenza A or B so influenza AB was performed  this was noted to be negative.  And this is behaving more like the previous sinus infection that she had.  And so we will treat as if this is either a residual from the previous infection or a new infection.  We will hold on doing an x-ray of the sinuses at this time but if this should repeat in the next 4 to 6 weeks may make considerations in this direction in the meantime we will initiate Nasacort nasal spray aqueous  pump for mucous membrane passages dilation along with nasal saline for irrigation. - POCT Influenza A/B - triamcinolone (NASACORT) 55 MCG/ACT AERO nasal inhaler; Place 2 sprays into the nose daily.  Dispense: 1 each; Refill: 12  2. Acute non-recurrent frontal sinusitis New onset.  Persistent.  May be residual from the previous or a new exacerbation of previous nasal sinusitis.  Patient's Frontal sinus discomfort.  We will initiate doxycycline 100 mg twice a day for which we did discuss holding on the Levaquin given the black box warning for tendon rupture and selection for C. difficile. - triamcinolone (NASACORT) 55 MCG/ACT AERO nasal inhaler; Place 2 sprays into the nose daily.  Dispense: 1 each; Refill: 12 - doxycycline (VIBRA-TABS) 100 MG tablet; Take 1 tablet (100 mg total) by mouth 2 (two) times daily.  Dispense: 20 tablet; Refill: 0  3. Cough,  unspecified type Nonproductive cough probably needs a mucolytic agent I have asked for her to save her Robitussin AC for nighttime administration for suppression and to pick up Mucinex DM to be used during the day.   Otilio Miu, MD

## 2022-10-20 DIAGNOSIS — S76911A Strain of unspecified muscles, fascia and tendons at thigh level, right thigh, initial encounter: Secondary | ICD-10-CM | POA: Diagnosis not present

## 2022-10-21 DIAGNOSIS — M25511 Pain in right shoulder: Secondary | ICD-10-CM | POA: Diagnosis not present

## 2022-10-21 DIAGNOSIS — M25611 Stiffness of right shoulder, not elsewhere classified: Secondary | ICD-10-CM | POA: Diagnosis not present

## 2022-10-23 ENCOUNTER — Other Ambulatory Visit: Payer: Self-pay

## 2022-10-23 DIAGNOSIS — Z13820 Encounter for screening for osteoporosis: Secondary | ICD-10-CM

## 2022-10-23 MED ORDER — TRIAMTERENE-HCTZ 37.5-25 MG PO TABS: 1.0000 | ORAL_TABLET | Freq: Every day | ORAL | 1 refills | Status: DC

## 2022-10-29 ENCOUNTER — Encounter: Payer: Self-pay | Admitting: Family Medicine

## 2022-10-29 ENCOUNTER — Ambulatory Visit: Payer: Medicare PPO | Admitting: Family Medicine

## 2022-10-29 ENCOUNTER — Ambulatory Visit: Payer: Self-pay

## 2022-10-29 VITALS — BP 122/60 | HR 72 | Ht 62.0 in | Wt 163.0 lb

## 2022-10-29 DIAGNOSIS — R052 Subacute cough: Secondary | ICD-10-CM | POA: Diagnosis not present

## 2022-10-29 DIAGNOSIS — J4521 Mild intermittent asthma with (acute) exacerbation: Secondary | ICD-10-CM

## 2022-10-29 DIAGNOSIS — Z87891 Personal history of nicotine dependence: Secondary | ICD-10-CM | POA: Diagnosis not present

## 2022-10-29 DIAGNOSIS — F17201 Nicotine dependence, unspecified, in remission: Secondary | ICD-10-CM

## 2022-10-29 MED ORDER — FLUTICASONE-SALMETEROL 100-50 MCG/ACT IN AEPB: 1.0000 | INHALATION_SPRAY | Freq: Two times a day (BID) | RESPIRATORY_TRACT | 3 refills | Status: DC

## 2022-10-29 MED ORDER — ALBUTEROL SULFATE HFA 108 (90 BASE) MCG/ACT IN AERS: 2.0000 | INHALATION_SPRAY | Freq: Four times a day (QID) | RESPIRATORY_TRACT | 2 refills | Status: DC | PRN

## 2022-10-29 NOTE — Progress Notes (Signed)
Date:  10/29/2022   Name:  Jordan Baxter   DOB:  03/01/1951   MRN:  [id number]   Chief Complaint: Cough (Has had 3 rounds of antibiotics- chest xray in November was normal)  Cough This is a chronic problem. The current episode started more than 1 year ago. The problem has been waxing and waning. The problem occurs every few minutes. The cough is Productive of sputum (clear to flecks of yellow). Pertinent negatives include no chills, fever, hemoptysis, nasal congestion, postnasal drip, rhinorrhea, shortness of breath or wheezing. The symptoms are aggravated by lying down. She has tried a beta-agonist inhaler for the symptoms. The treatment provided mild relief. Her past medical history is significant for bronchitis.    Lab Results  Component Value Date   NA 143 02/19/2022   K 4.5 02/19/2022   CO2 28 02/19/2022   GLUCOSE 97 02/19/2022   BUN 10 02/19/2022   CREATININE 0.74 02/19/2022   CALCIUM 9.3 02/19/2022   EGFR 87 02/19/2022   GFRNONAA 80 11/29/2020   Lab Results  Component Value Date   CHOL 161 12/04/2021   HDL 62 12/04/2021   LDLCALC 73 12/04/2021   TRIG 156 (H) 12/04/2021   CHOLHDL 2.6 12/04/2021   Lab Results  Component Value Date   TSH 1.490 12/04/2021   Lab Results  Component Value Date   HGBA1C 6.5 (H) 12/04/2021   Lab Results  Component Value Date   WBC 8.8 02/19/2022   HGB 13.2 02/19/2022   HCT 41.4 02/19/2022   MCV 90 02/19/2022   PLT 409 02/19/2022   Lab Results  Component Value Date   ALT 27 02/19/2022   AST 24 02/19/2022   ALKPHOS 62 02/19/2022   BILITOT 0.3 02/19/2022   No results found for: "25OHVITD2", "25OHVITD3", "VD25OH"   Review of Systems  Constitutional:  Negative for chills and fever.  HENT:  Negative for postnasal drip and rhinorrhea.   Respiratory:  Positive for cough. Negative for hemoptysis, shortness of breath and wheezing.     Patient Active Problem List   Diagnosis Date Noted   Rotator cuff tear arthropathy of right  shoulder 12/04/2021   Trochanteric bursitis of left hip 09/11/2021   Cervical radiculopathy 09/11/2021   BMI 32.0-32.9,adult 05/21/2016   Tobacco use disorder, moderate, in sustained remission 04/23/2016   H/O cardiac catheterization 03/20/2015   Coronary artery disease involving native heart with angina pectoris (HCC) 03/19/2015   Essential (primary) hypertension 03/19/2015   Fibrositis 03/19/2015   Gastro-esophageal reflux disease without esophagitis 03/19/2015   Hyperlipidemia, mixed 03/19/2015   H/O arthrodesis 03/19/2015    Allergies  Allergen Reactions   Azithromycin Diarrhea   Cephalosporins    Ibuprofen Nausea Only   Nsaids Other (See Comments)   Penicillins Other (See Comments)   Sulfa Antibiotics Photosensitivity    Past Surgical History:  Procedure Laterality Date   ABDOMINAL HYSTERECTOMY  1986   BACK SURGERY  08/20/2020   L4 L5 S1 nerve ablation [facility]   CARPAL TUNNEL RELEASE Right    CERVICAL FUSION  2015   CESAREAN SECTION  1984   COLONOSCOPY WITH ESOPHAGOGASTRODUODENOSCOPY (EGD)  08/31/2018   Dr. Earlean Polka   KNEE ARTHROSCOPY Right    PARTIAL HYSTERECTOMY     ROTATOR CUFF REPAIR Left    ROTATOR CUFF REPAIR W/ DISTAL CLAVICLE EXCISION Right 11/2021    Social History   Tobacco Use   Smoking status: Former    Packs/day: 0.50    Years: 45.00  Total pack years: 22.50    Types: Cigarettes    Quit date: 03/13/2012    Years since quitting: 10.6   Smokeless tobacco: Never  Vaping Use   Vaping Use: Never used  Substance Use Topics   Alcohol use: No    Alcohol/week: 0.0 standard drinks of alcohol   Drug use: No     Medication list has been reviewed and updated.  Current Meds  Medication Sig   aspirin EC 81 MG tablet Take 81 mg by mouth daily. Swallow whole.   metoprolol tartrate (LOPRESSOR) 25 MG tablet Take 25 mg by mouth 2 (two) times daily.    nitroGLYCERIN (NITROSTAT) 0.4 MG SL tablet Place 1 tablet under the tongue daily as needed.    omeprazole (PRILOSEC) 20 MG capsule TAKE 1 CAPSULE TWICE DAILY BEFORE MEALS   rosuvastatin (CRESTOR) 40 MG tablet Take 1 tablet by mouth daily.   triamcinolone (NASACORT) 55 MCG/ACT AERO nasal inhaler Place 2 sprays into the nose daily.   triamterene-hydrochlorothiazide (MAXZIDE-25) 37.5-25 MG tablet Take 1 tablet by mouth daily.       10/29/2022   11:21 AM 10/09/2022    4:20 PM 08/15/2022   11:29 AM 02/19/2022   10:44 AM  GAD 7 : Generalized Anxiety Score  Nervous, Anxious, on Edge 0 0 0 0  Control/stop worrying 0 0 0 0  Worry too much - different things 0 0 0 0  Trouble relaxing 0 0 0 0  Restless 0 0 0 0  Easily annoyed or irritable 0 0 0 0  Afraid - awful might happen 0 0 0 0  Total GAD 7 Score 0 0 0 0  Anxiety Difficulty Not difficult at all Not difficult at all Not difficult at all Not difficult at all       10/29/2022   11:21 AM 10/09/2022    4:20 PM 09/10/2022    8:29 AM  Depression screen PHQ 2/9  Decreased Interest 0 0 0  Down, Depressed, Hopeless 0 0 0  PHQ - 2 Score 0 0 0  Altered sleeping 0 0 3  Tired, decreased energy 0 0 3  Change in appetite 0 0 0  Feeling bad or failure about yourself  0 0 0  Trouble concentrating 0 0 0  Moving slowly or fidgety/restless 0 0 0  Suicidal thoughts 0 0 0  PHQ-9 Score 0 0 6  Difficult doing work/chores Not difficult at all Not difficult at all Somewhat difficult    BP Readings from Last 3 Encounters:  10/29/22 122/60  10/09/22 128/78  09/08/22 116/70    Physical Exam Vitals and nursing note reviewed. Exam conducted with a chaperone present.  Constitutional:      General: She is not in acute distress.    Appearance: She is not diaphoretic.  HENT:     Head: Normocephalic and atraumatic.     Right Ear: Tympanic membrane and external ear normal.     Left Ear: Tympanic membrane and external ear normal.     Nose: Nose normal.     Mouth/Throat:     Mouth: Mucous membranes are moist.  Eyes:     General:        Right  eye: No discharge.        Left eye: No discharge.     Conjunctiva/sclera: Conjunctivae normal.     Pupils: Pupils are equal, round, and reactive to light.  Neck:     Thyroid: No thyromegaly.     Vascular: No  JVD.  Cardiovascular:     Rate and Rhythm: Normal rate and regular rhythm.     Heart sounds: Normal heart sounds. No murmur heard.    No friction rub. No gallop.  Pulmonary:     Effort: Pulmonary effort is normal.     Breath sounds: Normal breath sounds. Decreased air movement present. No decreased breath sounds, wheezing, rhonchi or rales.     Comments: Decreased air movement Abdominal:     General: Bowel sounds are normal.     Palpations: Abdomen is soft. There is no mass.     Tenderness: There is no abdominal tenderness. There is no guarding.  Musculoskeletal:        General: Normal range of motion.     Cervical back: Normal range of motion and neck supple.  Lymphadenopathy:     Cervical: No cervical adenopathy.  Skin:    General: Skin is warm and dry.  Neurological:     Mental Status: She is alert.     Deep Tendon Reflexes: Reflexes are normal and symmetric.     Wt Readings from Last 3 Encounters:  10/29/22 163 lb (73.9 kg)  10/09/22 163 lb (73.9 kg)  09/10/22 170 lb (77.1 kg)    BP 122/60   Pulse 72   Ht 5\' 2"  (1.575 m)   Wt 163 lb (73.9 kg)   SpO2 93%   BMI 29.81 kg/m   Assessment and Plan:  1. Subacute cough Chronic.  Episodic.  Seems to be occurring every year and about this time a year see 2018 2019.  Discussed multiple things that could be causing a chronic recurrent cough and that this is not necessarily a need for antibiotic nor repeat x-ray.  Review of x-ray from December 2023 was normal.  Beginning to think there is some underlying COPD concerns but also given her history of smoking that we need to proceed with pulmonary consult if able to obtain ambulatory referral for lung cancer screening.  In the meantime I would encourage other than inhalers  mucolytic lytic agents such as Mucinex DM for cough suppression. - Ambulatory referral to Pulmonology - Ambulatory Referral for Lung Cancer Screening - fluticasone-salmeterol (ADVAIR) 100-50 MCG/ACT AEPB; Inhale 1 puff into the lungs 2 (two) times daily.  Dispense: 1 each; Refill: 3  2. Mild intermittent reactive airway disease with acute exacerbation In 2018 patient was placed on Advair and as she recalls that there was some improvement.  We will resume that as as well as initiate albuterol inhaler 1 to 2 puffs as needed for shortness of breath and cough which may reflect shortness of breath. - Ambulatory referral to Pulmonology - fluticasone-salmeterol (ADVAIR) 100-50 MCG/ACT AEPB; Inhale 1 puff into the lungs 2 (two) times daily.  Dispense: 1 each; Refill: 3 - albuterol (VENTOLIN HFA) 108 (90 Base) MCG/ACT inhaler; Inhale 2 puffs into the lungs every 6 (six) hours as needed for wheezing or shortness of breath.  Dispense: 8 g; Refill: 2  3. History of smoking 25-50 pack years Patient has a history of smoking in which she started in 1968 and discontinued in 2013 which is about three quarters of a pack a day on average which would put her in area in which she should be able to obtain ambulatory referral for lung cancer screening via low-dose CT scan. - Ambulatory Referral for Lung Cancer Screening.  4. Tobacco use disorder, moderate, in sustained remission As noted above - Ambulatory Referral for Lung Cancer Scre    Taiquan Campanaro  Ronnald Ramp, MD

## 2022-10-29 NOTE — Telephone Encounter (Signed)
Patient states that she has been sick since October. Per patient she has had 3 rounds of antibiotics, Mucinex DM, when she get done with the round of antibiotics the coughing and sinuitis comes back and bronchitis. She is on her second box of Mucinex DM and want to know if she need to get a chest  x ray or if she need to be tested for whooping cough. Patient is worried about taking so much medication and not getting any better. Please advise.     Chief Complaint: Cough x several weeks,has been on antibiotics. Concerned about whooping cough. Symptoms: Above Frequency: Several weeks Pertinent Negatives: Patient denies fever Disposition: [] ED /[] Urgent Care (no appt availability in office) / [x] Appointment(In office/virtual)/ []  [facility]/ [] Home Care/ [] Refused Recommended Disposition /[] [facility]/ []  Follow-up with PCP Additional Notes:   Reason for Disposition  SEVERE coughing spells (e.g., whooping sound after coughing, vomiting after coughing)  Answer Assessment - Initial Assessment Questions 1. ONSET: "When did the cough begin?"      Several weeks 2. SEVERITY: "How bad is the cough today?"      Severe 3. SPUTUM: "Describe the color of your sputum" (none, dry cough; clear, white, yellow, green)     Thick mucus 4. HEMOPTYSIS: "Are you coughing up any blood?" If so ask: "How much?" (flecks, streaks, tablespoons, etc.)     No 5. DIFFICULTY BREATHING: "Are you having difficulty breathing?" If Yes, ask: "How bad is it?" (e.g., mild, moderate, severe)    - MILD: No SOB at rest, mild SOB with walking, speaks normally in sentences, can lie down, no retractions, pulse < 100.    - MODERATE: SOB at rest, SOB with minimal exertion and prefers to sit, cannot lie down flat, speaks in phrases, mild retractions, audible wheezing, pulse 100-120.    - SEVERE: Very SOB at rest, speaks in single words, struggling to breathe, sitting hunched forward, retractions, pulse > 120       No 6. FEVER: "Do you have a fever?" If Yes, ask: "What is your temperature, how was it measured, and when did it start?"     No 7. CARDIAC HISTORY: "Do you have any history of heart disease?" (e.g., heart attack, congestive heart failure)      No 8. LUNG HISTORY: "Do you have any history of lung disease?"  (e.g., pulmonary embolus, asthma, emphysema)     No 9. PE RISK FACTORS: "Do you have a history of blood clots?" (or: recent major surgery, recent prolonged travel, bedridden)     No 10. OTHER SYMPTOMS: "Do you have any other symptoms?" (e.g., runny nose, wheezing, chest pain)       Runny nose 11. PREGNANCY: "Is there any chance you are pregnant?" "When was your last menstrual period?"       No 12. TRAVEL: "Have you traveled out of the country in the last month?" (e.g., travel history, exposures)       No  Protocols used: Cough - Acute Productive-A-AH

## 2022-11-04 DIAGNOSIS — M25611 Stiffness of right shoulder, not elsewhere classified: Secondary | ICD-10-CM | POA: Diagnosis not present

## 2022-11-04 DIAGNOSIS — M25511 Pain in right shoulder: Secondary | ICD-10-CM | POA: Diagnosis not present

## 2022-11-10 DIAGNOSIS — Z96611 Presence of right artificial shoulder joint: Secondary | ICD-10-CM | POA: Diagnosis not present

## 2022-11-13 ENCOUNTER — Other Ambulatory Visit: Payer: Self-pay | Admitting: *Deleted

## 2022-11-13 DIAGNOSIS — Z87891 Personal history of nicotine dependence: Secondary | ICD-10-CM

## 2022-11-13 DIAGNOSIS — Z122 Encounter for screening for malignant neoplasm of respiratory organs: Secondary | ICD-10-CM

## 2022-11-14 DIAGNOSIS — M7062 Trochanteric bursitis, left hip: Secondary | ICD-10-CM | POA: Diagnosis not present

## 2022-11-18 DIAGNOSIS — M25511 Pain in right shoulder: Secondary | ICD-10-CM | POA: Diagnosis not present

## 2022-11-18 DIAGNOSIS — M25611 Stiffness of right shoulder, not elsewhere classified: Secondary | ICD-10-CM | POA: Diagnosis not present

## 2022-11-20 DIAGNOSIS — S76911A Strain of unspecified muscles, fascia and tendons at thigh level, right thigh, initial encounter: Secondary | ICD-10-CM | POA: Diagnosis not present

## 2022-12-02 DIAGNOSIS — M7062 Trochanteric bursitis, left hip: Secondary | ICD-10-CM | POA: Diagnosis not present

## 2022-12-02 DIAGNOSIS — M25611 Stiffness of right shoulder, not elsewhere classified: Secondary | ICD-10-CM | POA: Diagnosis not present

## 2022-12-02 DIAGNOSIS — M25511 Pain in right shoulder: Secondary | ICD-10-CM | POA: Diagnosis not present

## 2022-12-02 DIAGNOSIS — Z01818 Encounter for other preprocedural examination: Secondary | ICD-10-CM | POA: Diagnosis not present

## 2022-12-03 ENCOUNTER — Encounter: Payer: Self-pay | Admitting: Pulmonary Disease

## 2022-12-03 ENCOUNTER — Ambulatory Visit (INDEPENDENT_AMBULATORY_CARE_PROVIDER_SITE_OTHER): Payer: Medicare PPO | Admitting: Pulmonary Disease

## 2022-12-03 DIAGNOSIS — F17211 Nicotine dependence, cigarettes, in remission: Secondary | ICD-10-CM

## 2022-12-03 DIAGNOSIS — Z87891 Personal history of nicotine dependence: Secondary | ICD-10-CM

## 2022-12-03 DIAGNOSIS — Z122 Encounter for screening for malignant neoplasm of respiratory organs: Secondary | ICD-10-CM

## 2022-12-03 NOTE — Patient Instructions (Signed)
Thank you for participating in the [facility]. It was our pleasure to meet you today. We will call you with the results of your scan within the next few days. Your scan will be assigned a Lung RADS category score by the physicians reading the scans.  This Lung RADS score determines follow up scanning.  See below for description of categories, and follow up screening recommendations. We will be in touch to schedule your follow up screening annually or based on recommendations of our providers. We will fax a copy of your scan results to your Primary Care Physician, or the physician who referred you to the program, to ensure they have the results. Please call the office if you have any questions or concerns regarding your scanning experience or results.  Our office number is [phone]. Please speak with Denise Phelps, RN. , or  Denise Buckner RN, They are  our Lung Cancer Screening RN.'s If They are unavailable when you call, Please leave a message on the voice mail. We will return your call at our earliest convenience.This voice mail is monitored several times a day.  Remember, if your scan is normal, we will scan you annually as long as you continue to meet the criteria for the program. (Age 50-80, Current smoker or smoker who has quit within the last 15 years). If you are a smoker, remember, quitting is the single most powerful action that you can take to decrease your risk of lung cancer and other pulmonary, breathing related problems. We know quitting is hard, and we are here to help.  Please let us know if there is anything we can do to help you meet your goal of quitting. If you are a former smoker, congratulations. We are proud of you! Remain smoke free! Remember you can refer friends or family members through the number above.  We will screen them to make sure they meet criteria for the program. Thank you for helping us take better care of you by  participating in Lung Screening.  You can receive free nicotine replacement therapy ( patches, gum or mints) by calling 1-800-QUIT NOW. Please call so we can get you on the path to becoming  a non-smoker. I know it is hard, but you can do this!  Lung RADS Categories:  Lung RADS 1: no nodules or definitely non-concerning nodules.  Recommendation is for a repeat annual scan in 12 months.  Lung RADS 2:  nodules that are non-concerning in appearance and behavior with a very low likelihood of becoming an active cancer. Recommendation is for a repeat annual scan in 12 months.  Lung RADS 3: nodules that are probably non-concerning , includes nodules with a low likelihood of becoming an active cancer.  Recommendation is for a 6-month repeat screening scan. Often noted after an upper respiratory illness. We will be in touch to make sure you have no questions, and to schedule your 6-month scan.  Lung RADS 4 A: nodules with concerning findings, recommendation is most often for a follow up scan in 3 months or additional testing based on our provider's assessment of the scan. We will be in touch to make sure you have no questions and to schedule the recommended 3 month follow up scan.  Lung RADS 4 B:  indicates findings that are concerning. We will be in touch with you to schedule additional diagnostic testing based on our provider's  assessment of the scan.  Other options for assistance in smoking cessation (   As covered by your insurance benefits)  Hypnosis for smoking cessation  [employer] [phone]  Acupuncture for smoking cessation  [facility] [phone]   

## 2022-12-03 NOTE — Progress Notes (Signed)
Shared Decision Making Visit Lung Cancer Screening Program [phone])   Eligibility: Age 72 y.o. Pack Years Smoking History Calculation 30 (# packs/per year x # years smoked) Recent History of coughing up blood  no Unexplained weight loss? No - has lost about 10lbs over the last 6 months due to recent shoulder pain and making dietary changes decreasing sweets  ( >Than 15 pounds within the last 6 months ) Prior History Lung / other cancer no (Diagnosis within the last 5 years already requiring surveillance chest CT Scans). Smoking Status Former Smoker Former Smokers: Years since quit: 11 years  Quit Date: 2013  Visit Components: Discussion included one or more decision making aids. yes Discussion included risk/benefits of screening. yes Discussion included potential follow up diagnostic testing for abnormal scans. yes Discussion included meaning and risk of over diagnosis. yes Discussion included meaning and risk of False Positives. yes Discussion included meaning of total radiation exposure. yes  Counseling Included: Importance of adherence to annual lung cancer LDCT screening. yes Impact of comorbidities on ability to participate in the program. yes Ability and willingness to under diagnostic treatment. yes  Smoking Cessation Counseling: Former Smokers:  Discussed the importance of maintaining cigarette abstinence. yes Diagnosis Code: Personal History of Nicotine Dependence. [id number] Information about tobacco cessation classes and interventions provided to patient. Yes Patient provided with "ticket" for LDCT Scan. yes Written Order for Lung Cancer Screening with LDCT placed in Epic. Yes (CT Chest Lung Cancer Screening Low Dose W/O CM) [id number] Z12.2-Screening of respiratory organs Z87.891-Personal history of nicotine dependence   Lauraine Rinne, NP

## 2022-12-04 ENCOUNTER — Ambulatory Visit
Admission: RE | Admit: 2022-12-04 | Discharge: 2022-12-04 | Disposition: A | Discharge status: Home / Self Care | Payer: Medicare PPO | Source: Ambulatory Visit | Attending: Acute Care | Admitting: Acute Care

## 2022-12-04 DIAGNOSIS — Z122 Encounter for screening for malignant neoplasm of respiratory organs: Secondary | ICD-10-CM | POA: Insufficient documentation

## 2022-12-04 DIAGNOSIS — Z87891 Personal history of nicotine dependence: Secondary | ICD-10-CM | POA: Diagnosis not present

## 2022-12-05 ENCOUNTER — Other Ambulatory Visit: Payer: Self-pay

## 2022-12-05 DIAGNOSIS — Z122 Encounter for screening for malignant neoplasm of respiratory organs: Secondary | ICD-10-CM

## 2022-12-05 DIAGNOSIS — Z87891 Personal history of nicotine dependence: Secondary | ICD-10-CM

## 2022-12-08 ENCOUNTER — Encounter: Payer: Medicare PPO | Admitting: Internal Medicine

## 2022-12-11 DIAGNOSIS — E785 Hyperlipidemia, unspecified: Secondary | ICD-10-CM | POA: Diagnosis not present

## 2022-12-11 DIAGNOSIS — Z7982 Long term (current) use of aspirin: Secondary | ICD-10-CM | POA: Diagnosis not present

## 2022-12-11 DIAGNOSIS — M7062 Trochanteric bursitis, left hip: Secondary | ICD-10-CM | POA: Diagnosis not present

## 2022-12-11 DIAGNOSIS — Z955 Presence of coronary angioplasty implant and graft: Secondary | ICD-10-CM | POA: Diagnosis not present

## 2022-12-11 DIAGNOSIS — I2510 Atherosclerotic heart disease of native coronary artery without angina pectoris: Secondary | ICD-10-CM | POA: Diagnosis not present

## 2022-12-11 DIAGNOSIS — I252 Old myocardial infarction: Secondary | ICD-10-CM | POA: Diagnosis not present

## 2022-12-11 DIAGNOSIS — M7632 Iliotibial band syndrome, left leg: Secondary | ICD-10-CM | POA: Diagnosis not present

## 2022-12-19 DIAGNOSIS — S76911A Strain of unspecified muscles, fascia and tendons at thigh level, right thigh, initial encounter: Secondary | ICD-10-CM | POA: Diagnosis not present

## 2022-12-23 ENCOUNTER — Other Ambulatory Visit: Payer: Self-pay | Admitting: Internal Medicine

## 2022-12-23 DIAGNOSIS — M25511 Pain in right shoulder: Secondary | ICD-10-CM | POA: Diagnosis not present

## 2022-12-23 DIAGNOSIS — M25611 Stiffness of right shoulder, not elsewhere classified: Secondary | ICD-10-CM | POA: Diagnosis not present

## 2022-12-23 DIAGNOSIS — K219 Gastro-esophageal reflux disease without esophagitis: Secondary | ICD-10-CM

## 2022-12-27 ENCOUNTER — Other Ambulatory Visit: Payer: Self-pay | Admitting: Family Medicine

## 2022-12-27 DIAGNOSIS — J4521 Mild intermittent asthma with (acute) exacerbation: Secondary | ICD-10-CM

## 2022-12-30 ENCOUNTER — Telehealth: Payer: Self-pay | Admitting: Internal Medicine

## 2022-12-30 NOTE — Telephone Encounter (Signed)
Patient informed.  Jordan Baxter 

## 2022-12-30 NOTE — Telephone Encounter (Signed)
Copied from [place] [id number]. Topic: General - Inquiry >> Dec 30, 2022  1:31 PM Jordan Baxter wrote: Reason for CRM: pt had a CT on Feb 22 and has not heard back from the provider yet.  CB#  [phone]

## 2023-01-01 DIAGNOSIS — M25511 Pain in right shoulder: Secondary | ICD-10-CM | POA: Diagnosis not present

## 2023-01-01 DIAGNOSIS — M25611 Stiffness of right shoulder, not elsewhere classified: Secondary | ICD-10-CM | POA: Diagnosis not present

## 2023-01-05 DIAGNOSIS — M75120 Complete rotator cuff tear or rupture of unspecified shoulder, not specified as traumatic: Secondary | ICD-10-CM | POA: Diagnosis not present

## 2023-01-05 DIAGNOSIS — M19019 Primary osteoarthritis, unspecified shoulder: Secondary | ICD-10-CM | POA: Diagnosis not present

## 2023-01-05 DIAGNOSIS — M19011 Primary osteoarthritis, right shoulder: Secondary | ICD-10-CM | POA: Diagnosis not present

## 2023-01-19 DIAGNOSIS — S76911A Strain of unspecified muscles, fascia and tendons at thigh level, right thigh, initial encounter: Secondary | ICD-10-CM | POA: Diagnosis not present

## 2023-01-20 DIAGNOSIS — M25511 Pain in right shoulder: Secondary | ICD-10-CM | POA: Diagnosis not present

## 2023-02-02 DIAGNOSIS — R911 Solitary pulmonary nodule: Secondary | ICD-10-CM | POA: Diagnosis not present

## 2023-02-02 DIAGNOSIS — J4520 Mild intermittent asthma, uncomplicated: Secondary | ICD-10-CM | POA: Diagnosis not present

## 2023-02-02 DIAGNOSIS — R052 Subacute cough: Secondary | ICD-10-CM | POA: Diagnosis not present

## 2023-02-02 DIAGNOSIS — F17210 Nicotine dependence, cigarettes, uncomplicated: Secondary | ICD-10-CM | POA: Diagnosis not present

## 2023-02-02 LAB — PULMONARY FUNCTION TEST

## 2023-02-18 DIAGNOSIS — S76911A Strain of unspecified muscles, fascia and tendons at thigh level, right thigh, initial encounter: Secondary | ICD-10-CM | POA: Diagnosis not present

## 2023-03-13 ENCOUNTER — Encounter: Payer: Medicare PPO | Admitting: Internal Medicine

## 2023-04-29 ENCOUNTER — Ambulatory Visit (INDEPENDENT_AMBULATORY_CARE_PROVIDER_SITE_OTHER): Payer: Medicare PPO | Admitting: Internal Medicine

## 2023-04-29 ENCOUNTER — Encounter: Payer: Self-pay | Admitting: Internal Medicine

## 2023-04-29 VITALS — BP 122/78 | HR 71 | Ht 62.0 in | Wt 167.0 lb

## 2023-04-29 DIAGNOSIS — Z0000 Encounter for general adult medical examination without abnormal findings: Secondary | ICD-10-CM

## 2023-04-29 DIAGNOSIS — I25119 Atherosclerotic heart disease of native coronary artery with unspecified angina pectoris: Secondary | ICD-10-CM

## 2023-04-29 DIAGNOSIS — Z1231 Encounter for screening mammogram for malignant neoplasm of breast: Secondary | ICD-10-CM

## 2023-04-29 DIAGNOSIS — E782 Mixed hyperlipidemia: Secondary | ICD-10-CM

## 2023-04-29 DIAGNOSIS — F17201 Nicotine dependence, unspecified, in remission: Secondary | ICD-10-CM

## 2023-04-29 DIAGNOSIS — J4520 Mild intermittent asthma, uncomplicated: Secondary | ICD-10-CM

## 2023-04-29 DIAGNOSIS — K219 Gastro-esophageal reflux disease without esophagitis: Secondary | ICD-10-CM

## 2023-04-29 DIAGNOSIS — R7303 Prediabetes: Secondary | ICD-10-CM

## 2023-04-29 DIAGNOSIS — I10 Essential (primary) hypertension: Secondary | ICD-10-CM

## 2023-04-29 MED ORDER — TRIAMTERENE-HCTZ 37.5-25 MG PO TABS
1.0000 | ORAL_TABLET | Freq: Every day | ORAL | 3 refills | Status: DC
Start: 2023-04-29 — End: 2024-04-20

## 2023-04-29 NOTE — Assessment & Plan Note (Signed)
Normal exam with stable BP on metoprolol and hctz. No concerns or side effects to current medication. No change in regimen; continue low sodium diet.

## 2023-04-29 NOTE — Assessment & Plan Note (Signed)
LDL is  Lab Results  Component Value Date   LDLCALC 73 12/04/2021   Currently being treated with Crestor with good compliance and no concerns.

## 2023-04-29 NOTE — Assessment & Plan Note (Signed)
On Statin and aspirin with BB No chest pain or shortness of breath

## 2023-04-29 NOTE — Progress Notes (Signed)
Date:  04/29/2023   Name:  Jordan Baxter   DOB:  08-Aug-1951   MRN:  [id number]   Chief Complaint: Annual Exam Jordan Baxter is a 72 y.o. female who presents today for her Complete Annual Exam. She feels fairly well. She reports exercising - none. She reports she is sleeping poorly. Breast complaints - none. She is scheduled for shoulder surgery in August.  Mammogram: 06/2020 DEXA: 06/2016 Colonoscopy: 12/2019 repeat 10 yrs  Health Maintenance Due  Topic Date Due   COVID-19 Vaccine (1 - 2023-24 season) Never done    Immunization History  Administered Date(s) Administered   Fluad Quad(high Dose 65+) 08/10/2019   Influenza Split 10/04/2009   Influenza, High Dose Seasonal PF 08/25/2018   Influenza,inj,Quad PF,6+ Mos 07/30/2017   Influenza-Unspecified 07/22/2015   Pneumococcal Conjugate-13 08/22/2016   Pneumococcal Polysaccharide-23 11/13/2009, 07/30/2017   Tdap 05/03/2014   Zoster Recombinant(Shingrix) 11/03/2017, 01/01/2018   Zoster, Live 10/15/2011    Hypertension This is a chronic problem. The problem is controlled. Associated symptoms include shortness of breath. Pertinent negatives include no chest pain, headaches or palpitations. Past treatments include beta blockers and diuretics. Hypertensive end-organ damage includes CAD/MI. There is no history of kidney disease or CVA.  Gastroesophageal Reflux She complains of heartburn. She reports no abdominal pain, no chest pain, no coughing or no wheezing. This is a recurrent problem. The problem occurs rarely. Pertinent negatives include no fatigue. She has tried a PPI for the symptoms. Past procedures include an EGD.  Hyperlipidemia This is a chronic problem. The problem is controlled. Associated symptoms include shortness of breath. Pertinent negatives include no chest pain. Current antihyperlipidemic treatment includes statins. The current treatment provides significant improvement of lipids.  Asthma She complains of shortness of  breath. There is no cough or wheezing. This is a recurrent problem. The problem occurs intermittently. Associated symptoms include heartburn. Pertinent negatives include no chest pain, fever, headaches or trouble swallowing. Her past medical history is significant for asthma.    Lab Results  Component Value Date   NA 143 02/19/2022   K 4.5 02/19/2022   CO2 28 02/19/2022   GLUCOSE 97 02/19/2022   BUN 10 02/19/2022   CREATININE 0.74 02/19/2022   CALCIUM 9.3 02/19/2022   EGFR 87 02/19/2022   GFRNONAA 80 11/29/2020   Lab Results  Component Value Date   CHOL 161 12/04/2021   HDL 62 12/04/2021   LDLCALC 73 12/04/2021   TRIG 156 (H) 12/04/2021   CHOLHDL 2.6 12/04/2021   Lab Results  Component Value Date   TSH 1.490 12/04/2021   Lab Results  Component Value Date   HGBA1C 6.5 (H) 12/04/2021   Lab Results  Component Value Date   WBC 8.8 02/19/2022   HGB 13.2 02/19/2022   HCT 41.4 02/19/2022   MCV 90 02/19/2022   PLT 409 02/19/2022   Lab Results  Component Value Date   ALT 27 02/19/2022   AST 24 02/19/2022   ALKPHOS 62 02/19/2022   BILITOT 0.3 02/19/2022   No results found for: "25OHVITD2", "25OHVITD3", "VD25OH"   Review of Systems  Constitutional:  Negative for chills, fatigue and fever.  HENT:  Negative for congestion, hearing loss, tinnitus, trouble swallowing and voice change.   Eyes:  Negative for visual disturbance.  Respiratory:  Positive for shortness of breath. Negative for cough, chest tightness and wheezing.   Cardiovascular:  Negative for chest pain, palpitations and leg swelling.  Gastrointestinal:  Positive for heartburn. Negative for abdominal pain, constipation,  diarrhea and vomiting.  Endocrine: Negative for polydipsia and polyuria.  Genitourinary:  Negative for dysuria, frequency, genital sores, vaginal bleeding and vaginal discharge.  Musculoskeletal:  Positive for arthralgias and back pain. Negative for gait problem and joint swelling.  Skin:   Negative for color change and rash.  Neurological:  Negative for dizziness, tremors, light-headedness and headaches.  Hematological:  Negative for adenopathy. Does not bruise/bleed easily.  Psychiatric/Behavioral:  Negative for dysphoric mood and sleep disturbance. The patient is not nervous/anxious.     Patient Active Problem List   Diagnosis Date Noted   Asthma, stable, mild intermittent 04/29/2023   Rotator cuff tear arthropathy of right shoulder 12/04/2021   Trochanteric bursitis of left hip 09/11/2021   Cervical radiculopathy 09/11/2021   BMI 32.0-32.9,adult 05/21/2016   Tobacco use disorder, moderate, in sustained remission 04/23/2016   H/O cardiac catheterization 03/20/2015   Coronary artery disease involving native heart with angina pectoris (HCC) 03/19/2015   Essential (primary) hypertension 03/19/2015   Fibrositis 03/19/2015   Gastro-esophageal reflux disease without esophagitis 03/19/2015   Hyperlipidemia, mixed 03/19/2015    Allergies  Allergen Reactions   Azithromycin Diarrhea   Cephalosporins    Ibuprofen Nausea Only   Nsaids Other (See Comments)   Penicillins Other (See Comments)   Sulfa Antibiotics Photosensitivity    Past Surgical History:  Procedure Laterality Date   ABDOMINAL HYSTERECTOMY  1986   BACK SURGERY  08/20/2020   L4 L5 S1 nerve ablation [facility]   CARPAL TUNNEL RELEASE Right    CERVICAL FUSION  2015   CESAREAN SECTION  1984   COLONOSCOPY WITH ESOPHAGOGASTRODUODENOSCOPY (EGD)  08/31/2018   Dr. Earlean Polka   KNEE ARTHROSCOPY Right    PARTIAL HYSTERECTOMY     ROTATOR CUFF REPAIR Left    ROTATOR CUFF REPAIR W/ DISTAL CLAVICLE EXCISION Right 11/2021    Social History   Tobacco Use   Smoking status: Former    Current packs/day: 0.00    Average packs/day: 0.5 packs/day for 45.0 years (22.5 ttl pk-yrs)    Types: Cigarettes    Start date: 03/14/1967    Quit date: 03/13/2012    Years since quitting: 11.1   Smokeless tobacco: Never  Vaping Use    Vaping status: Never Used  Substance Use Topics   Alcohol use: No    Alcohol/week: 0.0 standard drinks of alcohol   Drug use: No     Medication list has been reviewed and updated.  Current Meds  Medication Sig   aspirin EC 81 MG tablet Take 81 mg by mouth daily. Swallow whole.   fluticasone-salmeterol (ADVAIR) 100-50 MCG/ACT AEPB Inhale 1 puff into the lungs 2 (two) times daily.   Ginger 500 MG CAPS Take by mouth.   glucosamine-chondroitin 500-400 MG tablet Take 1 tablet by mouth 3 (three) times daily.   metoprolol tartrate (LOPRESSOR) 25 MG tablet Take 25 mg by mouth 2 (two) times daily.    nitroGLYCERIN (NITROSTAT) 0.4 MG SL tablet Place 1 tablet under the tongue daily as needed.   omeprazole (PRILOSEC) 20 MG capsule TAKE 1 CAPSULE TWICE DAILY BEFORE MEALS   rosuvastatin (CRESTOR) 40 MG tablet Take 1 tablet by mouth daily.   triamcinolone (NASACORT) 55 MCG/ACT AERO nasal inhaler Place 2 sprays into the nose daily.   [DISCONTINUED] albuterol (VENTOLIN HFA) 108 (90 Base) MCG/ACT inhaler INHALE TWO PUFFs INTO THE LUNGS EVERY 6 HOURS AS NEEDED FOR WHEEZING OR SHORTNESS OF BREATH   [DISCONTINUED] oxyCODONE (OXY IR/ROXICODONE) 5 MG immediate release tablet Take  5-10 mg by mouth every 4 (four) hours as needed.   [DISCONTINUED] tiZANidine (ZANAFLEX) 4 MG tablet Take 4 mg by mouth every 6 (six) hours as needed.   [DISCONTINUED] triamterene-hydrochlorothiazide (MAXZIDE-25) 37.5-25 MG tablet Take 1 tablet by mouth daily.       04/29/2023    9:02 AM 10/29/2022   11:21 AM 10/09/2022    4:20 PM 08/15/2022   11:29 AM  GAD 7 : Generalized Anxiety Score  Nervous, Anxious, on Edge 0 0 0 0  Control/stop worrying 0 0 0 0  Worry too much - different things 0 0 0 0  Trouble relaxing 0 0 0 0  Restless 0 0 0 0  Easily annoyed or irritable 0 0 0 0  Afraid - awful might happen 0 0 0 0  Total GAD 7 Score 0 0 0 0  Anxiety Difficulty Not difficult at all Not difficult at all Not difficult at all Not  difficult at all       04/29/2023    9:02 AM 10/29/2022   11:21 AM 10/09/2022    4:20 PM  Depression screen PHQ 2/9  Decreased Interest 0 0 0  Down, Depressed, Hopeless 0 0 0  PHQ - 2 Score 0 0 0  Altered sleeping 3 0 0  Tired, decreased energy 1 0 0  Change in appetite 0 0 0  Feeling bad or failure about yourself  0 0 0  Trouble concentrating 0 0 0  Moving slowly or fidgety/restless 0 0 0  Suicidal thoughts 0 0 0  PHQ-9 Score 4 0 0  Difficult doing work/chores Not difficult at all Not difficult at all Not difficult at all    BP Readings from Last 3 Encounters:  04/29/23 122/78  10/29/22 122/60  10/09/22 128/78    Physical Exam Vitals and nursing note reviewed.  Constitutional:      General: She is not in acute distress.    Appearance: She is well-developed.  HENT:     Head: Normocephalic and atraumatic.     Right Ear: Tympanic membrane and ear canal normal.     Left Ear: Tympanic membrane and ear canal normal.     Nose:     Right Sinus: No maxillary sinus tenderness.     Left Sinus: No maxillary sinus tenderness.  Eyes:     General: No scleral icterus.       Right eye: No discharge.        Left eye: No discharge.     Conjunctiva/sclera: Conjunctivae normal.  Neck:     Thyroid: No thyromegaly.     Vascular: No carotid bruit.  Cardiovascular:     Rate and Rhythm: Normal rate and regular rhythm.     Pulses: Normal pulses.     Heart sounds: Normal heart sounds.  Pulmonary:     Effort: Pulmonary effort is normal. No respiratory distress.     Breath sounds: No wheezing.  Chest:  Breasts:    Right: No mass, nipple discharge, skin change or tenderness.     Left: No mass, nipple discharge, skin change or tenderness.  Abdominal:     General: Bowel sounds are normal.     Palpations: Abdomen is soft.     Tenderness: There is no abdominal tenderness.  Musculoskeletal:     Right shoulder: Bony tenderness present. Decreased range of motion.     Left shoulder: Bony  tenderness present. Decreased range of motion.     Cervical back: Normal range of motion.  No erythema.     Lumbar back: Bony tenderness present. Decreased range of motion.     Right lower leg: No edema.     Left lower leg: No edema.  Lymphadenopathy:     Cervical: No cervical adenopathy.  Skin:    General: Skin is warm and dry.     Findings: No rash.  Neurological:     Mental Status: She is alert and oriented to person, place, and time.     Cranial Nerves: No cranial nerve deficit.     Sensory: No sensory deficit.     Motor: Motor function is intact.     Coordination: Coordination is intact.     Deep Tendon Reflexes: Reflexes are normal and symmetric.  Psychiatric:        Attention and Perception: Attention normal.        Mood and Affect: Mood normal.     Wt Readings from Last 3 Encounters:  04/29/23 167 lb (75.8 kg)  10/29/22 163 lb (73.9 kg)  10/09/22 163 lb (73.9 kg)    BP 122/78   Pulse 71   Ht 5\' 2"  (1.575 m)   Wt 167 lb (75.8 kg)   SpO2 97%   BMI 30.54 kg/m   Assessment and Plan:  Problem List Items Addressed This Visit     Tobacco use disorder, moderate, in sustained remission    Seen by Pulmonary for asthma and nodule Continues to participate in LDCT screening      Hyperlipidemia, mixed    LDL is  Lab Results  Component Value Date   LDLCALC 73 12/04/2021   Currently being treated with Crestor with good compliance and no concerns.       Relevant Medications   triamterene-hydrochlorothiazide (MAXZIDE-25) 37.5-25 MG tablet   Other Relevant Orders   Lipid panel   Gastro-esophageal reflux disease without esophagitis (Chronic)    Reflux symptoms are minimal on current therapy - omeprazole. No red flag signs such as weight loss, n/v, melena Last EGD 2021 - ulcer bx neg      Relevant Orders   CBC with Differential/Platelet   Essential (primary) hypertension (Chronic)    Normal exam with stable BP on metoprolol and hctz. No concerns or side effects  to current medication. No change in regimen; continue low sodium diet.       Relevant Medications   triamterene-hydrochlorothiazide (MAXZIDE-25) 37.5-25 MG tablet   Other Relevant Orders   CBC with Differential/Platelet   Comprehensive metabolic panel   TSH   Coronary artery disease involving native heart with angina pectoris (HCC) (Chronic)    On Statin and aspirin with BB No chest pain or shortness of breath       Relevant Medications   triamterene-hydrochlorothiazide (MAXZIDE-25) 37.5-25 MG tablet   Asthma, stable, mild intermittent   Other Visit Diagnoses     Annual physical exam    -  Primary   Encounter for screening mammogram for breast cancer       can not schedule until after her shoulder surgery   Prediabetes       Relevant Orders   Hemoglobin A1c       No follow-ups on file.    Reubin Milan, MD [facility] [place]

## 2023-04-29 NOTE — Assessment & Plan Note (Signed)
Seen by Pulmonary for asthma and nodule Continues to participate in LDCT screening

## 2023-04-29 NOTE — Assessment & Plan Note (Signed)
Reflux symptoms are minimal on current therapy - omeprazole. No red flag signs such as weight loss, n/v, melena Last EGD 2021 - ulcer bx neg

## 2023-04-30 LAB — CBC WITH DIFFERENTIAL/PLATELET
Basophils Absolute: 0.1 10*3/uL (ref 0.0–0.2)
Basos: 1 %
EOS (ABSOLUTE): 0.3 10*3/uL (ref 0.0–0.4)
Eos: 6 %
Hematocrit: 40.2 % (ref 34.0–46.6)
Hemoglobin: 13.2 g/dL (ref 11.1–15.9)
Immature Grans (Abs): 0 10*3/uL (ref 0.0–0.1)
Immature Granulocytes: 0 %
Lymphocytes Absolute: 1.9 10*3/uL (ref 0.7–3.1)
Lymphs: 34 %
MCH: 29.1 pg (ref 26.6–33.0)
MCHC: 32.8 g/dL (ref 31.5–35.7)
MCV: 89 fL (ref 79–97)
Monocytes Absolute: 0.7 10*3/uL (ref 0.1–0.9)
Monocytes: 11 %
Neutrophils Absolute: 2.7 10*3/uL (ref 1.4–7.0)
Neutrophils: 48 %
Platelets: 339 10*3/uL (ref 150–450)
RBC: 4.54 x10E6/uL (ref 3.77–5.28)
RDW: 13.5 % (ref 11.7–15.4)
WBC: 5.7 10*3/uL (ref 3.4–10.8)

## 2023-04-30 LAB — TSH: TSH: 1.63 u[IU]/mL (ref 0.450–4.500)

## 2023-04-30 LAB — LIPID PANEL
Chol/HDL Ratio: 2.9 ratio (ref 0.0–4.4)
Cholesterol, Total: 152 mg/dL (ref 100–199)
HDL: 52 mg/dL (ref >39)
LDL Chol Calc (NIH): 73 mg/dL (ref 0–99)
Triglycerides: 160 mg/dL — ABNORMAL HIGH (ref 0–149)
VLDL Cholesterol Cal: 27 mg/dL (ref 5–40)

## 2023-04-30 LAB — COMPREHENSIVE METABOLIC PANEL
ALT: 22 IU/L (ref 0–32)
AST: 25 IU/L (ref 0–40)
Albumin: 4.3 g/dL (ref 3.8–4.8)
Alkaline Phosphatase: 72 IU/L (ref 44–121)
BUN/Creatinine Ratio: 19 (ref 12–28)
BUN: 15 mg/dL (ref 8–27)
Bilirubin Total: 0.3 mg/dL (ref 0.0–1.2)
CO2: 28 mmol/L (ref 20–29)
Calcium: 9.5 mg/dL (ref 8.7–10.3)
Chloride: 100 mmol/L (ref 96–106)
Creatinine, Ser: 0.79 mg/dL (ref 0.57–1.00)
Globulin, Total: 2.5 g/dL (ref 1.5–4.5)
Glucose: 96 mg/dL (ref 70–99)
Potassium: 3.7 mmol/L (ref 3.5–5.2)
Sodium: 141 mmol/L (ref 134–144)
Total Protein: 6.8 g/dL (ref 6.0–8.5)
eGFR: 80 mL/min/{1.73_m2} (ref >59)

## 2023-04-30 LAB — HEMOGLOBIN A1C
Est. average glucose Bld gHb Est-mCnc: 137 mg/dL
Hgb A1c MFr Bld: 6.4 % — ABNORMAL HIGH (ref 4.8–5.6)

## 2023-05-15 DIAGNOSIS — M24511 Contracture, right shoulder: Secondary | ICD-10-CM | POA: Diagnosis not present

## 2023-05-15 DIAGNOSIS — Z87891 Personal history of nicotine dependence: Secondary | ICD-10-CM | POA: Diagnosis not present

## 2023-05-15 DIAGNOSIS — Z7982 Long term (current) use of aspirin: Secondary | ICD-10-CM | POA: Diagnosis not present

## 2023-05-15 DIAGNOSIS — M75101 Unspecified rotator cuff tear or rupture of right shoulder, not specified as traumatic: Secondary | ICD-10-CM | POA: Diagnosis not present

## 2023-05-15 DIAGNOSIS — Z955 Presence of coronary angioplasty implant and graft: Secondary | ICD-10-CM | POA: Diagnosis not present

## 2023-05-15 DIAGNOSIS — K219 Gastro-esophageal reflux disease without esophagitis: Secondary | ICD-10-CM | POA: Diagnosis not present

## 2023-05-15 DIAGNOSIS — G8918 Other acute postprocedural pain: Secondary | ICD-10-CM | POA: Diagnosis not present

## 2023-05-15 DIAGNOSIS — M19011 Primary osteoarthritis, right shoulder: Secondary | ICD-10-CM | POA: Diagnosis not present

## 2023-05-15 DIAGNOSIS — E785 Hyperlipidemia, unspecified: Secondary | ICD-10-CM | POA: Diagnosis not present

## 2023-05-15 DIAGNOSIS — I2510 Atherosclerotic heart disease of native coronary artery without angina pectoris: Secondary | ICD-10-CM | POA: Diagnosis not present

## 2023-05-15 DIAGNOSIS — I10 Essential (primary) hypertension: Secondary | ICD-10-CM | POA: Diagnosis not present

## 2023-05-16 DIAGNOSIS — Z7982 Long term (current) use of aspirin: Secondary | ICD-10-CM | POA: Diagnosis not present

## 2023-05-16 DIAGNOSIS — K219 Gastro-esophageal reflux disease without esophagitis: Secondary | ICD-10-CM | POA: Diagnosis not present

## 2023-05-16 DIAGNOSIS — Z87891 Personal history of nicotine dependence: Secondary | ICD-10-CM | POA: Diagnosis not present

## 2023-05-16 DIAGNOSIS — E785 Hyperlipidemia, unspecified: Secondary | ICD-10-CM | POA: Diagnosis not present

## 2023-05-16 DIAGNOSIS — I10 Essential (primary) hypertension: Secondary | ICD-10-CM | POA: Diagnosis not present

## 2023-05-16 DIAGNOSIS — I2510 Atherosclerotic heart disease of native coronary artery without angina pectoris: Secondary | ICD-10-CM | POA: Diagnosis not present

## 2023-05-16 DIAGNOSIS — Z955 Presence of coronary angioplasty implant and graft: Secondary | ICD-10-CM | POA: Diagnosis not present

## 2023-05-16 DIAGNOSIS — M75101 Unspecified rotator cuff tear or rupture of right shoulder, not specified as traumatic: Secondary | ICD-10-CM | POA: Diagnosis not present

## 2023-05-16 DIAGNOSIS — M19011 Primary osteoarthritis, right shoulder: Secondary | ICD-10-CM | POA: Diagnosis not present

## 2023-05-28 DIAGNOSIS — M25511 Pain in right shoulder: Secondary | ICD-10-CM | POA: Diagnosis not present

## 2023-05-29 DIAGNOSIS — Z96611 Presence of right artificial shoulder joint: Secondary | ICD-10-CM | POA: Diagnosis not present

## 2023-06-02 DIAGNOSIS — Z96611 Presence of right artificial shoulder joint: Secondary | ICD-10-CM | POA: Diagnosis not present

## 2023-06-04 DIAGNOSIS — Z96611 Presence of right artificial shoulder joint: Secondary | ICD-10-CM | POA: Diagnosis not present

## 2023-06-08 DIAGNOSIS — Z96611 Presence of right artificial shoulder joint: Secondary | ICD-10-CM | POA: Diagnosis not present

## 2023-06-11 DIAGNOSIS — Z96611 Presence of right artificial shoulder joint: Secondary | ICD-10-CM | POA: Diagnosis not present

## 2023-06-23 DIAGNOSIS — Z1231 Encounter for screening mammogram for malignant neoplasm of breast: Secondary | ICD-10-CM | POA: Diagnosis not present

## 2023-06-23 DIAGNOSIS — Z96611 Presence of right artificial shoulder joint: Secondary | ICD-10-CM | POA: Diagnosis not present

## 2023-06-25 DIAGNOSIS — Z96611 Presence of right artificial shoulder joint: Secondary | ICD-10-CM | POA: Diagnosis not present

## 2023-06-30 DIAGNOSIS — Z96611 Presence of right artificial shoulder joint: Secondary | ICD-10-CM | POA: Diagnosis not present

## 2023-07-02 DIAGNOSIS — Z96611 Presence of right artificial shoulder joint: Secondary | ICD-10-CM | POA: Diagnosis not present

## 2023-07-07 DIAGNOSIS — Z96611 Presence of right artificial shoulder joint: Secondary | ICD-10-CM | POA: Diagnosis not present

## 2023-07-09 DIAGNOSIS — Z96611 Presence of right artificial shoulder joint: Secondary | ICD-10-CM | POA: Diagnosis not present

## 2023-07-13 DIAGNOSIS — Z96611 Presence of right artificial shoulder joint: Secondary | ICD-10-CM | POA: Diagnosis not present

## 2023-07-16 DIAGNOSIS — Z96611 Presence of right artificial shoulder joint: Secondary | ICD-10-CM | POA: Diagnosis not present

## 2023-07-17 DIAGNOSIS — M545 Low back pain, unspecified: Secondary | ICD-10-CM | POA: Diagnosis not present

## 2023-07-17 DIAGNOSIS — E785 Hyperlipidemia, unspecified: Secondary | ICD-10-CM | POA: Diagnosis not present

## 2023-07-17 DIAGNOSIS — R197 Diarrhea, unspecified: Secondary | ICD-10-CM | POA: Diagnosis not present

## 2023-07-17 DIAGNOSIS — K219 Gastro-esophageal reflux disease without esophagitis: Secondary | ICD-10-CM | POA: Diagnosis not present

## 2023-07-17 DIAGNOSIS — S76911A Strain of unspecified muscles, fascia and tendons at thigh level, right thigh, initial encounter: Secondary | ICD-10-CM | POA: Diagnosis not present

## 2023-07-17 DIAGNOSIS — I10 Essential (primary) hypertension: Secondary | ICD-10-CM | POA: Diagnosis not present

## 2023-07-17 DIAGNOSIS — I2510 Atherosclerotic heart disease of native coronary artery without angina pectoris: Secondary | ICD-10-CM | POA: Diagnosis not present

## 2023-07-17 DIAGNOSIS — Z87891 Personal history of nicotine dependence: Secondary | ICD-10-CM | POA: Diagnosis not present

## 2023-07-17 DIAGNOSIS — M1990 Unspecified osteoarthritis, unspecified site: Secondary | ICD-10-CM | POA: Diagnosis not present

## 2023-07-22 DIAGNOSIS — Z96611 Presence of right artificial shoulder joint: Secondary | ICD-10-CM | POA: Diagnosis not present

## 2023-07-30 DIAGNOSIS — Z96611 Presence of right artificial shoulder joint: Secondary | ICD-10-CM | POA: Diagnosis not present

## 2023-08-05 DIAGNOSIS — Z96611 Presence of right artificial shoulder joint: Secondary | ICD-10-CM | POA: Diagnosis not present

## 2023-08-12 DIAGNOSIS — Z96611 Presence of right artificial shoulder joint: Secondary | ICD-10-CM | POA: Diagnosis not present

## 2023-09-07 DIAGNOSIS — I10 Essential (primary) hypertension: Secondary | ICD-10-CM | POA: Diagnosis not present

## 2023-09-07 DIAGNOSIS — M47817 Spondylosis without myelopathy or radiculopathy, lumbosacral region: Secondary | ICD-10-CM | POA: Diagnosis not present

## 2023-09-16 DIAGNOSIS — I2510 Atherosclerotic heart disease of native coronary artery without angina pectoris: Secondary | ICD-10-CM | POA: Diagnosis not present

## 2023-09-16 DIAGNOSIS — R072 Precordial pain: Secondary | ICD-10-CM | POA: Diagnosis not present

## 2023-09-16 DIAGNOSIS — E7800 Pure hypercholesterolemia, unspecified: Secondary | ICD-10-CM | POA: Diagnosis not present

## 2023-09-22 DIAGNOSIS — R072 Precordial pain: Secondary | ICD-10-CM | POA: Diagnosis not present

## 2023-09-23 ENCOUNTER — Ambulatory Visit: Payer: Medicare PPO

## 2023-09-23 DIAGNOSIS — Z0000 Encounter for general adult medical examination without abnormal findings: Secondary | ICD-10-CM

## 2023-09-23 DIAGNOSIS — R109 Unspecified abdominal pain: Secondary | ICD-10-CM

## 2023-09-23 DIAGNOSIS — Z780 Asymptomatic menopausal state: Secondary | ICD-10-CM | POA: Diagnosis not present

## 2023-09-23 NOTE — Patient Instructions (Addendum)
Ms. Burchill , Thank you for taking time to come for your Medicare Wellness Visit. I appreciate your ongoing commitment to your health goals. Please review the following plan we discussed and let me know if I can assist you in the future.   Referrals/Orders/Follow-Ups/Clinician Recommendations: REFERRALS SENT FOR BONE DENSITY SCAN & GI CONSULT  This is a list of the screening recommended for you and due dates:  Health Maintenance  Topic Date Due   COVID-19 Vaccine (1 - 2023-24 season) Never done   Flu Shot  01/11/2024*   Screening for Lung Cancer  12/05/2023   DTaP/Tdap/Td vaccine (2 - Td or Tdap) 05/03/2024   Mammogram  06/24/2024   Medicare Annual Wellness Visit  09/22/2024   Colon Cancer Screening  01/02/2030   Pneumonia Vaccine  Completed   DEXA scan (bone density measurement)  Completed   Hepatitis C Screening  Completed   Zoster (Shingles) Vaccine  Completed   HPV Vaccine  Aged Out  *Topic was postponed. The date shown is not the original due date.    Advanced directives: (ACP Link)Information on Advanced Care Planning can be found at [facility] [place] Advance Health Care Directives Advance Health Care Directives ([email or web address]   Next Medicare Annual Wellness Visit scheduled for next year: Yes   09/28/24 @ 9:30 AM BY VIDEO

## 2023-09-23 NOTE — Progress Notes (Signed)
Subjective:   Jordan Baxter is a 72 y.o. female who presents for Medicare Annual (Subsequent) preventive examination.  Visit Complete: Virtual I connected with  Jordan Baxter on 09/23/23 by a audio enabled telemedicine application and verified that I am speaking with the correct person using two identifiers.  Patient Location: Home  Provider Location: Office/Clinic  I discussed the limitations of evaluation and management by telemedicine. The patient expressed understanding and agreed to proceed.  Vital Signs: Because this visit was a virtual/telehealth visit, some criteria may be missing or patient reported. Any vitals not documented were not able to be obtained and vitals that have been documented are patient reported.      Objective:    There were no vitals filed for this visit. There is no height or weight on file to calculate BMI.     09/23/2023    9:57 AM 09/10/2022    8:28 AM 09/09/2021    8:10 AM 09/03/2020    8:29 AM 08/31/2019    8:23 AM 07/30/2017    9:53 AM  Advanced Directives  Does Patient Have a Medical Advance Directive? No Yes Yes Yes Yes Yes  Type of Advance Directive  Living will Healthcare Power of [place];Living will Healthcare Power of [place];Living will Healthcare Power of [place];Living will Healthcare Power of [place];Living will  Copy of Healthcare Power of Attorney in Chart?   No - copy requested No - copy requested No - copy requested No - copy requested  Would patient like information on creating a medical advance directive? No - Patient declined         Current Medications (verified) Outpatient Encounter Medications as of 09/23/2023  Medication Sig   aspirin EC 81 MG tablet Take 81 mg by mouth daily. Swallow whole.   ezetimibe (ZETIA) 10 MG tablet Take 10 mg by mouth daily.   metoprolol tartrate (LOPRESSOR) 25 MG tablet Take 25 mg by mouth 2 (two) times daily.    nitroGLYCERIN (NITROSTAT) 0.4 MG SL tablet Place 1 tablet under the tongue  daily as needed.   omeprazole (PRILOSEC) 20 MG capsule TAKE 1 CAPSULE TWICE DAILY BEFORE MEALS   rosuvastatin (CRESTOR) 40 MG tablet Take 1 tablet by mouth daily.   triamcinolone (NASACORT) 55 MCG/ACT AERO nasal inhaler Place 2 sprays into the nose daily.   triamterene-hydrochlorothiazide (MAXZIDE-25) 37.5-25 MG tablet Take 1 tablet by mouth daily.   fluticasone-salmeterol (ADVAIR) 100-50 MCG/ACT AEPB Inhale 1 puff into the lungs 2 (two) times daily. (Patient not taking: Reported on 09/23/2023)   Ginger 500 MG CAPS Take by mouth. (Patient not taking: Reported on 09/23/2023)   glucosamine-chondroitin 500-400 MG tablet Take 1 tablet by mouth 3 (three) times daily. (Patient not taking: Reported on 09/23/2023)   No facility-administered encounter medications on file as of 09/23/2023.    Allergies (verified) Azithromycin, Cephalosporins, Ibuprofen, Nsaids, Penicillins, and Sulfa antibiotics   History: Past Medical History:  Diagnosis Date   Allergy    Arthritis    Fibrositis    GERD (gastroesophageal reflux disease)    Hyperlipidemia    Hypertension    Past Surgical History:  Procedure Laterality Date   ABDOMINAL HYSTERECTOMY  1986   BACK SURGERY  08/20/2020   L4 L5 S1 nerve ablation [facility]   CARPAL TUNNEL RELEASE Right    CERVICAL FUSION  2015   CESAREAN SECTION  1984   COLONOSCOPY WITH ESOPHAGOGASTRODUODENOSCOPY (EGD)  08/31/2018   Dr. Earlean Polka   KNEE ARTHROSCOPY Right    PARTIAL HYSTERECTOMY  ROTATOR CUFF REPAIR Left    ROTATOR CUFF REPAIR W/ DISTAL CLAVICLE EXCISION Right 11/2021   Family History  Problem Relation Age of Onset   CAD Mother    Heart disease Mother    Cancer Father    Heart disease Sister    Heart attack Sister    Heart disease Brother    Heart attack Sister    Arthritis Paternal Grandmother    Social History   Socioeconomic History   Marital status: Divorced    Spouse name: Not on file   Number of children: 1   Years of education: some  college   Highest education level: Not on file  Occupational History   Occupation: retired  Tobacco Use   Smoking status: Former    Current packs/day: 0.00    Average packs/day: 0.5 packs/day for 45.0 years (22.5 ttl pk-yrs)    Types: Cigarettes    Start date: 03/14/1967    Quit date: 03/13/2012    Years since quitting: 11.5   Smokeless tobacco: Never  Vaping Use   Vaping status: Never Used  Substance and Sexual Activity   Alcohol use: No    Alcohol/week: 0.0 standard drinks of alcohol   Drug use: No   Sexual activity: Never  Other Topics Concern   Not on file  Social History Narrative   Pt lives alone   Social Determinants of Health   Financial Resource Strain: Low Risk  (09/23/2023)   Overall Financial Resource Strain (CARDIA)    Difficulty of Paying Living Expenses: Not hard at all  Food Insecurity: No Food Insecurity (09/23/2023)   Hunger Vital Sign    Worried About Running Out of Food in the Last Year: Never true    Ran Out of Food in the Last Year: Never true  Transportation Needs: No Transportation Needs (09/23/2023)   PRAPARE - Administrator, Civil Service (Medical): No    Lack of Transportation (Non-Medical): No  Physical Activity: Insufficiently Active (09/23/2023)   Exercise Vital Sign    Days of Exercise per Week: 3 days    Minutes of Exercise per Session: 30 min  Stress: No Stress Concern Present (09/23/2023)   [employer] of Occupational Health - Occupational Stress Questionnaire    Feeling of Stress : Not at all  Social Connections: Moderately Integrated (09/23/2023)   Social Connection and Isolation Panel [NHANES]    Frequency of Communication with Friends and Family: More than three times a week    Frequency of Social Gatherings with Friends and Family: Twice a week    Attends Religious Services: More than 4 times per year    Active Member of [employer] or Organizations: Yes    Attends Engineer, structural: More than 4 times per  year    Marital Status: Divorced    Tobacco Counseling Counseling given: Not Answered   Clinical Intake:  Pre-visit preparation completed: Yes  Pain : No/denies pain     BMI - recorded: 30.5 Nutritional Status: BMI > 30  Obese Nutritional Risks: None Diabetes: No  How often do you need to have someone help you when you read instructions, pamphlets, or other written materials from your doctor or pharmacy?: 1 - Never  Interpreter Needed?: No  Information entered by :: Kennedy Bucker, LPN   Activities of Daily Living    09/23/2023    9:58 AM  In your present state of health, do you have any difficulty performing the following activities:  Hearing? 0  Vision? 0  Difficulty concentrating or making decisions? 0  Walking or climbing stairs? 0  Dressing or bathing? 0  Doing errands, shopping? 0  Preparing Food and eating ? N  Using the Toilet? N  In the past six months, have you accidently leaked urine? N  Do you have problems with loss of bowel control? N  Managing your Medications? N  Managing your Finances? N  Housekeeping or managing your Housekeeping? N    Patient Care Team: Reubin Milan, MD as PCP - General (Internal Medicine) Loren Racer, MD as Referring Physician (Physical Medicine and Rehabilitation) Laretta Bolster, MD as Referring Physician (Cardiology)  Indicate any recent Medical Services you may have received from other than [facility] providers in the past year (date may be approximate).     Assessment:   This is a routine wellness examination for [place].  Hearing/Vision screen Hearing Screening - Comments:: NO AIDS Vision Screening - Comments:: GLASSES FOR DRIVING- [facility]   Goals Addressed             This Visit's Progress    DIET - EAT MORE FRUITS AND VEGETABLES         Depression Screen    09/23/2023    9:54 AM 04/29/2023    9:02 AM 10/29/2022   11:21 AM 10/09/2022    4:20 PM 09/10/2022    8:29 AM 08/15/2022   11:29 AM  02/19/2022   10:44 AM  PHQ 2/9 Scores  PHQ - 2 Score 0 0 0 0 0 0 0  PHQ- 9 Score 0 4 0 0 6 0 2    Fall Risk    09/23/2023    9:58 AM 04/29/2023    9:02 AM 10/29/2022   11:19 AM 10/09/2022    4:20 PM 09/10/2022    8:28 AM  Fall Risk   Falls in the past year? 0 0 0 0 0  Number falls in past yr: 0 0 0 0 0  Injury with Fall? 0 0 0 0 0  Risk for fall due to : No Fall Risks No Fall Risks No Fall Risks No Fall Risks No Fall Risks  Follow up Falls prevention discussed;Falls evaluation completed Falls evaluation completed Falls evaluation completed Falls evaluation completed Falls evaluation completed    MEDICARE RISK AT HOME: Medicare Risk at Home Any stairs in or around the home?: Yes If so, are there any without handrails?: No Home free of loose throw rugs in walkways, pet beds, electrical cords, etc?: Yes Adequate lighting in your home to reduce risk of falls?: Yes Life alert?: No Use of a cane, walker or w/c?: No Grab bars in the bathroom?: Yes Shower chair or bench in shower?: Yes Elevated toilet seat or a handicapped toilet?: Yes  TIMED UP AND GO:  Was the test performed?  No    Cognitive Function:        09/23/2023    9:59 AM 09/10/2022    8:30 AM 08/31/2019    8:26 AM 08/25/2018   11:45 AM 07/30/2017    9:57 AM  6CIT Screen  What Year? 0 points 0 points 0 points 0 points 0 points  What month? 0 points 0 points 0 points 0 points 0 points  What time? 0 points 0 points 0 points 0 points 0 points  Count back from 20 0 points 0 points 0 points 0 points 0 points  Months in reverse 0 points 0 points 0 points 0 points 0 points  Repeat  phrase 0 points 0 points 2 points 0 points 0 points  Total Score 0 points 0 points 2 points 0 points 0 points    Immunizations Immunization History  Administered Date(s) Administered   Fluad Quad(high Dose 65+) 08/10/2019   Influenza Split 10/04/2009   Influenza, High Dose Seasonal PF 08/25/2018   Influenza,inj,Quad PF,6+ Mos  07/30/2017   Influenza-Unspecified 07/22/2015   Pneumococcal Conjugate-13 08/22/2016   Pneumococcal Polysaccharide-23 11/13/2009, 07/30/2017   Tdap 05/03/2014   Zoster Recombinant(Shingrix) 11/03/2017, 01/01/2018   Zoster, Live 10/15/2011    TDAP status: Up to date  Flu Vaccine status: Declined, Education has been provided regarding the importance of this vaccine but patient still declined. Advised may receive this vaccine at local pharmacy or Health Dept. Aware to provide a copy of the vaccination record if obtained from local pharmacy or Health Dept. Verbalized acceptance and understanding.  Pneumococcal vaccine status: Up to date  Covid-19 vaccine status: Declined, Education has been provided regarding the importance of this vaccine but patient still declined. Advised may receive this vaccine at local pharmacy or Health Dept.or vaccine clinic. Aware to provide a copy of the vaccination record if obtained from local pharmacy or Health Dept. Verbalized acceptance and understanding.  Qualifies for Shingles Vaccine? Yes   Zostavax completed Yes   Shingrix Completed?: Yes  Screening Tests Health Maintenance  Topic Date Due   COVID-19 Vaccine (1 - 2023-24 season) Never done   INFLUENZA VACCINE  01/11/2024 (Originally 05/14/2023)   Lung Cancer Screening  12/05/2023   DTaP/Tdap/Td (2 - Td or Tdap) 05/03/2024   MAMMOGRAM  06/24/2024   Medicare Annual Wellness (AWV)  09/22/2024   Colonoscopy  01/02/2030   Pneumonia Vaccine 58+ Years old  Completed   DEXA SCAN  Completed   Hepatitis C Screening  Completed   Zoster Vaccines- Shingrix  Completed   HPV VACCINES  Aged Out    Health Maintenance  Health Maintenance Due  Topic Date Due   COVID-19 Vaccine (1 - 2023-24 season) Never done    Colorectal cancer screening: Type of screening: Colonoscopy. Completed 01/03/20. Repeat every 10 years- WILL AGE OUT- REFERRAL SENT TO DR.PATEL IN [place]  Mammogram status: Completed 06/25/23. Repeat  every year  Bone Density status: Completed 07/01/16. Results reflect: Bone density results: OSTEOPENIA. Repeat every 5 years.- REFERRAL SENT TODAY  Lung Cancer Screening: (Low Dose CT Chest recommended if Age 37-80 years, 20 pack-year currently smoking OR have quit w/in 15years.) does qualify.   Lung Cancer Screening Referral: CT SCAN DONE 12/04/22  Additional Screening:  Hepatitis C Screening: does qualify; Completed 02/19/17  Vision Screening: Recommended annual ophthalmology exams for early detection of glaucoma and other disorders of the eye. Is the patient up to date with their annual eye exam?  Yes  Who is the provider or what is the name of the office in which the patient attends annual eye exams? [facility] If pt is not established with a provider, would they like to be referred to a provider to establish care? No .   Dental Screening: Recommended annual dental exams for proper oral hygiene   Community Resource Referral / Chronic Care Management: CRR required this visit?  No   CCM required this visit?  No     Plan:     I have personally reviewed and noted the following in the patient's chart:   Medical and social history Use of alcohol, tobacco or illicit drugs  Current medications and supplements including opioid prescriptions. Patient is not  currently taking opioid prescriptions. Functional ability and status Nutritional status Physical activity Advanced directives List of other physicians Hospitalizations, surgeries, and ER visits in previous 12 months Vitals Screenings to include cognitive, depression, and falls Referrals and appointments  In addition, I have reviewed and discussed with patient certain preventive protocols, quality metrics, and best practice recommendations. A written personalized care plan for preventive services as well as general preventive health recommendations were provided to patient.     Hal Hope, LPN   16/07/9603   After  Visit Summary: (MyChart) Due to this being a telephonic visit, the after visit summary with patients personalized plan was offered to patient via MyChart   Nurse Notes: REFERRALS SENT FOR GI CONSULT, DEXA SCAN

## 2023-09-24 ENCOUNTER — Ambulatory Visit: Payer: Self-pay | Admitting: *Deleted

## 2023-09-24 ENCOUNTER — Other Ambulatory Visit: Payer: Self-pay | Admitting: Internal Medicine

## 2023-09-24 ENCOUNTER — Other Ambulatory Visit: Payer: Self-pay | Admitting: *Deleted

## 2023-09-24 DIAGNOSIS — K219 Gastro-esophageal reflux disease without esophagitis: Secondary | ICD-10-CM

## 2023-09-24 MED ORDER — OMEPRAZOLE 20 MG PO CPDR
DELAYED_RELEASE_CAPSULE | ORAL | 1 refills | Status: DC
Start: 2023-09-24 — End: 2024-03-08

## 2023-09-24 NOTE — Telephone Encounter (Signed)
Medication Refill -  Most Recent Primary Care Visit:  Provider: Hal Hope  Department: [facility]  Visit Type: MEDICARE AWV, SEQUENTIAL  Date: 09/23/2023  Medication: omeprazole (PRILOSEC) 20 MG capsule   Has the patient contacted their pharmacy? Yes  Is this the correct pharmacy for this prescription? Yes If no, delete pharmacy and type the correct one.  This is the patient's preferred pharmacy:  [facility] - [place], [place] - [address] [place] [address] Phone: [phone] Fax: [phone]   Has the prescription been filled recently? Yes  Is the patient out of the medication? Yes  Has the patient been seen for an appointment in the last year OR does the patient have an upcoming appointment? Yes  Can we respond through MyChart? No  Agent: Please be advised that Rx refills may take up to 3 business days. We ask that you follow-up with your pharmacy.

## 2023-09-24 NOTE — Telephone Encounter (Signed)
  Chief Complaint: Chest pain Symptoms: "Below adams apple."  At times radiates to ears, worse at night.States cardiologist referred her to GI, can't get in for a while. Frequency: 3 months Pertinent Negatives: Patient denies  Disposition: [] ED /[] Urgent Care (no appt availability in office) / [] Appointment(In office/virtual)/ []  [place] [facility]/ [] Home Care/ [] Refused Recommended Disposition /[] [place] [facility]/ [x]  Follow-up with PCP Additional Notes:   Pt initially calling for rx of Omeprazole. Mentioned chest pain and sent to triage. Pt states has seen cardiology, advised reflux, heartburn and to ask PCP to refill Omeprazole. Med sent to [facility] refill pool. Please advise if pt needs to be seen by PCP. Reason for Disposition  [1] Patient says chest pain feels exactly the same as previously diagnosed "heartburn" AND [2] describes burning in chest AND [3] accompanying sour taste in mouth  Answer Assessment - Initial Assessment Questions 1. LOCATION: "Where does it hurt?"       "Right below apples apple 2. RADIATION: "Does the pain go anywhere else?" (e.g., into neck, jaw, arms, back)     Sometimes to ears 3. ONSET: "When did the chest pain begin?" (Minutes, hours or days)      3 months ago 4. PATTERN: "Does the pain come and go, or has it been constant since it started?"  "Does it get worse with exertion?"      Comes and goes 5. DURATION: "How long does it last" (e.g., seconds, minutes, hours)     Varies 6. SEVERITY: "How bad is the pain?"  (e.g., Scale 1-10; mild, moderate, or severe)    - MILD (1-3): doesn't interfere with normal activities     - MODERATE (4-7): interferes with normal activities or awakens from sleep    - SEVERE (8-10): excruciating pain, unable to do any normal activities       Varies 9. CAUSE: "What do you think is causing the chest pain?"     Reflux 10. OTHER SYMPTOMS: "Do you have any other symptoms?" (e.g., dizziness, nausea, vomiting, sweating,  fever, difficulty breathing, cough)       Indigestion.  Protocols used: Chest Pain-A-AH

## 2023-09-24 NOTE — Telephone Encounter (Signed)
Requested Prescriptions  Pending Prescriptions Disp Refills   omeprazole (PRILOSEC) 20 MG capsule 180 capsule 1    Sig: TAKE 1 CAPSULE TWICE DAILY BEFORE MEALS     Gastroenterology: Proton Pump Inhibitors Passed - 09/24/2023 10:08 AM      Passed - Valid encounter within last 12 months    Recent Outpatient Visits           4 months ago Annual physical exam   [facility] at [facility], Nyoka Cowden, MD   11 months ago Subacute cough   [place] [facility], [facility]   11 months ago Flu-like symptoms   [place] [facility], [facility]   1 year ago Subacute cough   [place] [facility] at [facility], Nyoka Cowden, MD   1 year ago Acute non-recurrent maxillary sinusitis   [facility] at [facility], Nyoka Cowden, MD

## 2023-09-24 NOTE — Telephone Encounter (Signed)
Requested Prescriptions  Pending Prescriptions Disp Refills   omeprazole (PRILOSEC) 20 MG capsule 180 capsule 1    Sig: TAKE 1 CAPSULE TWICE DAILY BEFORE MEALS     Gastroenterology: Proton Pump Inhibitors Passed - 09/24/2023 12:22 PM      Passed - Valid encounter within last 12 months    Recent Outpatient Visits           4 months ago Annual physical exam   [place] [facility] at [facility], Nyoka Cowden, MD   11 months ago Subacute cough   [place] [facility] at [facility], [facility]   11 months ago Flu-like symptoms   [place] [facility], MD   1 year ago Subacute cough   [place] [facility] at [facility], Nyoka Cowden, MD   1 year ago Acute non-recurrent maxillary sinusitis   [facility] at [facility], Nyoka Cowden, MD

## 2023-09-24 NOTE — Telephone Encounter (Signed)
Patient has been evaluated for "chest pain" and told it was acid reflux per cardiologist. Refilled Omeprazole.  - Angelia Hazell

## 2023-09-28 ENCOUNTER — Ambulatory Visit: Payer: Self-pay

## 2023-09-28 ENCOUNTER — Encounter: Payer: Self-pay | Admitting: Internal Medicine

## 2023-09-28 ENCOUNTER — Ambulatory Visit: Payer: Medicare PPO | Admitting: Internal Medicine

## 2023-09-28 VITALS — BP 120/78 | HR 68 | Ht 62.0 in | Wt 166.0 lb

## 2023-09-28 DIAGNOSIS — E782 Mixed hyperlipidemia: Secondary | ICD-10-CM

## 2023-09-28 DIAGNOSIS — I10 Essential (primary) hypertension: Secondary | ICD-10-CM | POA: Diagnosis not present

## 2023-09-28 DIAGNOSIS — K219 Gastro-esophageal reflux disease without esophagitis: Secondary | ICD-10-CM | POA: Diagnosis not present

## 2023-09-28 DIAGNOSIS — R42 Dizziness and giddiness: Secondary | ICD-10-CM | POA: Diagnosis not present

## 2023-09-28 MED ORDER — MECLIZINE HCL 12.5 MG PO TABS: 12.5000 mg | ORAL_TABLET | Freq: Three times a day (TID) | ORAL | 0 refills | Status: AC | PRN

## 2023-09-28 NOTE — Assessment & Plan Note (Signed)
 Controlled BP with normal exam. Current regimen is metoprolol and hctz. Will continue same medications; encourage continued reduced sodium diet.

## 2023-09-28 NOTE — Progress Notes (Signed)
Date:  09/28/2023   Name:  Jamarra Dautel   DOB:  [birth year]   MRN:  [id number]   Chief Complaint: Chest Pain (Followed by Cardiology. Patient has complaints of chest pain on and off and dizziness.) and Weight Loss  Dizziness This is a new problem. Episode onset: about 10 days ago. The problem has been waxing and waning. Associated symptoms include chest pain. Pertinent negatives include no chills, congestion, coughing, fatigue, fever, headaches, nausea, swollen glands, visual change or vomiting. The symptoms are aggravated by bending and twisting. She has tried nothing for the symptoms.  She was also concerned about weight loss - her scales at home went from 160 lbs 2 days ago to 152 lbs today.  Our scales show a one lb loss.  Her scales are inaccurate. Chest pain - she had a recent cardiac stress test at [facility] but she has not been able to view her MyChart.  She thinks her chest pain may be her esophagus. She will see GI next month. Myocardial perfusion imaging is normal. No evidence of ischemia or  infarction.  Summed severity score is  5 .  Artifacts noted:   breast attenuation .  Overall left ventricular systolic function was Normal without regional  wall motion abnormalities.   Review of Systems  Constitutional:  Negative for chills, fatigue and fever.  HENT:  Negative for congestion, sinus pressure and trouble swallowing.   Respiratory:  Negative for cough, chest tightness, shortness of breath and wheezing.   Cardiovascular:  Positive for chest pain. Negative for palpitations.  Gastrointestinal:  Negative for constipation, nausea and vomiting.  Neurological:  Positive for dizziness. Negative for headaches.  Psychiatric/Behavioral:  Negative for dysphoric mood and sleep disturbance. The patient is not nervous/anxious.      Lab Results  Component Value Date   NA 141 04/29/2023   K 3.7 04/29/2023   CO2 28 04/29/2023   GLUCOSE 96 04/29/2023   BUN 15 04/29/2023   CREATININE 0.79  04/29/2023   CALCIUM 9.5 04/29/2023   EGFR 80 04/29/2023   GFRNONAA 80 11/29/2020   Lab Results  Component Value Date   CHOL 152 04/29/2023   HDL 52 04/29/2023   LDLCALC 73 04/29/2023   TRIG 160 (H) 04/29/2023   CHOLHDL 2.9 04/29/2023   Lab Results  Component Value Date   TSH 1.630 04/29/2023   Lab Results  Component Value Date   HGBA1C 6.4 (H) 04/29/2023   Lab Results  Component Value Date   WBC 5.7 04/29/2023   HGB 13.2 04/29/2023   HCT 40.2 04/29/2023   MCV 89 04/29/2023   PLT 339 04/29/2023   Lab Results  Component Value Date   ALT 22 04/29/2023   AST 25 04/29/2023   ALKPHOS 72 04/29/2023   BILITOT 0.3 04/29/2023   No results found for: "25OHVITD2", "25OHVITD3", "VD25OH"   Patient Active Problem List   Diagnosis Date Noted   Asthma, stable, mild intermittent 04/29/2023   Rotator cuff tear arthropathy of right shoulder 12/04/2021   Trochanteric bursitis of left hip 09/11/2021   Cervical radiculopathy 09/11/2021   BMI 32.0-32.9,adult 05/21/2016   Tobacco use disorder, moderate, in sustained remission 04/23/2016   H/O cardiac catheterization 03/20/2015   Coronary artery disease involving native heart with angina pectoris (HCC) 03/19/2015   Essential (primary) hypertension 03/19/2015   Fibrositis 03/19/2015   Gastro-esophageal reflux disease without esophagitis 03/19/2015   Hyperlipidemia, mixed 03/19/2015    Allergies  Allergen Reactions   Azithromycin Diarrhea  Cephalosporins    Ibuprofen Nausea Only   Nsaids Other (See Comments)   Penicillins Other (See Comments)   Sulfa Antibiotics Photosensitivity    Past Surgical History:  Procedure Laterality Date   ABDOMINAL HYSTERECTOMY  1986   BACK SURGERY  08/20/2020   L4 L5 S1 nerve ablation [facility]   CARPAL TUNNEL RELEASE Right    CERVICAL FUSION  2015   CESAREAN SECTION  1984   COLONOSCOPY WITH ESOPHAGOGASTRODUODENOSCOPY (EGD)  08/31/2018   Dr. Earlean Polka   KNEE ARTHROSCOPY Right    PARTIAL  HYSTERECTOMY     ROTATOR CUFF REPAIR Left    ROTATOR CUFF REPAIR W/ DISTAL CLAVICLE EXCISION Right 11/2021    Social History   Tobacco Use   Smoking status: Former    Current packs/day: 0.00    Average packs/day: 0.5 packs/day for 45.0 years (22.5 ttl pk-yrs)    Types: Cigarettes    Start date: 03/14/1967    Quit date: 03/13/2012    Years since quitting: 11.5   Smokeless tobacco: Never  Vaping Use   Vaping status: Never Used  Substance Use Topics   Alcohol use: No    Alcohol/week: 0.0 standard drinks of alcohol   Drug use: No     Medication list has been reviewed and updated.  Current Meds  Medication Sig   aspirin EC 81 MG tablet Take 81 mg by mouth daily. Swallow whole.   ezetimibe (ZETIA) 10 MG tablet Take 10 mg by mouth daily.   meclizine (ANTIVERT) 12.5 MG tablet Take 1 tablet (12.5 mg total) by mouth 3 (three) times daily as needed for dizziness.   metoprolol tartrate (LOPRESSOR) 25 MG tablet Take 25 mg by mouth 2 (two) times daily.    nitroGLYCERIN (NITROSTAT) 0.4 MG SL tablet Place 1 tablet under the tongue daily as needed.   omeprazole (PRILOSEC) 20 MG capsule TAKE 1 CAPSULE TWICE DAILY BEFORE MEALS   rosuvastatin (CRESTOR) 40 MG tablet Take 1 tablet by mouth daily.   triamcinolone (NASACORT) 55 MCG/ACT AERO nasal inhaler Place 2 sprays into the nose daily.   triamterene-hydrochlorothiazide (MAXZIDE-25) 37.5-25 MG tablet Take 1 tablet by mouth daily.       09/28/2023    4:26 PM 04/29/2023    9:02 AM 10/29/2022   11:21 AM 10/09/2022    4:20 PM  GAD 7 : Generalized Anxiety Score  Nervous, Anxious, on Edge 0 0 0 0  Control/stop worrying 0 0 0 0  Worry too much - different things 0 0 0 0  Trouble relaxing 0 0 0 0  Restless 0 0 0 0  Easily annoyed or irritable 0 0 0 0  Afraid - awful might happen 0 0 0 0  Total GAD 7 Score 0 0 0 0  Anxiety Difficulty Not difficult at all Not difficult at all Not difficult at all Not difficult at all       09/28/2023    4:26  PM 09/23/2023    9:54 AM 04/29/2023    9:02 AM  Depression screen PHQ 2/9  Decreased Interest 0 0 0  Down, Depressed, Hopeless 0 0 0  PHQ - 2 Score 0 0 0  Altered sleeping 0 0 3  Tired, decreased energy 0 0 1  Change in appetite 0 0 0  Feeling bad or failure about yourself  0 0 0  Trouble concentrating 0 0 0  Moving slowly or fidgety/restless 0 0 0  Suicidal thoughts 0 0 0  PHQ-9 Score 0 0  4  Difficult doing work/chores Not difficult at all Not difficult at all Not difficult at all    BP Readings from Last 3 Encounters:  09/28/23 120/78  04/29/23 122/78  10/29/22 122/60    Physical Exam Vitals and nursing note reviewed.  Constitutional:      General: She is not in acute distress.    Appearance: She is well-developed.  HENT:     Head: Normocephalic and atraumatic.     Right Ear: Hearing and tympanic membrane normal.     Left Ear: Hearing and tympanic membrane normal.     Nose:     Right Sinus: No maxillary sinus tenderness.     Left Sinus: No maxillary sinus tenderness.     Mouth/Throat:     Mouth: Mucous membranes are moist.     Pharynx: No posterior oropharyngeal erythema.  Eyes:     Extraocular Movements:     Right eye: Nystagmus present.     Left eye: Nystagmus present.  Cardiovascular:     Rate and Rhythm: Normal rate and regular rhythm.     Pulses: Normal pulses.     Heart sounds: Normal heart sounds.  Pulmonary:     Effort: Pulmonary effort is normal. No respiratory distress.     Breath sounds: Normal breath sounds. No wheezing.  Skin:    General: Skin is warm and dry.     Findings: No rash.  Neurological:     Mental Status: She is alert and oriented to person, place, and time.  Psychiatric:        Mood and Affect: Mood normal.        Behavior: Behavior normal.     Wt Readings from Last 3 Encounters:  09/28/23 166 lb (75.3 kg)  04/29/23 167 lb (75.8 kg)  10/29/22 163 lb (73.9 kg)    BP 120/78   Pulse 68   Ht 5\' 2"  (1.575 m)   Wt 166 lb (75.3  kg)   SpO2 97%   BMI 30.36 kg/m   Assessment and Plan:  Problem List Items Addressed This Visit       Unprioritized   Essential (primary) hypertension (Chronic)   Controlled BP with normal exam. Current regimen is metoprolol and hctz. Will continue same medications; encourage continued reduced sodium diet.       Gastro-esophageal reflux disease without esophagitis (Chronic)   With chest pain and normal cardiac work up Scheduled to see GI in January. Continue omeprazole bid      Relevant Medications   meclizine (ANTIVERT) 12.5 MG tablet   Hyperlipidemia, mixed   Now on Crestor and Zetia from Cardiology.      Other Visit Diagnoses       Vertigo    -  Primary   Relevant Medications   meclizine (ANTIVERT) 12.5 MG tablet       No follow-ups on file.    Reubin Milan, MD [facility] [place]

## 2023-09-28 NOTE — Assessment & Plan Note (Signed)
Now on Crestor and Zetia from Cardiology.

## 2023-09-28 NOTE — Telephone Encounter (Signed)
  Chief Complaint: dizziness Symptoms: chest pain  (currently being worked up by cardiologist- awaiting stress test results. 10 lb weight loss, hungry all the time and increased urination at night  Frequency: 2-3 weeks Pertinent Negatives: Patient denies fever, vomiting, diarrhea, bleeding Disposition: [] ED /[] Urgent Care (no appt availability in office) / [] Appointment(In office/virtual)/ []  [place] [facility]/ [] Home Care/ [] Refused Recommended Disposition /[] [facility]/ []  Follow-up with PCP Additional Notes: pt will call back iff can get ride today   Reason for Disposition  [1] MODERATE dizziness (e.g., interferes with normal activities) AND [2] has been evaluated by doctor (or NP/PA) for this  Answer Assessment - Initial Assessment Questions 1. DESCRIPTION: "Describe your dizziness."     Has to hold onto objects and bend head to see  2. LIGHTHEADED: "Do you feel lightheaded?" (e.g., somewhat faint, woozy, weak upon standing)     Not at the moment- sitting- feels like she is going to fall when  3. VERTIGO: "Do you feel like either you or the room is spinning or tilting?" (i.e. vertigo)     no 4. SEVERITY: "How bad is it?"  "Do you feel like you are going to faint?" "Can you stand and walk?"   - MILD: Feels slightly dizzy, but walking normally.   - MODERATE: Feels unsteady when walking, but not falling; interferes with normal activities (e.g., school, work).   - SEVERE: Unable to walk without falling, or requires assistance to walk without falling; feels like passing out now.      moderate 5. ONSET:  "When did the dizziness begin?"     2-3 weeks  6. AGGRAVATING FACTORS: "Does anything make it worse?" (e.g., standing, change in head position)     Standing and change in head position 7. HEART RATE: "Can you tell me your heart rate?" "How many beats in 15 seconds?"  (Note: not all patients can do this)       This am felt heart rate was elevated  8. CAUSE: "What do you  think is causing the dizziness?"     ? Diabetes  9. RECURRENT SYMPTOM: "Have you had dizziness before?" If Yes, ask: "When was the last time?" "What happened that time?"     yes 10. OTHER SYMPTOMS: "Do you have any other symptoms?" (e.g., fever, chest pain, vomiting, diarrhea, bleeding)       10 lbs in 2 weeks, hungry all the time, fatigue, cannot turn head when driving head turning, chest pain burning sometimes up to ears- waiting for stress test  11. PREGNANCY: "Is there any chance you are pregnant?" "When was your last menstrual period?"       N/a  Protocols used: Dizziness - Lightheadedness-A-AH

## 2023-09-28 NOTE — Assessment & Plan Note (Signed)
With chest pain and normal cardiac work up Scheduled to see GI in January. Continue omeprazole bid

## 2023-09-29 ENCOUNTER — Ambulatory Visit: Payer: Medicare PPO | Admitting: Family Medicine

## 2023-10-01 ENCOUNTER — Other Ambulatory Visit: Payer: Self-pay | Admitting: Acute Care

## 2023-10-01 DIAGNOSIS — Z87891 Personal history of nicotine dependence: Secondary | ICD-10-CM

## 2023-10-01 DIAGNOSIS — Z122 Encounter for screening for malignant neoplasm of respiratory organs: Secondary | ICD-10-CM

## 2023-10-02 ENCOUNTER — Other Ambulatory Visit: Payer: Self-pay | Admitting: Internal Medicine

## 2023-10-02 DIAGNOSIS — J4521 Mild intermittent asthma with (acute) exacerbation: Secondary | ICD-10-CM

## 2023-10-02 NOTE — Telephone Encounter (Signed)
Requested medication (s) are due for refill today: review  Requested medication (s) are on the active medication list: no  Last refill:  12/27/22  Future visit scheduled: no  Notes to clinic:  rx was dc'd 04/29/23, review for refill      Requested Prescriptions  Pending Prescriptions Disp Refills   albuterol (VENTOLIN HFA) 108 (90 Base) MCG/ACT inhaler [Pharmacy Med Name: albuterol sulfate HFA 90 mcg/actuation aerosol inhaler] 8.5 g 2    Sig: INHALE TWO PUFFs INTO THE LUNGS EVERY 6 HOURS AS NEEDED FOR WHEEZING OR SHORTNESS OF BREATH     Pulmonology:  Beta Agonists 2 Passed - 10/02/2023  1:47 PM      Passed - Last BP in normal range    BP Readings from Last 1 Encounters:  09/28/23 120/78         Passed - Last Heart Rate in normal range    Pulse Readings from Last 1 Encounters:  09/28/23 68         Passed - Valid encounter within last 12 months    Recent Outpatient Visits           4 days ago Vertigo   [place] [facility], Nyoka Cowden, MD   5 months ago Annual physical exam   [facility], Nyoka Cowden, MD   11 months ago Subacute cough   [place] [facility], [facility]   11 months ago Flu-like symptoms   [place] [facility] at [facility], [facility]   1 year ago Subacute cough   [place] [facility] at [facility], Nyoka Cowden, MD

## 2023-11-11 DIAGNOSIS — I2510 Atherosclerotic heart disease of native coronary artery without angina pectoris: Secondary | ICD-10-CM | POA: Diagnosis not present

## 2023-11-11 DIAGNOSIS — K224 Dyskinesia of esophagus: Secondary | ICD-10-CM | POA: Diagnosis not present

## 2023-11-11 DIAGNOSIS — R0789 Other chest pain: Secondary | ICD-10-CM | POA: Diagnosis not present

## 2023-11-11 DIAGNOSIS — K219 Gastro-esophageal reflux disease without esophagitis: Secondary | ICD-10-CM | POA: Diagnosis not present

## 2023-11-18 DIAGNOSIS — I5032 Chronic diastolic (congestive) heart failure: Secondary | ICD-10-CM | POA: Diagnosis not present

## 2023-11-18 DIAGNOSIS — Z87891 Personal history of nicotine dependence: Secondary | ICD-10-CM | POA: Diagnosis not present

## 2023-11-18 DIAGNOSIS — Z1231 Encounter for screening mammogram for malignant neoplasm of breast: Secondary | ICD-10-CM | POA: Diagnosis not present

## 2023-11-18 DIAGNOSIS — K219 Gastro-esophageal reflux disease without esophagitis: Secondary | ICD-10-CM | POA: Diagnosis not present

## 2023-11-18 DIAGNOSIS — Z1389 Encounter for screening for other disorder: Secondary | ICD-10-CM | POA: Diagnosis not present

## 2023-11-18 DIAGNOSIS — J4540 Moderate persistent asthma, uncomplicated: Secondary | ICD-10-CM | POA: Diagnosis not present

## 2023-11-18 DIAGNOSIS — Z1331 Encounter for screening for depression: Secondary | ICD-10-CM | POA: Diagnosis not present

## 2023-11-18 DIAGNOSIS — I110 Hypertensive heart disease with heart failure: Secondary | ICD-10-CM | POA: Diagnosis not present

## 2023-11-18 DIAGNOSIS — M797 Fibromyalgia: Secondary | ICD-10-CM | POA: Diagnosis not present

## 2023-11-18 DIAGNOSIS — Z780 Asymptomatic menopausal state: Secondary | ICD-10-CM | POA: Diagnosis not present

## 2023-11-18 DIAGNOSIS — Z1211 Encounter for screening for malignant neoplasm of colon: Secondary | ICD-10-CM | POA: Diagnosis not present

## 2023-11-18 DIAGNOSIS — I25118 Atherosclerotic heart disease of native coronary artery with other forms of angina pectoris: Secondary | ICD-10-CM | POA: Diagnosis not present

## 2023-11-18 DIAGNOSIS — Z1339 Encounter for screening examination for other mental health and behavioral disorders: Secondary | ICD-10-CM | POA: Diagnosis not present

## 2023-11-18 DIAGNOSIS — Z7982 Long term (current) use of aspirin: Secondary | ICD-10-CM | POA: Diagnosis not present

## 2023-11-18 DIAGNOSIS — Z7689 Persons encountering health services in other specified circumstances: Secondary | ICD-10-CM | POA: Diagnosis not present

## 2023-11-25 DIAGNOSIS — M1990 Unspecified osteoarthritis, unspecified site: Secondary | ICD-10-CM | POA: Diagnosis not present

## 2023-11-25 DIAGNOSIS — K2970 Gastritis, unspecified, without bleeding: Secondary | ICD-10-CM | POA: Diagnosis not present

## 2023-11-25 DIAGNOSIS — Z87891 Personal history of nicotine dependence: Secondary | ICD-10-CM | POA: Diagnosis not present

## 2023-11-25 DIAGNOSIS — I2510 Atherosclerotic heart disease of native coronary artery without angina pectoris: Secondary | ICD-10-CM | POA: Diagnosis not present

## 2023-11-25 DIAGNOSIS — E785 Hyperlipidemia, unspecified: Secondary | ICD-10-CM | POA: Diagnosis not present

## 2023-11-25 DIAGNOSIS — K219 Gastro-esophageal reflux disease without esophagitis: Secondary | ICD-10-CM | POA: Diagnosis not present

## 2023-11-25 DIAGNOSIS — K222 Esophageal obstruction: Secondary | ICD-10-CM | POA: Diagnosis not present

## 2023-11-25 DIAGNOSIS — I10 Essential (primary) hypertension: Secondary | ICD-10-CM | POA: Diagnosis not present

## 2023-11-25 DIAGNOSIS — K224 Dyskinesia of esophagus: Secondary | ICD-10-CM | POA: Diagnosis not present

## 2023-12-08 ENCOUNTER — Ambulatory Visit
Admission: RE | Admit: 2023-12-08 | Discharge: 2023-12-08 | Disposition: A | Discharge status: Home / Self Care | Payer: Medicare PPO | Source: Ambulatory Visit | Attending: Family Medicine | Admitting: Family Medicine

## 2023-12-08 DIAGNOSIS — Z122 Encounter for screening for malignant neoplasm of respiratory organs: Secondary | ICD-10-CM | POA: Diagnosis not present

## 2023-12-08 DIAGNOSIS — Z87891 Personal history of nicotine dependence: Secondary | ICD-10-CM | POA: Diagnosis not present

## 2024-01-04 ENCOUNTER — Other Ambulatory Visit: Payer: Self-pay | Admitting: Acute Care

## 2024-01-04 DIAGNOSIS — Z87891 Personal history of nicotine dependence: Secondary | ICD-10-CM

## 2024-01-04 DIAGNOSIS — Z122 Encounter for screening for malignant neoplasm of respiratory organs: Secondary | ICD-10-CM

## 2024-01-25 ENCOUNTER — Other Ambulatory Visit: Payer: Self-pay | Admitting: Internal Medicine

## 2024-01-25 DIAGNOSIS — I10 Essential (primary) hypertension: Secondary | ICD-10-CM

## 2024-01-26 NOTE — Telephone Encounter (Signed)
 Requested Prescriptions  Refused Prescriptions Disp Refills   triamterene-hydrochlorothiazide (MAXZIDE-25) 37.5-25 MG tablet [Pharmacy Med Name: triamterene 37.5 mg-hydrochlorothiazide 25 mg tablet] 90 tablet 3    Sig: TAKE ONE TABLET BY MOUTH ONCE DAILY     Cardiovascular: Diuretic Combos Failed - 01/26/2024 11:00 AM      Failed - K in normal range and within 180 days    Potassium  Date Value Ref Range Status  04/29/2023 3.7 3.5 - 5.2 mmol/L Final         Failed - Na in normal range and within 180 days    Sodium  Date Value Ref Range Status  04/29/2023 141 134 - 144 mmol/L Final         Failed - Cr in normal range and within 180 days    Creatinine, Ser  Date Value Ref Range Status  04/29/2023 0.79 0.57 - 1.00 mg/dL Final         Failed - Valid encounter within last 6 months    Recent Outpatient Visits   None            Passed - Last BP in normal range    BP Readings from Last 1 Encounters:  09/28/23 120/78

## 2024-03-04 ENCOUNTER — Other Ambulatory Visit: Payer: Self-pay | Admitting: Internal Medicine

## 2024-03-04 DIAGNOSIS — K219 Gastro-esophageal reflux disease without esophagitis: Secondary | ICD-10-CM

## 2024-03-08 NOTE — Telephone Encounter (Signed)
 Requested Prescriptions  Pending Prescriptions Disp Refills   omeprazole  (PRILOSEC) 20 MG capsule [Pharmacy Med Name: omeprazole  20 mg capsule,delayed release] 180 capsule 0    Sig: TAKE ONE CAPSULE BY MOUTH TWICE DAILY WITH MEALS     Gastroenterology: Proton Pump Inhibitors Failed - 03/08/2024 11:12 AM      Failed - Valid encounter within last 12 months    Recent Outpatient Visits   None

## 2024-04-18 ENCOUNTER — Other Ambulatory Visit: Payer: Self-pay | Admitting: Internal Medicine

## 2024-04-18 DIAGNOSIS — I10 Essential (primary) hypertension: Secondary | ICD-10-CM

## 2024-04-20 NOTE — Telephone Encounter (Signed)
 Requested Prescriptions  Pending Prescriptions Disp Refills   triamterene -hydrochlorothiazide (MAXZIDE-25) 37.5-25 MG tablet [Pharmacy Med Name: triamterene  37.5 mg-hydrochlorothiazide 25 mg tablet] 90 tablet 1    Sig: TAKE ONE TABLET BY MOUTH ONCE DAILY     Cardiovascular: Diuretic Combos Failed - 04/20/2024 12:18 PM      Failed - K in normal range and within 180 days    Potassium  Date Value Ref Range Status  04/29/2023 3.7 3.5 - 5.2 mmol/L Final         Failed - Na in normal range and within 180 days    Sodium  Date Value Ref Range Status  04/29/2023 141 134 - 144 mmol/L Final         Failed - Cr in normal range and within 180 days    Creatinine, Ser  Date Value Ref Range Status  04/29/2023 0.79 0.57 - 1.00 mg/dL Final         Failed - Valid encounter within last 6 months    Recent Outpatient Visits   None            Passed - Last BP in normal range    BP Readings from Last 1 Encounters:  09/28/23 120/78

## 2024-06-02 ENCOUNTER — Other Ambulatory Visit: Payer: Self-pay | Admitting: Internal Medicine

## 2024-06-02 DIAGNOSIS — K219 Gastro-esophageal reflux disease without esophagitis: Secondary | ICD-10-CM

## 2024-06-03 NOTE — Telephone Encounter (Signed)
 OFFICE VISIT NEEDED FOR ADDITIONAL REFILLS.  Requested Prescriptions  Pending Prescriptions Disp Refills   omeprazole  (PRILOSEC) 20 MG capsule [Pharmacy Med Name: omeprazole  20 mg capsule,delayed release] 60 capsule 0    Sig: TAKE ONE CAPSULE BY MOUTH TWICE DAILY WITH MEALS     Gastroenterology: Proton Pump Inhibitors Failed - 06/03/2024  9:57 AM      Failed - Valid encounter within last 12 months    Recent Outpatient Visits   None

## 2024-07-14 ENCOUNTER — Other Ambulatory Visit: Payer: Self-pay | Admitting: Internal Medicine

## 2024-07-14 DIAGNOSIS — K219 Gastro-esophageal reflux disease without esophagitis: Secondary | ICD-10-CM

## 2024-07-15 NOTE — Telephone Encounter (Signed)
 Requested medication (s) are due for refill today: yes  Requested medication (s) are on the active medication list: yes  Last refill:  06/03/24  Future visit scheduled: no  Notes to clinic:  Unable to refill per protocol, courtesy refill already given, routing for provider approval.      Requested Prescriptions  Pending Prescriptions Disp Refills   omeprazole  (PRILOSEC) 20 MG capsule [Pharmacy Med Name: omeprazole  20 mg capsule,delayed release] 60 capsule 0    Sig: TAKE ONE CAPSULE BY MOUTH TWICE DAILY WITH MEALS     Gastroenterology: Proton Pump Inhibitors Failed - 07/15/2024  1:01 PM      Failed - Valid encounter within last 12 months    Recent Outpatient Visits   None

## 2024-10-01 ENCOUNTER — Other Ambulatory Visit: Payer: Self-pay | Admitting: Internal Medicine

## 2024-10-01 DIAGNOSIS — I10 Essential (primary) hypertension: Secondary | ICD-10-CM

## 2024-10-05 NOTE — Telephone Encounter (Signed)
 OFFICE VISIT NEEDED FOR ADDITIONAL REFILLS.  Requested Prescriptions  Pending Prescriptions Disp Refills   triamterene -hydrochlorothiazide (MAXZIDE-25) 37.5-25 MG tablet [Pharmacy Med Name: triamterene  37.5 mg-hydrochlorothiazide 25 mg tablet] 30 tablet 0    Sig: TAKE ONE TABLET BY MOUTH ONCE DAILY     Cardiovascular: Diuretic Combos Failed - 10/05/2024  9:15 AM      Failed - K in normal range and within 180 days    Potassium  Date Value Ref Range Status  04/29/2023 3.7 3.5 - 5.2 mmol/L Final         Failed - Na in normal range and within 180 days    Sodium  Date Value Ref Range Status  04/29/2023 141 134 - 144 mmol/L Final         Failed - Cr in normal range and within 180 days    Creatinine, Ser  Date Value Ref Range Status  04/29/2023 0.79 0.57 - 1.00 mg/dL Final         Failed - Valid encounter within last 6 months    Recent Outpatient Visits   None            Passed - Last BP in normal range    BP Readings from Last 1 Encounters:  09/28/23 120/78

## 2024-10-14 ENCOUNTER — Other Ambulatory Visit: Payer: Self-pay | Admitting: Internal Medicine

## 2024-10-14 DIAGNOSIS — I10 Essential (primary) hypertension: Secondary | ICD-10-CM

## 2024-10-14 NOTE — Telephone Encounter (Signed)
 Requested medications are due for refill today.  yes  Requested medications are on the active medications list.  yes  Last refill. 10/05/2024 #30 0 rf  Future visit scheduled.   no  Notes to clinic.  Courtesy refill given. Labs are expired, pt needs an appt.    Requested Prescriptions  Pending Prescriptions Disp Refills   triamterene -hydrochlorothiazide (MAXZIDE-25) 37.5-25 MG tablet [Pharmacy Med Name: triamterene  37.5 mg-hydrochlorothiazide 25 mg tablet] 30 tablet 0    Sig: TAKE ONE TABLET BY MOUTH ONCE DAILY     Cardiovascular: Diuretic Combos Failed - 10/14/2024  5:24 PM      Failed - K in normal range and within 180 days    Potassium  Date Value Ref Range Status  04/29/2023 3.7 3.5 - 5.2 mmol/L Final         Failed - Na in normal range and within 180 days    Sodium  Date Value Ref Range Status  04/29/2023 141 134 - 144 mmol/L Final         Failed - Cr in normal range and within 180 days    Creatinine, Ser  Date Value Ref Range Status  04/29/2023 0.79 0.57 - 1.00 mg/dL Final         Failed - Valid encounter within last 6 months    Recent Outpatient Visits   None            Passed - Last BP in normal range    BP Readings from Last 1 Encounters:  09/28/23 120/78
